# Patient Record
Sex: Female | Born: 1935 | Race: Black or African American | Hispanic: No | Marital: Single | Smoking: Former smoker
Health system: Southern US, Community
[De-identification: ages and names within clinical notes are randomized; demographics above are authoritative.]

## PROBLEM LIST (undated history)

## (undated) DIAGNOSIS — R51 Headache: Secondary | ICD-10-CM

## (undated) DIAGNOSIS — F419 Anxiety disorder, unspecified: Secondary | ICD-10-CM

## (undated) DIAGNOSIS — R519 Headache, unspecified: Secondary | ICD-10-CM

## (undated) DIAGNOSIS — I10 Essential (primary) hypertension: Secondary | ICD-10-CM

## (undated) DIAGNOSIS — M1990 Unspecified osteoarthritis, unspecified site: Secondary | ICD-10-CM

## (undated) DIAGNOSIS — E119 Type 2 diabetes mellitus without complications: Secondary | ICD-10-CM

## (undated) DIAGNOSIS — E7800 Pure hypercholesterolemia, unspecified: Secondary | ICD-10-CM

## (undated) HISTORY — PX: OTHER SURGICAL HISTORY: SHX169

## (undated) HISTORY — PX: CARDIAC CATHETERIZATION: SHX172

---

## 1998-02-11 ENCOUNTER — Emergency Department (HOSPITAL_COMMUNITY): Admission: EM | Admit: 1998-02-11 | Discharge: 1998-02-11 | Payer: Self-pay | Admitting: Emergency Medicine

## 1998-02-22 ENCOUNTER — Encounter: Admission: RE | Admit: 1998-02-22 | Discharge: 1998-05-23 | Payer: Self-pay | Admitting: Family Medicine

## 1998-03-20 ENCOUNTER — Emergency Department (HOSPITAL_COMMUNITY): Admission: EM | Admit: 1998-03-20 | Discharge: 1998-03-20 | Payer: Self-pay | Admitting: Emergency Medicine

## 2000-10-20 ENCOUNTER — Encounter: Payer: Self-pay | Admitting: Emergency Medicine

## 2000-10-20 ENCOUNTER — Emergency Department (HOSPITAL_COMMUNITY): Admission: EM | Admit: 2000-10-20 | Discharge: 2000-10-20 | Payer: Self-pay | Admitting: Emergency Medicine

## 2005-05-25 ENCOUNTER — Emergency Department (HOSPITAL_COMMUNITY): Admission: EM | Admit: 2005-05-25 | Discharge: 2005-05-25 | Payer: Self-pay | Admitting: Emergency Medicine

## 2005-06-26 ENCOUNTER — Emergency Department (HOSPITAL_COMMUNITY): Admission: EM | Admit: 2005-06-26 | Discharge: 2005-06-26 | Payer: Self-pay | Admitting: Family Medicine

## 2005-06-29 ENCOUNTER — Emergency Department (HOSPITAL_COMMUNITY): Admission: EM | Admit: 2005-06-29 | Discharge: 2005-06-29 | Payer: Self-pay | Admitting: Family Medicine

## 2005-09-07 ENCOUNTER — Emergency Department (HOSPITAL_COMMUNITY): Admission: EM | Admit: 2005-09-07 | Discharge: 2005-09-07 | Payer: Self-pay | Admitting: Family Medicine

## 2006-01-03 ENCOUNTER — Emergency Department (HOSPITAL_COMMUNITY): Admission: EM | Admit: 2006-01-03 | Discharge: 2006-01-03 | Payer: Self-pay | Admitting: Emergency Medicine

## 2006-06-19 ENCOUNTER — Emergency Department (HOSPITAL_COMMUNITY): Admission: EM | Admit: 2006-06-19 | Discharge: 2006-06-19 | Payer: Self-pay | Admitting: Emergency Medicine

## 2006-08-17 ENCOUNTER — Emergency Department (HOSPITAL_COMMUNITY): Admission: EM | Admit: 2006-08-17 | Discharge: 2006-08-17 | Payer: Self-pay | Admitting: Family Medicine

## 2006-08-18 ENCOUNTER — Emergency Department (HOSPITAL_COMMUNITY): Admission: EM | Admit: 2006-08-18 | Discharge: 2006-08-18 | Payer: Self-pay | Admitting: Emergency Medicine

## 2006-08-19 ENCOUNTER — Encounter (INDEPENDENT_AMBULATORY_CARE_PROVIDER_SITE_OTHER): Payer: Self-pay | Admitting: *Deleted

## 2006-08-19 ENCOUNTER — Ambulatory Visit (HOSPITAL_COMMUNITY): Admission: RE | Admit: 2006-08-19 | Discharge: 2006-08-19 | Payer: Self-pay | Admitting: Emergency Medicine

## 2006-08-19 ENCOUNTER — Ambulatory Visit: Payer: Self-pay | Admitting: *Deleted

## 2006-11-29 ENCOUNTER — Ambulatory Visit: Payer: Self-pay | Admitting: *Deleted

## 2006-11-29 ENCOUNTER — Emergency Department (HOSPITAL_COMMUNITY): Admission: EM | Admit: 2006-11-29 | Discharge: 2006-11-29 | Payer: Self-pay | Admitting: Emergency Medicine

## 2006-12-20 ENCOUNTER — Encounter: Admission: RE | Admit: 2006-12-20 | Discharge: 2006-12-20 | Payer: Self-pay | Admitting: Orthopedic Surgery

## 2007-01-07 ENCOUNTER — Encounter: Admission: RE | Admit: 2007-01-07 | Discharge: 2007-01-07 | Payer: Self-pay | Admitting: Orthopedic Surgery

## 2007-01-15 ENCOUNTER — Encounter: Admission: RE | Admit: 2007-01-15 | Discharge: 2007-01-15 | Payer: Self-pay | Admitting: Orthopedic Surgery

## 2007-03-11 ENCOUNTER — Emergency Department (HOSPITAL_COMMUNITY): Admission: EM | Admit: 2007-03-11 | Discharge: 2007-03-11 | Payer: Self-pay | Admitting: Emergency Medicine

## 2007-07-13 ENCOUNTER — Emergency Department (HOSPITAL_COMMUNITY): Admission: EM | Admit: 2007-07-13 | Discharge: 2007-07-13 | Payer: Self-pay | Admitting: Emergency Medicine

## 2007-08-25 ENCOUNTER — Emergency Department (HOSPITAL_COMMUNITY): Admission: EM | Admit: 2007-08-25 | Discharge: 2007-08-25 | Payer: Self-pay | Admitting: Emergency Medicine

## 2007-12-09 ENCOUNTER — Emergency Department (HOSPITAL_BASED_OUTPATIENT_CLINIC_OR_DEPARTMENT_OTHER): Admission: EM | Admit: 2007-12-09 | Discharge: 2007-12-09 | Payer: Self-pay | Admitting: Emergency Medicine

## 2007-12-16 ENCOUNTER — Emergency Department (HOSPITAL_BASED_OUTPATIENT_CLINIC_OR_DEPARTMENT_OTHER): Admission: EM | Admit: 2007-12-16 | Discharge: 2007-12-16 | Payer: Self-pay | Admitting: Emergency Medicine

## 2008-01-20 ENCOUNTER — Emergency Department (HOSPITAL_COMMUNITY): Admission: EM | Admit: 2008-01-20 | Discharge: 2008-01-20 | Payer: Self-pay | Admitting: Emergency Medicine

## 2008-01-22 ENCOUNTER — Encounter: Admission: RE | Admit: 2008-01-22 | Discharge: 2008-01-22 | Payer: Self-pay | Admitting: Orthopedic Surgery

## 2008-06-26 ENCOUNTER — Emergency Department (HOSPITAL_COMMUNITY): Admission: EM | Admit: 2008-06-26 | Discharge: 2008-06-26 | Payer: Self-pay | Admitting: Emergency Medicine

## 2008-07-23 ENCOUNTER — Encounter: Admission: RE | Admit: 2008-07-23 | Discharge: 2008-07-23 | Payer: Self-pay | Admitting: Orthopedic Surgery

## 2008-12-23 ENCOUNTER — Emergency Department (HOSPITAL_COMMUNITY): Admission: EM | Admit: 2008-12-23 | Discharge: 2008-12-24 | Payer: Self-pay | Admitting: Emergency Medicine

## 2009-01-29 ENCOUNTER — Emergency Department (HOSPITAL_COMMUNITY): Admission: EM | Admit: 2009-01-29 | Discharge: 2009-01-30 | Payer: Self-pay | Admitting: Emergency Medicine

## 2009-03-15 ENCOUNTER — Emergency Department (HOSPITAL_COMMUNITY): Admission: EM | Admit: 2009-03-15 | Discharge: 2009-03-15 | Payer: Self-pay | Admitting: Emergency Medicine

## 2009-03-29 ENCOUNTER — Emergency Department (HOSPITAL_COMMUNITY): Admission: EM | Admit: 2009-03-29 | Discharge: 2009-03-29 | Payer: Self-pay | Admitting: Emergency Medicine

## 2009-06-07 ENCOUNTER — Emergency Department (HOSPITAL_COMMUNITY): Admission: EM | Admit: 2009-06-07 | Discharge: 2009-06-07 | Payer: Self-pay | Admitting: Emergency Medicine

## 2009-08-23 ENCOUNTER — Emergency Department (HOSPITAL_COMMUNITY): Admission: EM | Admit: 2009-08-23 | Discharge: 2009-08-24 | Payer: Self-pay | Admitting: Emergency Medicine

## 2009-10-01 ENCOUNTER — Emergency Department (HOSPITAL_COMMUNITY): Admission: EM | Admit: 2009-10-01 | Discharge: 2009-10-01 | Payer: Self-pay | Admitting: Emergency Medicine

## 2009-10-31 ENCOUNTER — Inpatient Hospital Stay (HOSPITAL_COMMUNITY): Admission: EM | Admit: 2009-10-31 | Discharge: 2009-11-02 | Payer: Self-pay | Admitting: Emergency Medicine

## 2010-08-21 LAB — BLOOD GAS, VENOUS
Acid-Base Excess: 0.2 mmol/L (ref 0.0–2.0)
O2 Saturation: 78 %
TCO2: 22.3 mmol/L (ref 0–100)
pO2, Ven: 45.8 mmHg — ABNORMAL HIGH (ref 30.0–45.0)

## 2010-08-21 LAB — DIFFERENTIAL
Eosinophils Absolute: 0.2 10*3/uL (ref 0.0–0.7)
Monocytes Relative: 7 % (ref 3–12)
Neutrophils Relative %: 43 % (ref 43–77)

## 2010-08-21 LAB — COMPREHENSIVE METABOLIC PANEL
ALT: 13 U/L (ref 0–35)
ALT: 17 U/L (ref 0–35)
AST: 20 U/L (ref 0–37)
Albumin: 3.6 g/dL (ref 3.5–5.2)
Alkaline Phosphatase: 59 U/L (ref 39–117)
BUN: 26 mg/dL — ABNORMAL HIGH (ref 6–23)
CO2: 22 mEq/L (ref 19–32)
CO2: 24 mEq/L (ref 19–32)
Calcium: 8.9 mg/dL (ref 8.4–10.5)
GFR calc non Af Amer: 36 mL/min — ABNORMAL LOW (ref >60)
Glucose, Bld: 285 mg/dL — ABNORMAL HIGH (ref 70–99)
Potassium: 3.7 mEq/L (ref 3.5–5.1)
Sodium: 135 mEq/L (ref 135–145)
Total Bilirubin: 0.7 mg/dL (ref 0.3–1.2)
Total Bilirubin: 0.8 mg/dL (ref 0.3–1.2)
Total Protein: 6 g/dL (ref 6.0–8.3)

## 2010-08-21 LAB — PROTIME-INR: INR: 0.91 (ref 0.00–1.49)

## 2010-08-21 LAB — LIPID PANEL
Total CHOL/HDL Ratio: 6 RATIO
VLDL: 32 mg/dL (ref 0–40)

## 2010-08-21 LAB — URINALYSIS, ROUTINE W REFLEX MICROSCOPIC
Glucose, UA: >1000 mg/dL — AB
Hgb urine dipstick: NEGATIVE
Leukocytes, UA: NEGATIVE
Specific Gravity, Urine: 1.025 (ref 1.005–1.030)

## 2010-08-21 LAB — GLUCOSE, CAPILLARY
Glucose-Capillary: 257 mg/dL — ABNORMAL HIGH (ref 70–99)
Glucose-Capillary: 272 mg/dL — ABNORMAL HIGH (ref 70–99)
Glucose-Capillary: 278 mg/dL — ABNORMAL HIGH (ref 70–99)
Glucose-Capillary: 478 mg/dL — ABNORMAL HIGH (ref 70–99)
Glucose-Capillary: 82 mg/dL (ref 70–99)
Glucose-Capillary: 95 mg/dL (ref 70–99)

## 2010-08-21 LAB — POCT I-STAT, CHEM 8
Calcium, Ion: 1.01 mmol/L — ABNORMAL LOW (ref 1.12–1.32)
Creatinine, Ser: 1.9 mg/dL — ABNORMAL HIGH (ref 0.4–1.2)
Glucose, Bld: 432 mg/dL — ABNORMAL HIGH (ref 70–99)
HCT: 42 % (ref 36.0–46.0)
Hemoglobin: 14.3 g/dL (ref 12.0–15.0)
Potassium: 3.8 mEq/L (ref 3.5–5.1)
TCO2: 23 mmol/L (ref 0–100)

## 2010-08-21 LAB — CBC
HCT: 33.7 % — ABNORMAL LOW (ref 36.0–46.0)
HCT: 38.7 % (ref 36.0–46.0)
Hemoglobin: 11.1 g/dL — ABNORMAL LOW (ref 12.0–15.0)
Hemoglobin: 13 g/dL (ref 12.0–15.0)
MCHC: 33.6 g/dL (ref 30.0–36.0)
MCV: 88.6 fL (ref 78.0–100.0)
RBC: 3.74 MIL/uL — ABNORMAL LOW (ref 3.87–5.11)
RDW: 14.2 % (ref 11.5–15.5)
WBC: 11.4 10*3/uL — ABNORMAL HIGH (ref 4.0–10.5)

## 2010-08-21 LAB — BASIC METABOLIC PANEL
CO2: 24 mEq/L (ref 19–32)
Chloride: 107 mEq/L (ref 96–112)
Creatinine, Ser: 1.24 mg/dL — ABNORMAL HIGH (ref 0.4–1.2)
GFR calc Af Amer: 51 mL/min — ABNORMAL LOW (ref >60)
Sodium: 136 mEq/L (ref 135–145)

## 2010-08-21 LAB — URINE CULTURE: Colony Count: 9000

## 2010-08-21 LAB — URINE MICROSCOPIC-ADD ON: Urine-Other: NONE SEEN

## 2010-08-21 LAB — HEMOGLOBIN A1C
Hgb A1c MFr Bld: 11 % — ABNORMAL HIGH (ref <5.7)
Mean Plasma Glucose: 269 mg/dL — ABNORMAL HIGH (ref <117)

## 2010-08-21 LAB — APTT: aPTT: 35 seconds (ref 24–37)

## 2010-08-22 LAB — GLUCOSE, CAPILLARY

## 2010-08-22 LAB — URIC ACID: Uric Acid, Serum: 9.8 mg/dL — ABNORMAL HIGH (ref 2.4–7.0)

## 2010-08-27 LAB — GLUCOSE, CAPILLARY

## 2010-09-07 LAB — CBC
HCT: 35.7 % — ABNORMAL LOW (ref 36.0–46.0)
HCT: 33.7 % — ABNORMAL LOW (ref 36.0–46.0)
Hemoglobin: 11.9 g/dL — ABNORMAL LOW (ref 12.0–15.0)
Hemoglobin: 11.3 g/dL — ABNORMAL LOW (ref 12.0–15.0)
MCHC: 33.5 g/dL (ref 30.0–36.0)
MCV: 90.6 fL (ref 78.0–100.0)
MCV: 90.7 fL (ref 78.0–100.0)
Platelets: 194 10*3/uL (ref 150–400)
RBC: 3.72 MIL/uL — ABNORMAL LOW (ref 3.87–5.11)
RDW: 14.7 % (ref 11.5–15.5)

## 2010-09-07 LAB — URINALYSIS, ROUTINE W REFLEX MICROSCOPIC
Bilirubin Urine: NEGATIVE
Bilirubin Urine: NEGATIVE
Ketones, ur: NEGATIVE mg/dL
Ketones, ur: NEGATIVE mg/dL
Nitrite: NEGATIVE
Nitrite: NEGATIVE
Protein, ur: NEGATIVE mg/dL
Specific Gravity, Urine: 1.02 (ref 1.005–1.030)
Urobilinogen, UA: 0.2 mg/dL (ref 0.0–1.0)
Urobilinogen, UA: 1 mg/dL (ref 0.0–1.0)

## 2010-09-07 LAB — COMPREHENSIVE METABOLIC PANEL
ALT: 11 U/L (ref 0–35)
BUN: 21 mg/dL (ref 6–23)
CO2: 28 mEq/L (ref 19–32)
Calcium: 8.8 mg/dL (ref 8.4–10.5)
GFR calc non Af Amer: 46 mL/min — ABNORMAL LOW (ref >60)
Glucose, Bld: 113 mg/dL — ABNORMAL HIGH (ref 70–99)
Sodium: 139 mEq/L (ref 135–145)

## 2010-09-07 LAB — URINE MICROSCOPIC-ADD ON

## 2010-09-07 LAB — URINE CULTURE: Culture: NO GROWTH

## 2010-09-07 LAB — DIFFERENTIAL
Basophils Absolute: 0 10*3/uL (ref 0.0–0.1)
Basophils Relative: 0 % (ref 0–1)
Eosinophils Absolute: 0.3 10*3/uL (ref 0.0–0.7)
Eosinophils Absolute: 0.3 10*3/uL (ref 0.0–0.7)
Eosinophils Relative: 4 % (ref 0–5)
Lymphocytes Relative: 44 % (ref 12–46)
Lymphs Abs: 3 10*3/uL (ref 0.7–4.0)
Lymphs Abs: 4.2 10*3/uL — ABNORMAL HIGH (ref 0.7–4.0)
Monocytes Absolute: 0.4 10*3/uL (ref 0.1–1.0)
Neutro Abs: 4.4 10*3/uL (ref 1.7–7.7)
Neutrophils Relative %: 46 % (ref 43–77)

## 2010-09-07 LAB — POCT I-STAT, CHEM 8
BUN: 31 mg/dL — ABNORMAL HIGH (ref 6–23)
Calcium, Ion: 1.18 mmol/L (ref 1.12–1.32)
Creatinine, Ser: 1.3 mg/dL — ABNORMAL HIGH (ref 0.4–1.2)
Hemoglobin: 12.6 g/dL (ref 12.0–15.0)
Sodium: 139 mEq/L (ref 135–145)
TCO2: 27 mmol/L (ref 0–100)

## 2010-09-07 LAB — POCT CARDIAC MARKERS: Myoglobin, poc: 68.7 ng/mL (ref 12–200)

## 2010-09-09 LAB — DIFFERENTIAL
Eosinophils Relative: 3 % (ref 0–5)
Lymphocytes Relative: 49 % — ABNORMAL HIGH (ref 12–46)
Lymphs Abs: 3.9 10*3/uL (ref 0.7–4.0)
Monocytes Absolute: 0.5 10*3/uL (ref 0.1–1.0)
Monocytes Relative: 6 % (ref 3–12)

## 2010-09-09 LAB — URINALYSIS, ROUTINE W REFLEX MICROSCOPIC
Glucose, UA: NEGATIVE mg/dL
Hgb urine dipstick: NEGATIVE
Ketones, ur: NEGATIVE mg/dL
Protein, ur: NEGATIVE mg/dL

## 2010-09-09 LAB — CBC
HCT: 36.5 % (ref 36.0–46.0)
Hemoglobin: 12.2 g/dL (ref 12.0–15.0)
WBC: 8.1 10*3/uL (ref 4.0–10.5)

## 2010-09-09 LAB — POCT I-STAT, CHEM 8
BUN: 30 mg/dL — ABNORMAL HIGH (ref 6–23)
Calcium, Ion: 1.14 mmol/L (ref 1.12–1.32)
Creatinine, Ser: 1.6 mg/dL — ABNORMAL HIGH (ref 0.4–1.2)
TCO2: 27 mmol/L (ref 0–100)

## 2010-09-09 LAB — URINE CULTURE

## 2010-09-09 LAB — URINE MICROSCOPIC-ADD ON

## 2010-09-10 LAB — URINALYSIS, ROUTINE W REFLEX MICROSCOPIC
Hgb urine dipstick: NEGATIVE
Protein, ur: NEGATIVE mg/dL
Urobilinogen, UA: 0.2 mg/dL (ref 0.0–1.0)

## 2010-09-10 LAB — URINE MICROSCOPIC-ADD ON

## 2010-09-10 LAB — POCT I-STAT, CHEM 8
BUN: 30 mg/dL — ABNORMAL HIGH (ref 6–23)
Chloride: 107 mEq/L (ref 96–112)
Creatinine, Ser: 1.3 mg/dL — ABNORMAL HIGH (ref 0.4–1.2)
HCT: 38 % (ref 36.0–46.0)
Potassium: 3.5 mEq/L (ref 3.5–5.1)
Sodium: 141 mEq/L (ref 135–145)

## 2010-09-10 LAB — DIFFERENTIAL
Lymphocytes Relative: 52 % — ABNORMAL HIGH (ref 12–46)
Lymphs Abs: 4.3 10*3/uL — ABNORMAL HIGH (ref 0.7–4.0)
Neutro Abs: 3.1 10*3/uL (ref 1.7–7.7)
Neutrophils Relative %: 37 % — ABNORMAL LOW (ref 43–77)

## 2010-09-10 LAB — CBC
HCT: 36.1 % (ref 36.0–46.0)
Platelets: 206 10*3/uL (ref 150–400)
WBC: 8.4 10*3/uL (ref 4.0–10.5)

## 2010-09-10 LAB — URINE CULTURE

## 2010-09-10 LAB — GLUCOSE, CAPILLARY: Glucose-Capillary: 92 mg/dL (ref 70–99)

## 2010-09-18 LAB — URINALYSIS, ROUTINE W REFLEX MICROSCOPIC
Ketones, ur: NEGATIVE mg/dL
Nitrite: NEGATIVE
Protein, ur: NEGATIVE mg/dL

## 2010-09-18 LAB — BASIC METABOLIC PANEL
BUN: 26 mg/dL — ABNORMAL HIGH (ref 6–23)
Creatinine, Ser: 1.16 mg/dL (ref 0.4–1.2)
GFR calc non Af Amer: 46 mL/min — ABNORMAL LOW (ref >60)
Glucose, Bld: 105 mg/dL — ABNORMAL HIGH (ref 70–99)
Potassium: 3.8 mEq/L (ref 3.5–5.1)

## 2010-09-18 LAB — CBC
HCT: 37 % (ref 36.0–46.0)
MCV: 89.2 fL (ref 78.0–100.0)
Platelets: 234 10*3/uL (ref 150–400)
RDW: 15.4 % (ref 11.5–15.5)

## 2010-09-18 LAB — DIFFERENTIAL
Basophils Absolute: 0 10*3/uL (ref 0.0–0.1)
Eosinophils Absolute: 0.1 10*3/uL (ref 0.0–0.7)
Eosinophils Relative: 2 % (ref 0–5)
Lymphocytes Relative: 39 % (ref 12–46)

## 2011-03-01 LAB — CBC
Platelets: 231
RDW: 14.1

## 2011-03-01 LAB — DIFFERENTIAL
Basophils Absolute: 0
Lymphocytes Relative: 33
Neutro Abs: 5.1

## 2011-03-21 LAB — BASIC METABOLIC PANEL
Chloride: 104
Creatinine, Ser: 1.09
GFR calc Af Amer: 60 — ABNORMAL LOW
Potassium: 3.7

## 2011-03-21 LAB — CBC
HCT: 34.5 — ABNORMAL LOW
MCV: 86.2
RBC: 4
WBC: 9.6

## 2011-03-21 LAB — DIFFERENTIAL
Eosinophils Absolute: 0.2
Lymphocytes Relative: 33
Lymphs Abs: 3.1
Monocytes Relative: 10
Neutrophils Relative %: 56

## 2012-01-03 ENCOUNTER — Other Ambulatory Visit: Payer: Self-pay | Admitting: Family Medicine

## 2012-01-03 DIAGNOSIS — Z780 Asymptomatic menopausal state: Secondary | ICD-10-CM

## 2012-01-03 DIAGNOSIS — Z1231 Encounter for screening mammogram for malignant neoplasm of breast: Secondary | ICD-10-CM

## 2012-01-29 ENCOUNTER — Ambulatory Visit
Admission: RE | Admit: 2012-01-29 | Discharge: 2012-01-29 | Disposition: A | Discharge status: Home / Self Care | Payer: Medicare Other | Source: Ambulatory Visit | Attending: Family Medicine | Admitting: Family Medicine

## 2012-01-29 DIAGNOSIS — Z1231 Encounter for screening mammogram for malignant neoplasm of breast: Secondary | ICD-10-CM

## 2012-01-29 DIAGNOSIS — Z780 Asymptomatic menopausal state: Secondary | ICD-10-CM

## 2013-02-01 ENCOUNTER — Emergency Department (HOSPITAL_COMMUNITY)
Admission: EM | Admit: 2013-02-01 | Discharge: 2013-02-01 | Disposition: A | Discharge status: Home / Self Care | Payer: PRIVATE HEALTH INSURANCE | Attending: Emergency Medicine | Admitting: Emergency Medicine

## 2013-02-01 ENCOUNTER — Emergency Department (HOSPITAL_COMMUNITY): Payer: PRIVATE HEALTH INSURANCE

## 2013-02-01 ENCOUNTER — Encounter (HOSPITAL_COMMUNITY): Payer: Self-pay | Admitting: Emergency Medicine

## 2013-02-01 DIAGNOSIS — G8929 Other chronic pain: Secondary | ICD-10-CM | POA: Insufficient documentation

## 2013-02-01 DIAGNOSIS — R51 Headache: Secondary | ICD-10-CM | POA: Insufficient documentation

## 2013-02-01 DIAGNOSIS — M545 Low back pain, unspecified: Secondary | ICD-10-CM | POA: Insufficient documentation

## 2013-02-01 DIAGNOSIS — IMO0002 Reserved for concepts with insufficient information to code with codable children: Secondary | ICD-10-CM | POA: Insufficient documentation

## 2013-02-01 DIAGNOSIS — Z79899 Other long term (current) drug therapy: Secondary | ICD-10-CM | POA: Insufficient documentation

## 2013-02-01 DIAGNOSIS — Z8679 Personal history of other diseases of the circulatory system: Secondary | ICD-10-CM | POA: Insufficient documentation

## 2013-02-01 DIAGNOSIS — Z880 Allergy status to penicillin: Secondary | ICD-10-CM | POA: Insufficient documentation

## 2013-02-01 DIAGNOSIS — Z87891 Personal history of nicotine dependence: Secondary | ICD-10-CM | POA: Insufficient documentation

## 2013-02-01 DIAGNOSIS — Z794 Long term (current) use of insulin: Secondary | ICD-10-CM | POA: Insufficient documentation

## 2013-02-01 LAB — SEDIMENTATION RATE: Sed Rate: 36 mm/hr — ABNORMAL HIGH (ref 0–22)

## 2013-02-01 LAB — GLUCOSE, CAPILLARY: Glucose-Capillary: 106 mg/dL — ABNORMAL HIGH (ref 70–99)

## 2013-02-01 MED ORDER — METHYLPREDNISOLONE SODIUM SUCC 125 MG IJ SOLR
125.0000 mg | Freq: Once | INTRAMUSCULAR | Status: AC
  Administered 2013-02-01: 125 mg via INTRAMUSCULAR
  Filled 2013-02-01: qty 2

## 2013-02-01 MED ORDER — PREDNISONE 20 MG PO TABS: 40.0000 mg | ORAL_TABLET | Freq: Every day | ORAL | Status: DC

## 2013-02-01 MED ORDER — OXYCODONE-ACETAMINOPHEN 5-325 MG PO TABS
1.0000 | ORAL_TABLET | Freq: Once | ORAL | Status: AC
  Administered 2013-02-01: 1 via ORAL
  Filled 2013-02-01: qty 1

## 2013-02-01 NOTE — ED Notes (Signed)
Bed: ZO10 Expected date:  Expected time:  Means of arrival:  Comments: EMS 78yo F, gen weakness

## 2013-02-01 NOTE — ED Provider Notes (Signed)
CSN: [id number]     Arrival date & time 02/01/13  0534 History   First MD Initiated Contact with Patient 02/01/13 0601     Chief Complaint  Patient presents with  . Headache  . Back Pain   (Consider location/radiation/quality/duration/timing/severity/associated sxs/prior Treatment) HPI Comments: Patient presents with complaint of 2 days of left sided headache, worse this morning. Patient has a history of migraines and generalized headaches however this morning she developed a new type of headache in the left temporal area that radiates to the left cheek. Pain is sharp. It is not associated with phonophobia, photophobia, jaw claudication. She has not had fever or neck pain. She is not having difficulty moving her neck. Onset of headache was gradual. She has no vision change or stroke-like symptoms. No toothache or sinus problems. She typically takes Percocet for chronic back pain and headaches. She has needed to come to the emergency department in the past for headaches when she was younger. She also complains of worsening of her lower chronic back pain. She denies red flag signs and symptoms of lower back pain. Pain does not radiate into her legs. She is not having difficulty walking.   The history is provided by the patient.    History reviewed. No pertinent past medical history. History reviewed. No pertinent past surgical history. History reviewed. No pertinent family history. History  Substance Use Topics  . Smoking status: Former Games developer  . Smokeless tobacco: Not on file  . Alcohol Use: No   OB History   Grav Para Term Preterm Abortions TAB SAB Ect Mult Living                 Review of Systems  Constitutional: Negative for fever and unexpected weight change.  HENT: Negative for congestion, rhinorrhea, neck pain, neck stiffness, dental problem and sinus pressure.   Eyes: Negative for photophobia, discharge, redness and visual disturbance.  Respiratory: Negative for shortness of  breath.   Cardiovascular: Negative for chest pain.  Gastrointestinal: Negative for nausea, vomiting and constipation.       Negative for fecal incontinence.   Genitourinary: Negative for dysuria, hematuria, flank pain, vaginal bleeding, vaginal discharge and pelvic pain.       Negative for urinary incontinence or retention.  Musculoskeletal: Positive for back pain. Negative for gait problem.  Skin: Negative for rash.  Neurological: Positive for headaches. Negative for syncope, speech difficulty, weakness, light-headedness and numbness.       Denies saddle paresthesias.  Psychiatric/Behavioral: Negative for confusion.    Allergies  Penicillins  Home Medications   Current Outpatient Rx  Name  Route  Sig  Dispense  Refill  . ALPRAZolam (XANAX) 0.5 MG tablet   Oral   Take 0.5 mg by mouth every 8 (eight) hours as needed for anxiety.         Marland Kitchen amLODipine-benazepril (LOTREL) 10-40 MG per capsule   Oral   Take 1 capsule by mouth every morning.         . colchicine 0.6 MG tablet   Oral   Take 0.6 mg by mouth 2 (two) times daily.         Marland Kitchen glipiZIDE (GLUCOTROL) 10 MG tablet   Oral   Take 10 mg by mouth every morning.         . insulin glargine (LANTUS) 100 UNIT/ML injection   Subcutaneous   Inject 15-25 Units into the skin 2 (two) times daily. Inject 25 units in the morning and 15 units in  the evening         . metoprolol (LOPRESSOR) 50 MG tablet   Oral   Take 50 mg by mouth 2 (two) times daily.         . naproxen sodium (ANAPROX) 220 MG tablet   Oral   Take 220 mg by mouth 2 (two) times daily as needed (pain).         Marland Kitchen oxyCODONE-acetaminophen (PERCOCET/ROXICET) 5-325 MG per tablet   Oral   Take 1 tablet by mouth every 4 (four) hours as needed for pain.         . pantoprazole (PROTONIX) 40 MG tablet   Oral   Take 40 mg by mouth every morning.         . simvastatin (ZOCOR) 20 MG tablet   Oral   Take 20 mg by mouth every evening.         .  triamterene-hydrochlorothiazide (MAXZIDE-25) 37.5-25 MG per tablet   Oral   Take 1 tablet by mouth every morning.         . predniSONE (DELTASONE) 20 MG tablet   Oral   Take 2 tablets (40 mg total) by mouth daily. Starting 02/02/2013   30 tablet   0    BP 145/64  Pulse 62  Temp(Src) 98.3 F (36.8 C) (Oral)  Resp 20  SpO2 100% Physical Exam  Nursing note and vitals reviewed. Constitutional: She is oriented to person, place, and time. She appears well-developed and well-nourished.  HENT:  Head: Normocephalic and atraumatic.  Right Ear: Tympanic membrane, external ear and ear canal normal.  Left Ear: Tympanic membrane, external ear and ear canal normal.  Nose: Nose normal.  Mouth/Throat: Uvula is midline, oropharynx is clear and moist and mucous membranes are normal.  Mild tenderness over L temporal area, no pulsations or bruits.   Eyes: Conjunctivae, EOM and lids are normal. Pupils are equal, round, and reactive to light. Right eye exhibits no nystagmus. Left eye exhibits no nystagmus.  Neck: Normal range of motion. Neck supple.  Cardiovascular: Normal rate and regular rhythm.   Pulmonary/Chest: Effort normal and breath sounds normal.  Abdominal: Soft. There is no tenderness. There is no CVA tenderness.  Musculoskeletal: Normal range of motion.       Cervical back: She exhibits normal range of motion, no tenderness and no bony tenderness.  No step-off noted with palpation of spine.   Neurological: She is alert and oriented to person, place, and time. She has normal strength and normal reflexes. No cranial nerve deficit or sensory deficit. She displays a negative Romberg sign. Coordination and gait normal. GCS eye subscore is 4. GCS verbal subscore is 5. GCS motor subscore is 6.  5/5 strength in entire lower extremities bilaterally. No sensation deficit.   Skin: Skin is warm and dry. No rash noted.  Psychiatric: She has a normal mood and affect.    ED Course  Procedures  (including critical care time) Labs Review Labs Reviewed  SEDIMENTATION RATE - Abnormal; Notable for the following:    Sed Rate 36 (*)    All other components within normal limits   Imaging Review Dg Lumbar Spine Complete  02/01/2013   *RADIOLOGY REPORT*  Clinical Data: Low back pain  LUMBAR SPINE - COMPLETE 4+ VIEW  Comparison: None.  Findings: Five lumbar type vertebral bodies are well visualized. Vertebral body height is well-maintained.  No spondylolysis or spondylolisthesis is seen.  Mild osteophytic changes are seen.  IMPRESSION: Mild degenerative change without acute  abnormality.   Original Report Authenticated By: Alcide Clever, M.D.   Ct Head Wo Contrast  02/01/2013   *RADIOLOGY REPORT*  Clinical Data: Headaches  CT HEAD WITHOUT CONTRAST  Technique:  Contiguous axial images were obtained from the base of the skull through the vertex without contrast.  Comparison: 03/29/2009  Findings: The bony calvarium is intact.  No acute hemorrhage, acute infarction or space-occupying mass lesion is identified.  IMPRESSION: No acute abnormality noted.   Original Report Authenticated By: Alcide Clever, M.D.    6:32 AM Patient seen and examined. Work-up initiated. Medications ordered.   Vital signs reviewed and are as follows: Filed Vitals:   02/01/13 0544  BP: 145/64  Pulse: 62  Temp: 98.3 F (36.8 C)  Resp: 20   Pt d/w and seen by Dr. Jeraldine Loots. Tx for temporal arteritis initiated as ESR elevated, although mild.   Pt informed of all results. Pt states she is seeing a new PCP on 09/04. I have urged her to f/u with them. Also, neuro referral given.   9:23 AM Patient was counseled on symptoms that should indicate their return to the ED.  These include severe worsening headache, vision changes, confusion, loss of consciousness, trouble walking, nausea & vomiting, or weakness/tingling in extremities.    Counseled on need to check blood sugars frequently while using prednisone and d/c if >300.     MDM   1. Headache    HA: CT neg, symptoms concerning for temporal arteritis, however no concerning vision sx or neuro symptoms. Pt has PCP f/u.   Back pain: chronic, stable, continue home meds. X-ray with no acute findings.      Renne Crigler, PA-C 02/01/13 [phone]

## 2013-02-01 NOTE — ED Notes (Signed)
Pt arrived to the Ed via EMS with a complaint of a headache that has lasted two days with out relief of medication.  Pt states that it hurts on the forehead and radiates down her left face to her cheek bone.  Pt states that the headache has been intermittent.  Pt also complains of back pain that has "been going on for years," but has just recently gotten to a point that it has increased in pain enough to cause concern.

## 2013-02-03 NOTE — ED Provider Notes (Signed)
Medical screening examination/treatment/procedure(s) were performed by non-physician practitioner and as supervising physician I was immediately available for consultation/collaboration.  Olivia Mackie, MD 02/03/13 2031

## 2013-05-04 ENCOUNTER — Emergency Department (HOSPITAL_COMMUNITY)
Admission: EM | Admit: 2013-05-04 | Discharge: 2013-05-04 | Disposition: A | Discharge status: Home / Self Care | Payer: PRIVATE HEALTH INSURANCE | Attending: Emergency Medicine | Admitting: Emergency Medicine

## 2013-05-04 ENCOUNTER — Emergency Department (HOSPITAL_COMMUNITY): Payer: PRIVATE HEALTH INSURANCE

## 2013-05-04 ENCOUNTER — Encounter (HOSPITAL_COMMUNITY): Payer: Self-pay | Admitting: Emergency Medicine

## 2013-05-04 DIAGNOSIS — Z87891 Personal history of nicotine dependence: Secondary | ICD-10-CM | POA: Insufficient documentation

## 2013-05-04 DIAGNOSIS — R05 Cough: Secondary | ICD-10-CM | POA: Insufficient documentation

## 2013-05-04 DIAGNOSIS — F411 Generalized anxiety disorder: Secondary | ICD-10-CM | POA: Insufficient documentation

## 2013-05-04 DIAGNOSIS — R059 Cough, unspecified: Secondary | ICD-10-CM | POA: Insufficient documentation

## 2013-05-04 DIAGNOSIS — I10 Essential (primary) hypertension: Secondary | ICD-10-CM | POA: Insufficient documentation

## 2013-05-04 DIAGNOSIS — E7800 Pure hypercholesterolemia, unspecified: Secondary | ICD-10-CM | POA: Insufficient documentation

## 2013-05-04 DIAGNOSIS — E119 Type 2 diabetes mellitus without complications: Secondary | ICD-10-CM | POA: Insufficient documentation

## 2013-05-04 DIAGNOSIS — R739 Hyperglycemia, unspecified: Secondary | ICD-10-CM

## 2013-05-04 DIAGNOSIS — Z794 Long term (current) use of insulin: Secondary | ICD-10-CM | POA: Insufficient documentation

## 2013-05-04 DIAGNOSIS — Z880 Allergy status to penicillin: Secondary | ICD-10-CM | POA: Insufficient documentation

## 2013-05-04 DIAGNOSIS — R197 Diarrhea, unspecified: Secondary | ICD-10-CM | POA: Insufficient documentation

## 2013-05-04 DIAGNOSIS — Z79899 Other long term (current) drug therapy: Secondary | ICD-10-CM | POA: Insufficient documentation

## 2013-05-04 HISTORY — DX: Type 2 diabetes mellitus without complications: E11.9

## 2013-05-04 HISTORY — DX: Pure hypercholesterolemia, unspecified: E78.00

## 2013-05-04 HISTORY — DX: Essential (primary) hypertension: I10

## 2013-05-04 HISTORY — DX: Anxiety disorder, unspecified: F41.9

## 2013-05-04 LAB — POTASSIUM: Potassium: 4.1 mEq/L (ref 3.5–5.1)

## 2013-05-04 LAB — BASIC METABOLIC PANEL
Calcium: 8.9 mg/dL (ref 8.4–10.5)
Chloride: 99 mEq/L (ref 96–112)
GFR calc Af Amer: 47 mL/min — ABNORMAL LOW (ref >90)
GFR calc non Af Amer: 41 mL/min — ABNORMAL LOW (ref >90)
Glucose, Bld: 390 mg/dL — ABNORMAL HIGH (ref 70–99)
Potassium: 5.5 mEq/L — ABNORMAL HIGH (ref 3.5–5.1)
Sodium: 130 mEq/L — ABNORMAL LOW (ref 135–145)

## 2013-05-04 LAB — URINE MICROSCOPIC-ADD ON

## 2013-05-04 LAB — URINALYSIS, ROUTINE W REFLEX MICROSCOPIC
Bilirubin Urine: NEGATIVE
Glucose, UA: 250 mg/dL — AB
Hgb urine dipstick: NEGATIVE
Ketones, ur: NEGATIVE mg/dL
Nitrite: NEGATIVE
Protein, ur: NEGATIVE mg/dL
Specific Gravity, Urine: 1.012 (ref 1.005–1.030)
Urobilinogen, UA: 0.2 mg/dL (ref 0.0–1.0)
pH: 5.5 (ref 5.0–8.0)

## 2013-05-04 LAB — CBC
HCT: 43.1 % (ref 36.0–46.0)
Hemoglobin: 14.4 g/dL (ref 12.0–15.0)
MCH: 28.4 pg (ref 26.0–34.0)
MCHC: 33.4 g/dL (ref 30.0–36.0)
Platelets: 207 10*3/uL (ref 150–400)
RDW: 14.2 % (ref 11.5–15.5)
WBC: 12.2 10*3/uL — ABNORMAL HIGH (ref 4.0–10.5)

## 2013-05-04 LAB — GLUCOSE, CAPILLARY: Glucose-Capillary: 364 mg/dL — ABNORMAL HIGH (ref 70–99)

## 2013-05-04 MED ORDER — SODIUM CHLORIDE 0.9 % IV BOLUS (SEPSIS)
500.0000 mL | INTRAVENOUS | Status: AC
  Administered 2013-05-04: 500 mL via INTRAVENOUS

## 2013-05-04 MED ORDER — INSULIN ASPART 100 UNIT/ML ~~LOC~~ SOLN
4.0000 [IU] | Freq: Once | SUBCUTANEOUS | Status: AC
  Administered 2013-05-04: 4 [IU] via INTRAVENOUS
  Filled 2013-05-04: qty 1

## 2013-05-04 NOTE — Progress Notes (Signed)
   CARE MANAGEMENT ED NOTE 05/04/2013  Patient:  Rhonda, Conrad   Account Number:  [id number]  Date Initiated:  05/04/2013  Documentation initiated by:  Edd Arbour  Subjective/Objective Assessment:   79 yr old female [facility] without pcp listed in epic Pt confirms she is seen at [facility] by [place] [facility]rape cbg 364, 368 NA 130 BUN 26 creat 1.23 wbc 12.2 given ns bolus x 1     Subjective/Objective Assessment Detail:   high blood sugar at home. Patient's blood sugar-437. Patient took 25 units of Lantus insulin 3 hours ago. Patient c/o abdominal pain, but states she has had a virus. Patient also c/o nausea and diarrhea.     Action/Plan:   EPIC updated   Action/Plan Detail:   Anticipated DC Date:       Status Recommendation to Physician:   Result of Recommendation:    Other ED Services  Consult Working Plan    DC Planning Services  Other  PCP issues    Choice offered to / List presented to:            Status of service:  Completed, signed off  ED Comments:   ED Comments Detail:

## 2013-05-04 NOTE — ED Notes (Signed)
Patient has been having high blood sugar at home. Patient's blood sugar-437. Patient took 25 units of Lantus insulin 3 hours ago. Patient c/o abdominal pain, but states she has had a virus. Patient also c/o nausea and diarrhea. Patient denies vomiting or blurred vision

## 2013-05-04 NOTE — ED Provider Notes (Signed)
Medical screening examination/treatment/procedure(s) were conducted as a shared visit with non-physician practitioner(s) and myself.  I personally evaluated the patient during the encounter.  EKG Interpretation    Date/Time:  Monday May 04 2013 18:06:15 EST Ventricular Rate:  68 PR Interval:  176 QRS Duration: 89 QT Interval:  392 QTC Calculation: 417 R Axis:   5 Text Interpretation:  Age not entered, assumed to be  79 years old for purpose of ECG interpretation Sinus rhythm No significant change was found Confirmed by [facility]  MD, TREY ([id number]) on 05/04/2013 6:43:58 PM            79 yo female presenting with hyperglycemia.  She reports well controlled blood sugar until today.  She is getting over a cold, but denies other acute symptoms.  She does report missing a dose of lantus last night.  On exam, well appearing, no distress, normal respirations, lungs CTAB, heart sounds normal with RRR, Abd soft and nontender. Given fluids and insulin.  Suspect that hyperglycemia secondary to missed dose of lantus.  However, lab workup pending to rule out other causes or acute complications.    Clinical Impression: 1. Hyperglycemia without ketosis       Candyce Churn, MD 05/05/13 [phone]

## 2013-05-04 NOTE — ED Notes (Signed)
Bed: WA09 Expected date:  Expected time:  Means of arrival:  Comments: [place]

## 2013-05-04 NOTE — ED Provider Notes (Signed)
CSN: [id number]     Arrival date & time 05/04/13  1420 History   First MD Initiated Contact with Patient 05/04/13 1656     Chief Complaint  Patient presents with  . Hyperglycemia   (Consider location/radiation/quality/duration/timing/severity/associated sxs/prior Treatment) HPI Patient presents to the Emergency Department with a chief complaint of hyperglycemia. Patient has DM II and is currently on 25 units of Lantus in the morning, 10 units of Lantus in the evening, and Glipizide prior to meals. Patient reports that she checked her blood sugar early this afternoon, and it was around 450. She reports that she took her Lantus around noon and her blood sugar usually runs around 150. She reports that after checking her sugar, she drank a bottle of water and came to the ED. She states that she had a few episodes of non-bloody diarrhea this morning. She also reports that she has had a cough for the past few days. She denies fever, chills, nausea, vomiting, chest pain, abdominal pain, and urinary symptoms. She states that she is compliant with her medications, but usually misses about 1 dose of her Lantus per week.   Past Medical History  Diagnosis Date  . Diabetes mellitus without complication   . Hypertension   . High cholesterol   . Anxiety    Past Surgical History  Procedure Laterality Date  . Vaginal polyp removal     History reviewed. No pertinent family history. History  Substance Use Topics  . Smoking status: Former Games developer  . Smokeless tobacco: Not on file  . Alcohol Use: No   OB History   Grav Para Term Preterm Abortions TAB SAB Ect Mult Living                 Review of Systems All other systems negative except as documented in the HPI. All pertinent positives and negatives as reviewed in the HPI. Allergies  Penicillins  Home Medications   Current Outpatient Rx  Name  Route  Sig  Dispense  Refill  . ALPRAZolam (XANAX) 0.5 MG tablet   Oral   Take 0.5 mg by mouth every  8 (eight) hours as needed for anxiety.         Marland Kitchen amLODipine-benazepril (LOTREL) 10-40 MG per capsule   Oral   Take 1 capsule by mouth every morning.         . colchicine 0.6 MG tablet   Oral   Take 0.6 mg by mouth 2 (two) times daily.         Marland Kitchen glipiZIDE (GLUCOTROL) 10 MG tablet   Oral   Take 10 mg by mouth every morning.         . insulin glargine (LANTUS) 100 UNIT/ML injection   Subcutaneous   Inject 15-25 Units into the skin 2 (two) times daily. Inject 25 units in the morning and 15 units in the evening         . metoprolol (LOPRESSOR) 50 MG tablet   Oral   Take 50 mg by mouth 2 (two) times daily.         Marland Kitchen oxyCODONE-acetaminophen (PERCOCET/ROXICET) 5-325 MG per tablet   Oral   Take 1 tablet by mouth every 4 (four) hours as needed for pain.         . pantoprazole (PROTONIX) 40 MG tablet   Oral   Take 40 mg by mouth every morning.         . predniSONE (DELTASONE) 20 MG tablet   Oral   Take  2 tablets (40 mg total) by mouth daily. Starting 02/02/2013   30 tablet   0   . simvastatin (ZOCOR) 20 MG tablet   Oral   Take 20 mg by mouth every evening.         . triamterene-hydrochlorothiazide (MAXZIDE-25) 37.5-25 MG per tablet   Oral   Take 1 tablet by mouth every morning.          BP 146/69  Pulse 68  Temp(Src) 98.5 F (36.9 C) (Oral)  Resp 20  SpO2 98% Physical Exam  Nursing note and vitals reviewed. Constitutional: She is oriented to person, place, and time. She appears well-developed and well-nourished. No distress.  HENT:  Head: Normocephalic and atraumatic.  Cardiovascular: Normal rate, regular rhythm and normal heart sounds.  Exam reveals no gallop and no friction rub.   No murmur heard. Pulmonary/Chest: Effort normal and breath sounds normal. No respiratory distress. She has no wheezes. She has no rales.  Abdominal: Soft. Bowel sounds are normal. She exhibits no distension. There is no tenderness. There is no rebound and no guarding.   Neurological: She is alert and oriented to person, place, and time.  Skin: Skin is warm and dry.    ED Course  Procedures (including critical care time) Labs Review Labs Reviewed  GLUCOSE, CAPILLARY - Abnormal; Notable for the following:    Glucose-Capillary 364 (*)    All other components within normal limits  GLUCOSE, CAPILLARY - Abnormal; Notable for the following:    Glucose-Capillary 368 (*)    All other components within normal limits  CBC - Abnormal; Notable for the following:    WBC 12.2 (*)    All other components within normal limits  BASIC METABOLIC PANEL - Abnormal; Notable for the following:    Sodium 130 (*)    Potassium 5.5 (*)    Glucose, Bld 390 (*)    BUN 26 (*)    Creatinine, Ser 1.23 (*)    GFR calc non Af Amer 41 (*)    GFR calc Af Amer 47 (*)    All other components within normal limits  URINALYSIS, ROUTINE W REFLEX MICROSCOPIC - Abnormal; Notable for the following:    Glucose, UA 250 (*)    Leukocytes, UA SMALL (*)    All other components within normal limits  GLUCOSE, CAPILLARY - Abnormal; Notable for the following:    Glucose-Capillary 169 (*)    All other components within normal limits  POTASSIUM  URINE MICROSCOPIC-ADD ON   Imaging Review Dg Chest 2 View  05/04/2013   CLINICAL DATA:  Shortness of breath, diabetic  EXAM: CHEST  2 VIEW  COMPARISON:  March 15, 2009  FINDINGS: The heart size and vascular pattern are normal. Lungs are clear. There are no pleural effusions.  IMPRESSION: No active cardiopulmonary disease.   Electronically Signed   By: Esperanza Heir M.D.   On: 05/04/2013 19:59    EKG Interpretation    Date/Time:  Monday May 04 2013 18:06:15 EST Ventricular Rate:  68 PR Interval:  176 QRS Duration: 89 QT Interval:  392 QTC Calculation: 417 R Axis:   5 Text Interpretation:  Age not entered, assumed to be  79 years old for purpose of ECG interpretation Sinus rhythm No significant change was found Confirmed by [facility]  MD,  TREY ([id number]) on 05/04/2013 6:43:58 PM            Patient has no signs of infection on her testing.  The patient did not take  her insulin last night.  The patient's blood sugars come down well, and the patient is feeling better at this time.  Her vital signs are stable   Carlyle Dolly, PA-C 05/05/13 0143

## 2013-05-06 NOTE — ED Provider Notes (Signed)
Medical screening examination/treatment/procedure(s) were conducted as a shared visit with non-physician practitioner(s) and myself.  I personally evaluated the patient during the encounter.   Please see my separate note.     Candyce Churn, MD 05/06/13 1254

## 2013-06-02 ENCOUNTER — Emergency Department (HOSPITAL_COMMUNITY)
Admission: EM | Admit: 2013-06-02 | Discharge: 2013-06-02 | Disposition: A | Discharge status: Home / Self Care | Payer: PRIVATE HEALTH INSURANCE | Attending: Emergency Medicine | Admitting: Emergency Medicine

## 2013-06-02 ENCOUNTER — Emergency Department (HOSPITAL_COMMUNITY): Payer: PRIVATE HEALTH INSURANCE

## 2013-06-02 ENCOUNTER — Encounter (HOSPITAL_COMMUNITY): Payer: Self-pay | Admitting: Emergency Medicine

## 2013-06-02 DIAGNOSIS — Z87891 Personal history of nicotine dependence: Secondary | ICD-10-CM | POA: Insufficient documentation

## 2013-06-02 DIAGNOSIS — E7800 Pure hypercholesterolemia, unspecified: Secondary | ICD-10-CM | POA: Insufficient documentation

## 2013-06-02 DIAGNOSIS — R1033 Periumbilical pain: Secondary | ICD-10-CM | POA: Diagnosis present

## 2013-06-02 DIAGNOSIS — E119 Type 2 diabetes mellitus without complications: Secondary | ICD-10-CM | POA: Diagnosis not present

## 2013-06-02 DIAGNOSIS — Z880 Allergy status to penicillin: Secondary | ICD-10-CM | POA: Insufficient documentation

## 2013-06-02 DIAGNOSIS — F411 Generalized anxiety disorder: Secondary | ICD-10-CM | POA: Insufficient documentation

## 2013-06-02 DIAGNOSIS — Z794 Long term (current) use of insulin: Secondary | ICD-10-CM | POA: Insufficient documentation

## 2013-06-02 DIAGNOSIS — R197 Diarrhea, unspecified: Secondary | ICD-10-CM | POA: Diagnosis not present

## 2013-06-02 DIAGNOSIS — N390 Urinary tract infection, site not specified: Secondary | ICD-10-CM | POA: Diagnosis not present

## 2013-06-02 DIAGNOSIS — Z79899 Other long term (current) drug therapy: Secondary | ICD-10-CM | POA: Insufficient documentation

## 2013-06-02 DIAGNOSIS — R110 Nausea: Secondary | ICD-10-CM

## 2013-06-02 DIAGNOSIS — IMO0002 Reserved for concepts with insufficient information to code with codable children: Secondary | ICD-10-CM | POA: Insufficient documentation

## 2013-06-02 DIAGNOSIS — I10 Essential (primary) hypertension: Secondary | ICD-10-CM | POA: Insufficient documentation

## 2013-06-02 LAB — COMPREHENSIVE METABOLIC PANEL
Alkaline Phosphatase: 115 U/L (ref 39–117)
BUN: 16 mg/dL (ref 6–23)
CO2: 22 mEq/L (ref 19–32)
Chloride: 105 mEq/L (ref 96–112)
GFR calc Af Amer: 64 mL/min — ABNORMAL LOW (ref >90)
GFR calc non Af Amer: 55 mL/min — ABNORMAL LOW (ref >90)
Glucose, Bld: 147 mg/dL — ABNORMAL HIGH (ref 70–99)
Potassium: 3.8 mEq/L (ref 3.7–5.3)
Total Bilirubin: 0.3 mg/dL (ref 0.3–1.2)

## 2013-06-02 LAB — URINALYSIS, ROUTINE W REFLEX MICROSCOPIC
Bilirubin Urine: NEGATIVE
Glucose, UA: NEGATIVE mg/dL
Hgb urine dipstick: NEGATIVE
Ketones, ur: NEGATIVE mg/dL
Nitrite: NEGATIVE
Urobilinogen, UA: 0.2 mg/dL (ref 0.0–1.0)
pH: 5.5 (ref 5.0–8.0)

## 2013-06-02 LAB — CBC WITH DIFFERENTIAL/PLATELET
Hemoglobin: 12.4 g/dL (ref 12.0–15.0)
Lymphocytes Relative: 29 % (ref 12–46)
Lymphs Abs: 3 10*3/uL (ref 0.7–4.0)
Monocytes Relative: 5 % (ref 3–12)
Neutro Abs: 6.8 10*3/uL (ref 1.7–7.7)
Neutrophils Relative %: 66 % (ref 43–77)
RBC: 4.35 MIL/uL (ref 3.87–5.11)
WBC: 10.4 10*3/uL (ref 4.0–10.5)

## 2013-06-02 LAB — CG4 I-STAT (LACTIC ACID): Lactic Acid, Venous: 1.31 mmol/L (ref 0.5–2.2)

## 2013-06-02 LAB — URINE MICROSCOPIC-ADD ON

## 2013-06-02 LAB — LIPASE, BLOOD: Lipase: 23 U/L (ref 11–59)

## 2013-06-02 MED ORDER — ONDANSETRON HCL 4 MG PO TABS: 4.0000 mg | ORAL_TABLET | Freq: Four times a day (QID) | ORAL | Status: DC

## 2013-06-02 MED ORDER — IOHEXOL 300 MG/ML  SOLN
50.0000 mL | Freq: Once | INTRAMUSCULAR | Status: AC | PRN
  Administered 2013-06-02: 50 mL via ORAL

## 2013-06-02 MED ORDER — SULFAMETHOXAZOLE-TRIMETHOPRIM 800-160 MG PO TABS: 1.0000 | ORAL_TABLET | Freq: Two times a day (BID) | ORAL | Status: AC

## 2013-06-02 MED ORDER — IOHEXOL 300 MG/ML  SOLN
100.0000 mL | Freq: Once | INTRAMUSCULAR | Status: AC | PRN
  Administered 2013-06-02: 100 mL via INTRAVENOUS

## 2013-06-02 MED ORDER — SODIUM CHLORIDE 0.9 % IV BOLUS (SEPSIS)
1000.0000 mL | Freq: Once | INTRAVENOUS | Status: AC
  Administered 2013-06-02: 1000 mL via INTRAVENOUS

## 2013-06-02 MED ORDER — ONDANSETRON HCL 4 MG/2ML IJ SOLN
4.0000 mg | Freq: Once | INTRAMUSCULAR | Status: AC
  Administered 2013-06-02: 4 mg via INTRAVENOUS
  Filled 2013-06-02: qty 2

## 2013-06-02 NOTE — ED Notes (Signed)
Per EMS: Pt c/o epigastric pain, nausea & diarrhea x 2 days.  No vomiting.  Was around sick grandchildren.  Pt orthostatic (systolic 166 to 150 standing)

## 2013-06-02 NOTE — ED Provider Notes (Signed)
TIME SEEN: 9:32 AM  CHIEF COMPLAINT: Abdominal pain, nausea, diarrhea  HPI: Patient is a 79 year old female with a history of hypertension, hyperlipidemia, diabetes who presents emergency department with 2 days of nausea, multiple episodes of nonbloody diarrhea and periumbilical abdominal pain. She denies any vomiting. She states that her grandchildren have had cold-like symptoms but no one in the house has had GI symptoms. She denies any recent travel, hospitalization or antibiotic use. No fever or chills. No prior abdominal surgeries. No dysuria or hematuria, vaginal discharge or bleeding.  ROS: See HPI Constitutional: no fever  Eyes: no drainage  ENT: no runny nose   Cardiovascular:  no chest pain  Resp: no SOB  GI: no vomiting GU: no dysuria Integumentary: no rash  Allergy: no hives  Musculoskeletal: no leg swelling  Neurological: no slurred speech ROS otherwise negative  PAST MEDICAL HISTORY/PAST SURGICAL HISTORY:  Past Medical History  Diagnosis Date  . Diabetes mellitus without complication   . Hypertension   . High cholesterol   . Anxiety     MEDICATIONS:  Prior to Admission medications   Medication Sig Start Date End Date Taking? Authorizing Provider  ALPRAZolam Prudy Feeler) 0.5 MG tablet Take 0.5 mg by mouth every 8 (eight) hours as needed for anxiety.    Historical Provider, MD  amLODipine-benazepril (LOTREL) 10-40 MG per capsule Take 1 capsule by mouth every morning.    Historical Provider, MD  colchicine 0.6 MG tablet Take 0.6 mg by mouth 2 (two) times daily.    Historical Provider, MD  glipiZIDE (GLUCOTROL) 10 MG tablet Take 10 mg by mouth every morning.    Historical Provider, MD  insulin glargine (LANTUS) 100 UNIT/ML injection Inject 15-25 Units into the skin 2 (two) times daily. Inject 25 units in the morning and 15 units in the evening    Historical Provider, MD  metoprolol (LOPRESSOR) 50 MG tablet Take 50 mg by mouth 2 (two) times daily.    Historical Provider, MD   oxyCODONE-acetaminophen (PERCOCET/ROXICET) 5-325 MG per tablet Take 1 tablet by mouth every 4 (four) hours as needed for pain.    Historical Provider, MD  pantoprazole (PROTONIX) 40 MG tablet Take 40 mg by mouth every morning.    Historical Provider, MD  predniSONE (DELTASONE) 20 MG tablet Take 2 tablets (40 mg total) by mouth daily. Starting 02/02/2013 02/01/13   Renne Crigler, PA-C  simvastatin (ZOCOR) 20 MG tablet Take 20 mg by mouth every evening.    Historical Provider, MD  triamterene-hydrochlorothiazide (MAXZIDE-25) 37.5-25 MG per tablet Take 1 tablet by mouth every morning.    Historical Provider, MD    ALLERGIES:  Allergies  Allergen Reactions  . Penicillins Anaphylaxis, Hives and Rash    SOCIAL HISTORY:  History  Substance Use Topics  . Smoking status: Former Games developer  . Smokeless tobacco: Not on file  . Alcohol Use: No    FAMILY HISTORY: No family history on file.  EXAM: BP 163/60  Pulse 62  Temp(Src) 98.6 F (37 C) (Oral)  Resp 18  SpO2 100% CONSTITUTIONAL: Alert and oriented and responds appropriately to questions. Well-appearing; well-nourished, no apparent distress HEAD: Normocephalic EYES: Conjunctivae clear, PERRL ENT: normal nose; no rhinorrhea; moist mucous membranes; pharynx without lesions noted NECK: Supple, no meningismus, no LAD  CARD: RRR; S1 and S2 appreciated; no murmurs, no clicks, no rubs, no gallops RESP: Normal chest excursion without splinting or tachypnea; breath sounds clear and equal bilaterally; no wheezes, no rhonchi, no rales,  ABD/GI: Normal bowel sounds; non-distended;  soft, tender to palpation diffusely around the umbilicus with voluntary guarding, no rebound BACK:  The back appears normal and is non-tender to palpation, there is no CVA tenderness EXT: Normal ROM in all joints; non-tender to palpation; no edema; normal capillary refill; no cyanosis    SKIN: Normal color for age and race; warm NEURO: Moves all extremities  equally PSYCH: The patient's mood and manner are appropriate. Grooming and personal hygiene are appropriate.  MEDICAL DECISION MAKING: Patient here with nausea, diarrhea and abdominal pain. She is tender to palpation periumbilically. Will obtain labs, urine, CT scan. We'll give IV fluids and antiemetics.  ED PROGRESS: Patient's labs are reassuring. She does have large leukocytes in her urine but no other sign of infection. CT scan pending.   Patient has urinary tract infection. CT scan is unremarkable. Patient has been able to tolerate by mouth. No further diarrhea in the emergency department. She feels much better and is ready for discharge home. Given return precautions, PCP followup and antibiotics for her UTI.  Layla Maw Dashanti Burr, DO 06/02/13 [phone]

## 2013-06-10 ENCOUNTER — Emergency Department (HOSPITAL_COMMUNITY): Payer: PRIVATE HEALTH INSURANCE

## 2013-06-10 ENCOUNTER — Encounter (HOSPITAL_COMMUNITY): Payer: Self-pay | Admitting: Emergency Medicine

## 2013-06-10 ENCOUNTER — Emergency Department (HOSPITAL_COMMUNITY)
Admission: EM | Admit: 2013-06-10 | Discharge: 2013-06-10 | Disposition: A | Discharge status: Home / Self Care | Payer: PRIVATE HEALTH INSURANCE | Attending: Emergency Medicine | Admitting: Emergency Medicine

## 2013-06-10 DIAGNOSIS — E119 Type 2 diabetes mellitus without complications: Secondary | ICD-10-CM | POA: Insufficient documentation

## 2013-06-10 DIAGNOSIS — R197 Diarrhea, unspecified: Secondary | ICD-10-CM | POA: Insufficient documentation

## 2013-06-10 DIAGNOSIS — Z794 Long term (current) use of insulin: Secondary | ICD-10-CM | POA: Insufficient documentation

## 2013-06-10 DIAGNOSIS — R300 Dysuria: Secondary | ICD-10-CM | POA: Insufficient documentation

## 2013-06-10 DIAGNOSIS — I10 Essential (primary) hypertension: Secondary | ICD-10-CM | POA: Insufficient documentation

## 2013-06-10 DIAGNOSIS — R110 Nausea: Secondary | ICD-10-CM | POA: Insufficient documentation

## 2013-06-10 DIAGNOSIS — R350 Frequency of micturition: Secondary | ICD-10-CM | POA: Insufficient documentation

## 2013-06-10 DIAGNOSIS — Z880 Allergy status to penicillin: Secondary | ICD-10-CM | POA: Insufficient documentation

## 2013-06-10 DIAGNOSIS — E7800 Pure hypercholesterolemia, unspecified: Secondary | ICD-10-CM | POA: Insufficient documentation

## 2013-06-10 DIAGNOSIS — D649 Anemia, unspecified: Secondary | ICD-10-CM | POA: Insufficient documentation

## 2013-06-10 DIAGNOSIS — R739 Hyperglycemia, unspecified: Secondary | ICD-10-CM

## 2013-06-10 DIAGNOSIS — Z87440 Personal history of urinary (tract) infections: Secondary | ICD-10-CM | POA: Insufficient documentation

## 2013-06-10 DIAGNOSIS — R109 Unspecified abdominal pain: Secondary | ICD-10-CM

## 2013-06-10 DIAGNOSIS — Z87891 Personal history of nicotine dependence: Secondary | ICD-10-CM | POA: Insufficient documentation

## 2013-06-10 DIAGNOSIS — R1011 Right upper quadrant pain: Secondary | ICD-10-CM | POA: Insufficient documentation

## 2013-06-10 DIAGNOSIS — F411 Generalized anxiety disorder: Secondary | ICD-10-CM | POA: Insufficient documentation

## 2013-06-10 DIAGNOSIS — Z79899 Other long term (current) drug therapy: Secondary | ICD-10-CM | POA: Insufficient documentation

## 2013-06-10 DIAGNOSIS — R7989 Other specified abnormal findings of blood chemistry: Secondary | ICD-10-CM | POA: Insufficient documentation

## 2013-06-10 DIAGNOSIS — R195 Other fecal abnormalities: Secondary | ICD-10-CM

## 2013-06-10 LAB — CBC WITH DIFFERENTIAL/PLATELET
BASOS ABS: 0 10*3/uL (ref 0.0–0.1)
BASOS PCT: 0 % (ref 0–1)
Eosinophils Absolute: 0.2 10*3/uL (ref 0.0–0.7)
Eosinophils Relative: 3 % (ref 0–5)
HCT: 35.1 % — ABNORMAL LOW (ref 36.0–46.0)
HEMOGLOBIN: 11.4 g/dL — AB (ref 12.0–15.0)
Lymphocytes Relative: 37 % (ref 12–46)
Lymphs Abs: 2.7 10*3/uL (ref 0.7–4.0)
MCH: 28.9 pg (ref 26.0–34.0)
MCHC: 32.5 g/dL (ref 30.0–36.0)
MCV: 89.1 fL (ref 78.0–100.0)
MONOS PCT: 8 % (ref 3–12)
Monocytes Absolute: 0.6 10*3/uL (ref 0.1–1.0)
NEUTROS ABS: 3.7 10*3/uL (ref 1.7–7.7)
NEUTROS PCT: 51 % (ref 43–77)
Platelets: 185 10*3/uL (ref 150–400)
RBC: 3.94 MIL/uL (ref 3.87–5.11)
RDW: 15.3 % (ref 11.5–15.5)
WBC: 7.2 10*3/uL (ref 4.0–10.5)

## 2013-06-10 LAB — URINALYSIS, ROUTINE W REFLEX MICROSCOPIC
Bilirubin Urine: NEGATIVE
Glucose, UA: 250 mg/dL — AB
Ketones, ur: NEGATIVE mg/dL
Leukocytes, UA: NEGATIVE
Nitrite: NEGATIVE
PROTEIN: NEGATIVE mg/dL
Specific Gravity, Urine: 1.013 (ref 1.005–1.030)
UROBILINOGEN UA: 0.2 mg/dL (ref 0.0–1.0)
pH: 6 (ref 5.0–8.0)

## 2013-06-10 LAB — COMPREHENSIVE METABOLIC PANEL
ALBUMIN: 3 g/dL — AB (ref 3.5–5.2)
ALT: 25 U/L (ref 0–35)
AST: 30 U/L (ref 0–37)
Alkaline Phosphatase: 97 U/L (ref 39–117)
BUN: 21 mg/dL (ref 6–23)
CALCIUM: 9 mg/dL (ref 8.4–10.5)
CO2: 25 mEq/L (ref 19–32)
Chloride: 99 mEq/L (ref 96–112)
Creatinine, Ser: 1.68 mg/dL — ABNORMAL HIGH (ref 0.50–1.10)
GFR calc Af Amer: 32 mL/min — ABNORMAL LOW (ref >90)
GFR calc non Af Amer: 28 mL/min — ABNORMAL LOW (ref >90)
Glucose, Bld: 282 mg/dL — ABNORMAL HIGH (ref 70–99)
Potassium: 4.6 mEq/L (ref 3.7–5.3)
Sodium: 133 mEq/L — ABNORMAL LOW (ref 137–147)
TOTAL PROTEIN: 6.2 g/dL (ref 6.0–8.3)
Total Bilirubin: 0.2 mg/dL — ABNORMAL LOW (ref 0.3–1.2)

## 2013-06-10 LAB — GLUCOSE, CAPILLARY: Glucose-Capillary: 256 mg/dL — ABNORMAL HIGH (ref 70–99)

## 2013-06-10 LAB — LIPASE, BLOOD: Lipase: 19 U/L (ref 11–59)

## 2013-06-10 LAB — TROPONIN I: Troponin I: <0.3 ng/mL (ref <0.30)

## 2013-06-10 LAB — URINE MICROSCOPIC-ADD ON

## 2013-06-10 MED ORDER — LACTINEX PO CHEW: 1.0000 | CHEWABLE_TABLET | Freq: Three times a day (TID) | ORAL | Status: DC

## 2013-06-10 MED ORDER — MORPHINE SULFATE 4 MG/ML IJ SOLN
4.0000 mg | Freq: Once | INTRAMUSCULAR | Status: AC
  Administered 2013-06-10: 4 mg via INTRAVENOUS
  Filled 2013-06-10: qty 1

## 2013-06-10 MED ORDER — ONDANSETRON HCL 4 MG PO TABS: 4.0000 mg | ORAL_TABLET | Freq: Three times a day (TID) | ORAL | Status: DC | PRN

## 2013-06-10 MED ORDER — ONDANSETRON HCL 4 MG/2ML IJ SOLN
4.0000 mg | Freq: Once | INTRAMUSCULAR | Status: AC
  Administered 2013-06-10: 4 mg via INTRAVENOUS
  Filled 2013-06-10: qty 2

## 2013-06-10 NOTE — ED Notes (Signed)
To ED from home via [facility] for abd pain X2w, no vomiting, treated for UTI with no improvement, reports urinary frequency but denies dysuria, denies CP/SOB or other complaints, ambulatory and in NAD, CBG 268 per EMS, pt took 25 units Lantus pta,

## 2013-06-10 NOTE — ED Provider Notes (Signed)
CSN: [id number]     Arrival date & time 06/10/13  1502 History   First MD Initiated Contact with Patient 06/10/13 1510     Chief Complaint  Patient presents with  . Abdominal Pain   (Consider location/radiation/quality/duration/timing/severity/associated sxs/prior Treatment) The history is provided by the patient.   This is a 80 year old female with past medical history of diabetes, hypertension, high cholesterol, anxiety presents the emergency department chief complaint of abdominal discomfort.  The patient and her caregivers give the history.   Patient states that she was seen here on 06/02/2013 and treated for urinary tract infection.  Patient was started on an antibiotic.  She states that she has had abdominal discomfort since beginning the antibiotic.  Patient complains of nausea, epigastric abdominal pain, and loose stools.  Patient states that nothing makes the symptoms worse or better.  She denies any alcohol abuse, NSAID use.  No previous history of abdominal surgeries.  Patient also complains of continued urinary symptoms including dysuria, frequency.  She denies any flank pain, hematuria, suprapubic pain. Patient states that she has been out of her pain medication and her anxiety medication for the past month.  She recently had her pain medications filled by her primary care physician on 06/08/2013.  Patient thinks that some of her symptoms are due to the fact that she was out of her pain medication and her anxiety medication. Review of patient's chart shows no urine culture performed.  CT scan on 06/02/2013 showed no acute abnormality. Denies fevers, chills, myalgias, arthralgias. Denies DOE, SOB, chest tightness or pressure, radiation to left arm, jaw or back, or diaphoresis. Denies dysuria, flank pain, suprapubic pain, frequency, urgency, or hematuria. Denies headaches, light headedness, weakness, visual disturbances. Denies vomiting, melena, hematochezia, constipation.    Past Medical  History  Diagnosis Date  . Diabetes mellitus without complication   . Hypertension   . High cholesterol   . Anxiety    Past Surgical History  Procedure Laterality Date  . Vaginal polyp removal     No family history on file. History  Substance Use Topics  . Smoking status: Former Research scientist (life sciences)  . Smokeless tobacco: Not on file  . Alcohol Use: No   OB History   Grav Para Term Preterm Abortions TAB SAB Ect Mult Living                 Review of Systems Ten systems reviewed and are negative for acute change, except as noted in the HPI.   Allergies  Penicillins  Home Medications   Current Outpatient Rx  Name  Route  Sig  Dispense  Refill  . insulin glargine (LANTUS) 100 UNIT/ML injection   Subcutaneous   Inject 15-25 Units into the skin 2 (two) times daily. Inject 25 units in the morning and 15 units in the evening         . ALPRAZolam (XANAX) 0.5 MG tablet   Oral   Take 0.5 mg by mouth every 8 (eight) hours as needed for anxiety.         Marland Kitchen amLODipine-benazepril (LOTREL) 10-40 MG per capsule   Oral   Take 1 capsule by mouth every morning.         . busPIRone (BUSPAR) 7.5 MG tablet   Oral   Take 7.5 mg by mouth 2 (two) times daily.         . colchicine 0.6 MG tablet   Oral   Take 0.6 mg by mouth 2 (two) times daily.         Marland Kitchen  glipiZIDE (GLUCOTROL) 10 MG tablet   Oral   Take 10 mg by mouth every morning.         . metoprolol (LOPRESSOR) 50 MG tablet   Oral   Take 50 mg by mouth 2 (two) times daily.         . ondansetron (ZOFRAN) 4 MG tablet   Oral   Take 1 tablet (4 mg total) by mouth every 6 (six) hours.   12 tablet   0   . oxyCODONE-acetaminophen (PERCOCET/ROXICET) 5-325 MG per tablet   Oral   Take 1 tablet by mouth every 4 (four) hours as needed for pain.         . pantoprazole (PROTONIX) 40 MG tablet   Oral   Take 40 mg by mouth every morning.         . simvastatin (ZOCOR) 20 MG tablet   Oral   Take 20 mg by mouth every evening.          . sitaGLIPtin-metformin (JANUMET) 50-500 MG per tablet   Oral   Take 1 tablet by mouth daily.         Marland Kitchen triamterene-hydrochlorothiazide (MAXZIDE-25) 37.5-25 MG per tablet   Oral   Take 1 tablet by mouth every morning.          BP 191/68  Pulse 66  Temp(Src) 98.5 F (36.9 C) (Oral)  Resp 16  Ht 5\' 2"  (1.575 m)  Wt 180 lb (81.647 kg)  BMI 32.91 kg/m2  SpO2 97% Physical Exam Physical Exam  Nursing note and vitals reviewed. Constitutional: She is oriented to person, place, and time. She appears well-developed and well-nourished. No distress.  HENT:  Head: Normocephalic and atraumatic.  Eyes: Conjunctivae normal and EOM are normal. Pupils are equal, round, and reactive to light. No scleral icterus.  Neck: Normal range of motion.  Cardiovascular: Normal rate, regular rhythm and normal heart sounds.  Exam reveals no gallop and no friction rub.   No murmur heard. Pulmonary/Chest: Effort normal and breath sounds normal. No respiratory distress.  Abdominal: Soft. Bowel sounds are normal. She exhibits no distension and no mass. There is no tenderness. There is no guarding.  Neurological: She is alert and oriented to person, place, and time.  Skin: Skin is warm and dry. She is not diaphoretic.    ED Course  Procedures (including critical care time) Labs Review Labs Reviewed  CBC WITH DIFFERENTIAL  LIPASE, BLOOD  URINALYSIS, ROUTINE W REFLEX MICROSCOPIC  COMPREHENSIVE METABOLIC PANEL  TROPONIN I  URINALYSIS, ROUTINE W REFLEX MICROSCOPIC   Imaging Review No results found.  EKG Interpretation   None       Date: 06/10/2013  Rate: 63  Rhythm: normal sinus rhythm  QRS Axis: normal  Intervals: normal  ST/T Wave abnormalities: normal  Conduction Disutrbances:none  Narrative Interpretation:   Old EKG Reviewed: unchanged    MDM   1. Abdominal pain   2. Hyperglycemia   3. Nausea   4. Loose stools   5. Elevated serum creatinine   6. Hypertension    3:39  PM BP 191/68  Pulse 66  Temp(Src) 98.5 F (36.9 C) (Oral)  Resp 16  Ht 5\' 2"  (1.575 m)  Wt 180 lb (81.647 kg)  BMI 32.91 kg/m2  SpO2 97% Patient appears well. Patient has benign abdominal exam, no tenderness. Patient seen in shared visit with Dr. Jeraldine Loots.   3:51 PM Patient complained to Nurse Myrtha Mantis of pain. Repeat exam shows RUQ abdominal pain that is  non radiating.  Patient states "I didn't tell you it hurt because I thought you needed to press on my belly." Patient will be given Morphine/ Zofran. I have ordered RUQ [place],.  CBG 256, normal EKG, unchanged form previous.  I personally reviewed and interpreted EKGs.    7:28 PM Patient labs show mild drop in hgb, mild anemia. Hyperglycemia, fatty liver, patient's creatinine elevated,  Which it has been preciously. I believe second to volume contraction as her sugars are high. I will have her drink fluids at home and follow up with PCP.  Patient is also hypertensive. No headache,visual disturbance, ul weakness, difficulty with speech or swallowing   Margarita Mail, PA-C 06/10/13 2236

## 2013-06-10 NOTE — Discharge Instructions (Signed)
Abdominal (belly) pain can be caused by many things. Your caregiver performed an examination and possibly ordered blood/urine tests and imaging (CT scan, x-rays, ultrasound). Many cases can be observed and treated at home after initial evaluation in the emergency department. Even though you are being discharged home, abdominal pain can be unpredictable. Therefore, you need a repeated exam if your pain does not resolve, returns, or worsens. Most patients with abdominal pain don't have to be admitted to the hospital or have surgery, but serious problems like appendicitis and gallbladder attacks can start out as nonspecific pain. Many abdominal conditions cannot be diagnosed in one visit, so follow-up evaluations are very important. SEEK IMMEDIATE MEDICAL ATTENTION IF: The pain does not go away or becomes severe.  A temperature above 101 develops.  Repeated vomiting occurs (multiple episodes).  The pain becomes localized to portions of the abdomen. The right side could possibly be appendicitis. In an adult, the left lower portion of the abdomen could be colitis or diverticulitis.  Blood is being passed in stools or vomit (bright red or black tarry stools).  Return also if you develop chest pain, difficulty breathing, dizziness or fainting, or become confused, poorly responsive, or inconsolable (young children).    Abdominal Pain Abdominal pain can be caused by many things. Your caregiver decides the seriousness of your pain by an examination and possibly blood tests and X-rays. Many cases can be observed and treated at home. Most abdominal pain is not caused by a disease and will probably improve without treatment. However, in many cases, more time must pass before a clear cause of the pain can be found. Before that point, it may not be known if you need more testing, or if hospitalization or surgery is needed. HOME CARE INSTRUCTIONS   Do not take laxatives unless directed by your caregiver.  Take pain  medicine only as directed by your caregiver.  Only take over-the-counter or prescription medicines for pain, discomfort, or fever as directed by your caregiver.  Try a clear liquid diet (broth, tea, or water) for as long as directed by your caregiver. Slowly move to a bland diet as tolerated. SEEK IMMEDIATE MEDICAL CARE IF:   The pain does not go away.  You have a fever.  You keep throwing up (vomiting).  The pain is felt only in portions of the abdomen. Pain in the right side could possibly be appendicitis. In an adult, pain in the left lower portion of the abdomen could be colitis or diverticulitis.  You pass bloody or black tarry stools. MAKE SURE YOU:   Understand these instructions.  Will watch your condition.  Will get help right away if you are not doing well or get worse. Document Released: 02/28/2005 Document Revised: 08/13/2011 Document Reviewed: 01/07/2008 [facility] Patient Information 2014 [place].

## 2013-06-10 NOTE — ED Notes (Signed)
Pt and family state she normally runs a high blood pressure-states she has not taken her night time blood pressure meds/Primary RN, Suezanne Jacquet, made aware

## 2013-06-10 NOTE — Progress Notes (Signed)
   CARE MANAGEMENT ED NOTE 06/10/2013  Patient:  Rhonda Conrad, Rhonda Conrad   Account Number:  [id number]  Date Initiated:  06/10/2013  Documentation initiated by:  Livia Snellen  Subjective/Objective Assessment:   Patient presents to ED with abdominla pain for two weeks, no vomiting.  Treated for UTI without improvement.     Subjective/Objective Assessment Detail:   Patient awake, alert, afebrile.  PMHX of DM, HTN, high cholesterol.     Action/Plan:   Patient given IV morphine and zofran in ED.   Action/Plan Detail:   Anticipated DC Date:       Status Recommendation to Physician:   Result of Recommendation:    Other ED Services  Consult Working [place]  Other    Choice offered to / List presented to:            Status of service:  Completed, signed off  ED Comments:   ED Comments Detail:  EDCM spoke to patient  and her grand daughter Rhonda Conrad at bedside.  Patient lives at home with her grand daughter. Patient has a cane at home.  Patient reports she is able to perform her ADL's without difficulty.  Patient has hired a woman to come and clean the house for her.  Patient does not have home health services currently.  Patient confirmed NP Rhonda Conrad is her pcp.  EDCM provided patient with a list of home health agencies in [place].  Explained with home health she may recieve a visiting RN, PT, OT, aide and social worker if needed.  List given to grand daughter. Patient and family thankful for resources.

## 2013-06-10 NOTE — ED Provider Notes (Signed)
  This was a shared visit with a mid-level provided (NP or PA).  Throughout the patient's course I was available for consultation/collaboration.  I saw the ECG (if appropriate), relevant labs and studies - I agree with the interpretation.  On my exam the patient was in no distress.  She appears calm.  Her evaluation here was largely reassuring.      Carmin Muskrat, MD 06/10/13 2320

## 2013-06-13 ENCOUNTER — Emergency Department (HOSPITAL_COMMUNITY): Payer: PRIVATE HEALTH INSURANCE

## 2013-06-13 ENCOUNTER — Observation Stay (HOSPITAL_COMMUNITY)
Admission: EM | Admit: 2013-06-13 | Discharge: 2013-06-15 | Disposition: A | Discharge status: Home / Self Care | Payer: PRIVATE HEALTH INSURANCE | Attending: Internal Medicine | Admitting: Internal Medicine

## 2013-06-13 ENCOUNTER — Encounter (HOSPITAL_COMMUNITY): Payer: Self-pay | Admitting: Emergency Medicine

## 2013-06-13 DIAGNOSIS — Z794 Long term (current) use of insulin: Secondary | ICD-10-CM | POA: Insufficient documentation

## 2013-06-13 DIAGNOSIS — E7800 Pure hypercholesterolemia, unspecified: Secondary | ICD-10-CM | POA: Insufficient documentation

## 2013-06-13 DIAGNOSIS — R110 Nausea: Secondary | ICD-10-CM | POA: Insufficient documentation

## 2013-06-13 DIAGNOSIS — R079 Chest pain, unspecified: Secondary | ICD-10-CM

## 2013-06-13 DIAGNOSIS — E119 Type 2 diabetes mellitus without complications: Secondary | ICD-10-CM | POA: Insufficient documentation

## 2013-06-13 DIAGNOSIS — Z79899 Other long term (current) drug therapy: Secondary | ICD-10-CM | POA: Insufficient documentation

## 2013-06-13 DIAGNOSIS — R0789 Other chest pain: Principal | ICD-10-CM | POA: Insufficient documentation

## 2013-06-13 DIAGNOSIS — I10 Essential (primary) hypertension: Secondary | ICD-10-CM | POA: Insufficient documentation

## 2013-06-13 DIAGNOSIS — Z87891 Personal history of nicotine dependence: Secondary | ICD-10-CM | POA: Insufficient documentation

## 2013-06-13 DIAGNOSIS — E785 Hyperlipidemia, unspecified: Secondary | ICD-10-CM | POA: Diagnosis present

## 2013-06-13 DIAGNOSIS — F411 Generalized anxiety disorder: Secondary | ICD-10-CM | POA: Insufficient documentation

## 2013-06-13 DIAGNOSIS — Z880 Allergy status to penicillin: Secondary | ICD-10-CM | POA: Insufficient documentation

## 2013-06-13 LAB — COMPREHENSIVE METABOLIC PANEL
ALBUMIN: 3.1 g/dL — AB (ref 3.5–5.2)
ALT: 35 U/L (ref 0–35)
AST: 38 U/L — AB (ref 0–37)
Alkaline Phosphatase: 80 U/L (ref 39–117)
BUN: 15 mg/dL (ref 6–23)
CHLORIDE: 101 meq/L (ref 96–112)
CO2: 24 mEq/L (ref 19–32)
Calcium: 8.8 mg/dL (ref 8.4–10.5)
Creatinine, Ser: 0.98 mg/dL (ref 0.50–1.10)
GFR calc Af Amer: 62 mL/min — ABNORMAL LOW (ref >90)
GFR calc non Af Amer: 53 mL/min — ABNORMAL LOW (ref >90)
Glucose, Bld: 94 mg/dL (ref 70–99)
Potassium: 4.3 mEq/L (ref 3.7–5.3)
Sodium: 138 mEq/L (ref 137–147)
Total Bilirubin: 0.3 mg/dL (ref 0.3–1.2)
Total Protein: 6.6 g/dL (ref 6.0–8.3)

## 2013-06-13 LAB — CBC
HCT: 37 % (ref 36.0–46.0)
HCT: 36.3 % (ref 36.0–46.0)
Hemoglobin: 12.1 g/dL (ref 12.0–15.0)
Hemoglobin: 11.8 g/dL — ABNORMAL LOW (ref 12.0–15.0)
MCH: 28.8 pg (ref 26.0–34.0)
MCH: 28.8 pg (ref 26.0–34.0)
MCHC: 32.7 g/dL (ref 30.0–36.0)
MCHC: 32.5 g/dL (ref 30.0–36.0)
MCV: 88.1 fL (ref 78.0–100.0)
MCV: 88.5 fL (ref 78.0–100.0)
PLATELETS: 215 10*3/uL (ref 150–400)
PLATELETS: 212 10*3/uL (ref 150–400)
RBC: 4.2 MIL/uL (ref 3.87–5.11)
RBC: 4.1 MIL/uL (ref 3.87–5.11)
RDW: 14.9 % (ref 11.5–15.5)
RDW: 14.9 % (ref 11.5–15.5)
WBC: 9.1 10*3/uL (ref 4.0–10.5)
WBC: 8.7 10*3/uL (ref 4.0–10.5)

## 2013-06-13 LAB — CREATININE, SERUM
CREATININE: 0.98 mg/dL (ref 0.50–1.10)
GFR calc Af Amer: 62 mL/min — ABNORMAL LOW (ref >90)
GFR calc non Af Amer: 53 mL/min — ABNORMAL LOW (ref >90)

## 2013-06-13 LAB — TROPONIN I

## 2013-06-13 LAB — GLUCOSE, CAPILLARY: Glucose-Capillary: 91 mg/dL (ref 70–99)

## 2013-06-13 LAB — POCT I-STAT TROPONIN I: TROPONIN I, POC: 0 ng/mL (ref 0.00–0.08)

## 2013-06-13 LAB — LIPASE, BLOOD: Lipase: 15 U/L (ref 11–59)

## 2013-06-13 LAB — CK TOTAL AND CKMB (NOT AT ARMC)
CK, MB: 1.1 ng/mL (ref 0.3–4.0)
Relative Index: INVALID (ref 0.0–2.5)
Total CK: 34 U/L (ref 7–177)

## 2013-06-13 MED ORDER — SODIUM CHLORIDE 0.9 % IV SOLN: INTRAVENOUS | Status: DC

## 2013-06-13 MED ORDER — ASPIRIN EC 81 MG PO TBEC
81.0000 mg | DELAYED_RELEASE_TABLET | Freq: Every day | ORAL | Status: DC
  Administered 2013-06-14: 81 mg via ORAL
  Filled 2013-06-13 (×3): qty 1

## 2013-06-13 MED ORDER — INSULIN ASPART 100 UNIT/ML ~~LOC~~ SOLN: 0.0000 [IU] | SUBCUTANEOUS | Status: DC

## 2013-06-13 MED ORDER — OXYCODONE-ACETAMINOPHEN 5-325 MG PO TABS
1.0000 | ORAL_TABLET | ORAL | Status: DC | PRN
  Administered 2013-06-13 – 2013-06-14 (×2): 1 via ORAL
  Filled 2013-06-13 (×2): qty 1

## 2013-06-13 MED ORDER — INSULIN GLARGINE 100 UNIT/ML ~~LOC~~ SOLN
15.0000 [IU] | Freq: Every day | SUBCUTANEOUS | Status: DC
  Filled 2013-06-13 (×2): qty 0.15

## 2013-06-13 MED ORDER — TRIAMTERENE-HCTZ 37.5-25 MG PO TABS
1.0000 | ORAL_TABLET | Freq: Every morning | ORAL | Status: DC
  Administered 2013-06-14: 1 via ORAL
  Filled 2013-06-13: qty 1

## 2013-06-13 MED ORDER — HYDRALAZINE HCL 20 MG/ML IJ SOLN: 10.0000 mg | Freq: Four times a day (QID) | INTRAMUSCULAR | Status: DC | PRN

## 2013-06-13 MED ORDER — NITROGLYCERIN 0.4 MG SL SUBL
0.4000 mg | SUBLINGUAL_TABLET | SUBLINGUAL | Status: DC | PRN
  Administered 2013-06-14 (×3): 0.4 mg via SUBLINGUAL

## 2013-06-13 MED ORDER — METOPROLOL TARTRATE 50 MG PO TABS
50.0000 mg | ORAL_TABLET | Freq: Two times a day (BID) | ORAL | Status: DC
  Administered 2013-06-14 – 2013-06-15 (×3): 50 mg via ORAL
  Filled 2013-06-13 (×5): qty 1

## 2013-06-13 MED ORDER — ACETAMINOPHEN 325 MG PO TABS
650.0000 mg | ORAL_TABLET | Freq: Once | ORAL | Status: AC
  Administered 2013-06-13: 650 mg via ORAL
  Filled 2013-06-13: qty 2

## 2013-06-13 MED ORDER — SIMVASTATIN 20 MG PO TABS
20.0000 mg | ORAL_TABLET | Freq: Every evening | ORAL | Status: DC
  Administered 2013-06-13 – 2013-06-14 (×2): 20 mg via ORAL
  Filled 2013-06-13 (×3): qty 1

## 2013-06-13 MED ORDER — BUSPIRONE HCL 15 MG PO TABS
7.5000 mg | ORAL_TABLET | Freq: Two times a day (BID) | ORAL | Status: DC
  Administered 2013-06-14 – 2013-06-15 (×3): 7.5 mg via ORAL
  Filled 2013-06-13 (×6): qty 1

## 2013-06-13 MED ORDER — COLCHICINE 0.6 MG PO TABS
0.6000 mg | ORAL_TABLET | Freq: Two times a day (BID) | ORAL | Status: DC
  Administered 2013-06-14 – 2013-06-15 (×3): 0.6 mg via ORAL
  Filled 2013-06-13 (×5): qty 1

## 2013-06-13 MED ORDER — LACTINEX PO CHEW
1.0000 | CHEWABLE_TABLET | Freq: Three times a day (TID) | ORAL | Status: DC
Start: 2013-06-14 — End: 2013-06-15
  Administered 2013-06-14 (×3): 1 via ORAL
  Filled 2013-06-13 (×8): qty 1

## 2013-06-13 MED ORDER — MORPHINE SULFATE 2 MG/ML IJ SOLN
1.0000 mg | INTRAMUSCULAR | Status: DC | PRN
  Filled 2013-06-13: qty 1

## 2013-06-13 MED ORDER — ONDANSETRON HCL 4 MG PO TABS: 4.0000 mg | ORAL_TABLET | Freq: Three times a day (TID) | ORAL | Status: DC | PRN

## 2013-06-13 MED ORDER — INSULIN ASPART 100 UNIT/ML ~~LOC~~ SOLN: 0.0000 [IU] | Freq: Three times a day (TID) | SUBCUTANEOUS | Status: DC

## 2013-06-13 MED ORDER — HEPARIN SODIUM (PORCINE) 5000 UNIT/ML IJ SOLN
5000.0000 [IU] | Freq: Three times a day (TID) | INTRAMUSCULAR | Status: DC
  Administered 2013-06-13 – 2013-06-15 (×5): 5000 [IU] via SUBCUTANEOUS
  Filled 2013-06-13 (×8): qty 1

## 2013-06-13 NOTE — ED Notes (Signed)
Pt currently denying chest pain after receiving 1 nitro with EMS.

## 2013-06-13 NOTE — H&P (Addendum)
[facility]                                                                                    Patient Demographics  Rhonda Conrad, is a 80 y.o. female  MRN: [id number]   DOB - 1931-08-01  Admit Date - 06/13/2013  Outpatient Primary MD for the patient is [name], FNP   With History of -  Past Medical History  Diagnosis Date  . Diabetes mellitus without complication   . Hypertension   . High cholesterol   . Anxiety       Past Surgical History  Procedure Laterality Date  . Vaginal polyp removal      in for   Chief Complaint  Patient presents with  . Chest Pain     HPI  Rhonda Conrad  is a 80 y.o. female, with no known history of coronary artery disease, presents with complaints of chest pain, She describes it as midsternal sharp quality radiating to the upper shoulder, it started at rest, no previous history of chest pain, resolved when she received sublingual nitroglycerin by EMS, as well he received a 324 mg of aspirin in the ambulance, currently denies any chest pain, patient EKG did not show any acute changes, troponin is negative, she is known to have history of diabetes mellitus, hyperlipidemia, and hypertension, given that hospitalist requested to admit the patient for further evaluation of her chest pain.    Review of Systems    In addition to the HPI above,  No Fever-chills, No Headache, No changes with Vision or hearing, No problems swallowing food or Liquids, complaining Chest pain, but no Cough or Shortness of Breath, No Abdominal pain, No Nausea or Vommitting, Bowel movements are regular, No Blood in stool or Urine, No dysuria, No new skin rashes or bruises, No new joints pains-aches,  No new weakness, tingling, numbness in any extremity, No recent weight gain or loss, No polyuria, polydypsia or polyphagia, No significant Mental Stressors.  A full 10 point Review of Systems was done, except as stated above, all other Review of  Systems were negative.   Social History History  Substance Use Topics  . Smoking status: Former Research scientist (life sciences)  . Smokeless tobacco: Not on file  . Alcohol Use: No     Family History History reviewed. No pertinent family history. No family Hx of CAD at young age.  Prior to Admission medications   Medication Sig Start Date End Date Taking? Authorizing Provider  busPIRone (BUSPAR) 7.5 MG tablet Take 7.5 mg by mouth 2 (two) times daily.   Yes Historical Provider, MD  colchicine 0.6 MG tablet Take 0.6 mg by mouth 2 (two) times daily.   Yes Historical Provider, MD  glipiZIDE (GLUCOTROL) 10 MG tablet Take 10 mg by mouth every morning.   Yes Historical Provider, MD  insulin glargine (LANTUS) 100 UNIT/ML injection Inject 15-25 Units into the skin 2 (two) times daily. Inject 25 units in the morning and 15 units in the evening   Yes Historical Provider, MD  lactobacillus acidophilus & bulgar (LACTINEX) chewable tablet Chew 1 tablet by mouth 3 (three) times daily with meals. 06/10/13  Yes Margarita Mail,  PA-C  metoprolol (LOPRESSOR) 50 MG tablet Take 50 mg by mouth 2 (two) times daily.   Yes Historical Provider, MD  ondansetron (ZOFRAN) 4 MG tablet Take 1 tablet (4 mg total) by mouth every 8 (eight) hours as needed for nausea or vomiting. 06/10/13  Yes Margarita Mail, PA-C  oxyCODONE-acetaminophen (PERCOCET/ROXICET) 5-325 MG per tablet Take 1 tablet by mouth every 4 (four) hours as needed for pain.   Yes Historical Provider, MD  simvastatin (ZOCOR) 20 MG tablet Take 20 mg by mouth every evening.   Yes Historical Provider, MD  sitaGLIPtin-metformin (JANUMET) 50-500 MG per tablet Take 1 tablet by mouth daily.   Yes Historical Provider, MD  triamterene-hydrochlorothiazide (MAXZIDE-25) 37.5-25 MG per tablet Take 1 tablet by mouth every morning.   Yes Historical Provider, MD    Allergies  Allergen Reactions  . Penicillins Anaphylaxis, Hives and Rash    Physical Exam  Vitals  Blood pressure 186/77, pulse  62, temperature 98.5 F (36.9 C), temperature source Oral, resp. rate 16, SpO2 99.00%.   1. General well-nourished female lying in bed in NAD,    2. Normal affect and insight, Not Suicidal or Homicidal, Awake Alert, Oriented X 3.  3. No F.N deficits, ALL C.Nerves Intact, Strength 5/5 all 4 extremities, Sensation intact all 4 extremities, Plantars down going.  4. Ears and Eyes appear Normal, Conjunctivae clear, PERRLA. Moist Oral Mucosa.  5. Supple Neck, No JVD, No cervical lymphadenopathy appriciated, No Carotid Bruits.  6. Symmetrical Chest wall movement, Good air movement bilaterally, CTAB.  7. RRR, No Gallops, Rubs or Murmurs, No Parasternal Heave. Has reproducible chest pain on palpation, reports its different quality than her initial chest pain  8. Positive Bowel Sounds, Abdomen Soft, Non tender, No organomegaly appriciated,No rebound -guarding or rigidity.  9.  No Cyanosis, Normal Skin Turgor, No Skin Rash or Bruise.  10. Good muscle tone,  joints appear normal , no effusions, Normal ROM.  11. No Palpable Lymph Nodes in Neck or Axillae   Data Review  CBC  Recent Labs Lab 06/10/13 1545 06/13/13 1745  WBC 7.2 9.1  HGB 11.4* 12.1  HCT 35.1* 37.0  PLT 185 215  MCV 89.1 88.1  MCH 28.9 28.8  MCHC 32.5 32.7  RDW 15.3 14.9  LYMPHSABS 2.7  --   MONOABS 0.6  --   EOSABS 0.2  --   BASOSABS 0.0  --    ------------------------------------------------------------------------------------------------------------------  Chemistries   Recent Labs Lab 06/10/13 1545 06/13/13 1745  NA 133* 138  K 4.6 4.3  CL 99 101  CO2 25 24  GLUCOSE 282* 94  BUN 21 15  CREATININE 1.68* 0.98  CALCIUM 9.0 8.8  AST 30 38*  ALT 25 35  ALKPHOS 97 80  BILITOT 0.2* 0.3   ------------------------------------------------------------------------------------------------------------------ CrCl is unknown because both a height and weight (above a minimum accepted value) are required for  this calculation. ------------------------------------------------------------------------------------------------------------------ No results found for this basename: TSH, T4TOTAL, FREET3, T3FREE, THYROIDAB,  in the last 72 hours   Coagulation profile No results found for this basename: INR, PROTIME,  in the last 168 hours ------------------------------------------------------------------------------------------------------------------- No results found for this basename: DDIMER,  in the last 72 hours -------------------------------------------------------------------------------------------------------------------  Cardiac Enzymes  Recent Labs Lab 06/10/13 1545 06/13/13 1745  TROPONINI <0.30 <0.30   ------------------------------------------------------------------------------------------------------------------ No components found with this basename: POCBNP,    ---------------------------------------------------------------------------------------------------------------  Urinalysis    Component Value Date/Time   COLORURINE YELLOW 06/10/2013 1519   APPEARANCEUR CLOUDY* 06/10/2013 1519   LABSPEC  1.013 06/10/2013 1519   PHURINE 6.0 06/10/2013 1519   GLUCOSEU 250* 06/10/2013 1519   HGBUR SMALL* 06/10/2013 1519   BILIRUBINUR NEGATIVE 06/10/2013 1519   KETONESUR NEGATIVE 06/10/2013 1519   PROTEINUR NEGATIVE 06/10/2013 1519   UROBILINOGEN 0.2 06/10/2013 1519   NITRITE NEGATIVE 06/10/2013 1519   LEUKOCYTESUR NEGATIVE 06/10/2013 1519    ----------------------------------------------------------------------------------------------------------------  Imaging results:   Dg Chest 2 View  06/13/2013   CLINICAL DATA:  Chest pain.  EXAM: CHEST  2 VIEW  COMPARISON:  June 10, 2013.  FINDINGS: Stable cardiomediastinal silhouette. Both lungs are clear. No pleural effusion or pneumothorax is noted. The visualized skeletal structures are unremarkable.  IMPRESSION: No active cardiopulmonary disease.    Electronically Signed   By: Sabino Dick M.D.   On: 06/13/2013 17:33   Dg Chest 2 View  06/10/2013   CLINICAL DATA:  Right lower chest pain extending to shoulder, shortness of breath for 1 day, history hypertension, diabetes  EXAM: CHEST  2 VIEW  COMPARISON:  05/04/2013  FINDINGS: Minimal enlargement of cardiac silhouette.  Tortuous aorta.  Pulmonary vascularity normal.  Lungs clear.  No pleural effusion or pneumothorax.  Tiny chronic nodular opacity at the right lung base appears unchanged since 03/15/2009.  Bones demineralized.  IMPRESSION: Minimal enlargement of cardiac silhouette.  No acute abnormalities.   Electronically Signed   By: Lavonia Dana M.D.   On: 06/10/2013 16:24   US Abdomen Complete  06/10/2013   CLINICAL DATA:  Right upper quadrant pain, UTI, history diabetes, hypertension  EXAM: ULTRASOUND ABDOMEN COMPLETE  COMPARISON:  CT abdomen and pelvis 06/02/2013  FINDINGS: Gallbladder:  Normally distended without stones or wall thickening.  No pericholecystic fluid or sonographic Murphy sign.  Common bile duct:  Diameter: Normal caliber 4 mm diameter  Liver:  Inhomogeneous mildly increased echogenicity, question fatty infiltration of this can be seen with cirrhosis uncertain infiltrative disorders. No focal mass lesion.  IVC:  Normal appearance  Pancreas:  Inadequately visualized due to bowel gas  Spleen:  Normal appearance, 2.7 cm length  Right Kidney:  Length: 9.0 cm. Suboptimally visualized due to body habitus. Grossly normal cortical thickness and echogenicity. Small cyst medially 18 x 18 x 15 mm, seen on prior CT. No additional mass or hydronephrosis.  Left Kidney:  Length: 8.4 cm. Appear small and atrophic with cortical thinning. No gross mass or hydronephrosis.  Abdominal aorta:  Normal caliber  Other findings:  No free-fluid  IMPRESSION: Probable fatty infiltration of liver.  Inadequate pancreatic visualization.  Small right renal cyst with small kidneys noted bilaterally.  No acute  abnormalities.   Electronically Signed   By: Lavonia Dana M.D.   On: 06/10/2013 19:22   Ct Abdomen Pelvis W Contrast  06/02/2013   CLINICAL DATA:  Nausea, periumbilical pain, diarrhea  EXAM: CT ABDOMEN AND PELVIS WITH CONTRAST  TECHNIQUE: Multidetector CT imaging of the abdomen and pelvis was performed using the standard protocol following bolus administration of intravenous contrast.  CONTRAST:  11mL OMNIPAQUE IOHEXOL 300 MG/ML SOLN, 168mL OMNIPAQUE IOHEXOL 300 MG/ML SOLN  COMPARISON:  None.  FINDINGS: Sagittal images of the spine shows osteopenia and mild degenerative changes. Lung bases are unremarkable. Enhanced liver, pancreas, spleen and adrenals are unremarkable.  Atherosclerotic calcifications of abdominal aorta and iliac arteries. No aortic aneurysm. Enhanced kidneys are symmetrical in size. There is a lobulated renal contour. A cyst in lower pole of the right kidney measures 1.3 cm. Cyst in lower pole of the left kidney measures 1 cm.  Delayed renal images shows bilateral renal symmetrical excretion. Bilateral visualized proximal ureter is unremarkable.  There is no small bowel obstruction. No calcified gallstones are noted within gallbladder. No ascites or free air. No adenopathy. No pericecal inflammation. The terminal ileum is unremarkable. Normal appendix.  Limited assessment of the colon which is non opacified with contrast empty collapsed. The uterus and adnexa are unremarkable. Vascular calcifications of adnexal vessels. Urinary bladder is unremarkable. Bilateral distal ureter is unremarkable. No inguinal adenopathy. No destructive bony lesions are noted within pelvis.  Nonspecific partially visualized symmetrical mild thickening of vulvar wall. Clinical correlation is necessary.  IMPRESSION: 1. No hydronephrosis or hydroureter.  Bilateral renal cysts. 2. Normal appendix.  No pericecal inflammation. 3. Limited assessment of the colon non opacified with contrast. 4. No small bowel obstruction. 5.  Nonspecific symmetrical thickening of vulvar wall. Clinical correlation is necessary.   Electronically Signed   By: Lahoma Crocker M.D.   On: 06/02/2013 10:49    My personal review of EKG: Rhythm NSR, , no Acute ST changes    Assessment & Plan  Principal Problem:   Chest pain Active Problems:   DM (diabetes mellitus)   Hypertension   Hyperlipidemia    1. chest pain: Currently resolved, no EKG changes, negative troponins, but it due to the fact it did resolve with sublingual nitroglycerin, and her history of diabetes, hypertension, hyperlipidemia, patient will be admitted for further workup, will continue to cycle her cardiac enzymes, we will start her on aspirin, she is already on statin and BB,, will consult cardiology in am. 2. Diabetes mellitus: We'll hold oral hypoglycemic agent,we'll keep her at a low dose Lantus 15 units BID. And start her on insulin sliding scale, and check hemoglobin A1c. 3-hypertension: uncontrolled, will resume her back on her home medication and will add prn hydralazine. 4: Hyperlipidemia: Continue with statin  DVT Prophylaxis Heparin -   AM Labs Ordered, also please review Full Orders  Family Communication: Admission, patients condition and plan of care including tests being ordered have been discussed with the patient and family at [place] indicate understanding and agree with the plan and Code Status.  Code Status full code  Likely DC to  home  Condition GUARDED  Time spent in minutes : 39min    Kimberle Stanfill M.D on 06/13/2013 at 7:37 PM  Between 7am to 7pm - Pager - [phone]  After 7pm go to [email or web address] - password [email or web address]  And look for the night coverage person covering me after hours  [facility] Office  [phone]

## 2013-06-13 NOTE — ED Notes (Signed)
Pt to ED via EMS with c/o center chest pain that radiates to left arm with nausea, onset today, rates pain at the time at 5/10-relieved by nitro. Pt took ASA 325mg  and EMS given nitro x1. Per EMS, EKG unremarkable, BP-158/78, HR-86-sinus, RR-20, CBG-157. No signs of distress noted at arrival, denies pain now.

## 2013-06-13 NOTE — ED Provider Notes (Signed)
CSN: [id number]     Arrival date & time 06/13/13  1545 History   First MD Initiated Contact with Patient 06/13/13 1607     Chief Complaint  Patient presents with  . Chest Pain   (Consider location/radiation/quality/duration/timing/severity/associated sxs/prior Treatment) HPI Comments: 80 yo AA female with cc of CP which started at 1330 while laying in bed.    Pt has IDDM, HTN, hypercholesterolemia, AA, age 89, former smoker.  Pt denies h/o catheterization, GXT or cardiology evaluation.    Pt denies past ER eval for CP.  No h/o travel, blood clots, estrogen use.  She has been seen in ER 06/02/13 for abd pain and 06/10/13 for abd pain.  ER workups negative to include CT and ABD [place].  She was d/c with symptomatic treatment.      Patient is a 80 y.o. female presenting with chest pain. The history is provided by the patient and a relative.  Chest Pain Pain location:  Substernal area Pain quality: aching   Pain radiates to:  L arm Pain radiates to the back: no   Pain severity:  Moderate Onset quality:  Gradual Duration:  3 hours Timing:  Constant (1330 while in bed) Progression:  Resolved Chronicity:  New Context: at rest   Context: not breathing, no drug use, not eating, no intercourse, not lifting, no movement, not raising an arm, no stress and no trauma   Context comment:  Pt was laying in bed and developed L sided CP with radiation to L arm Relieved by:  Aspirin and nitroglycerin (EMS gave NTG and ASA, pt is currently pain free) Worsened by:  Nothing tried Associated symptoms: nausea   Associated symptoms: no abdominal pain, no AICD problem, no altered mental status, no anorexia, no anxiety, no back pain, no claudication, no cough, no diaphoresis, no dizziness, no dysphagia, no fatigue, no fever, no headache, no heartburn, no numbness, no orthopnea, no palpitations, no PND, no shortness of breath, no syncope and not vomiting     Past Medical History  Diagnosis Date  . Diabetes  mellitus without complication   . Hypertension   . High cholesterol   . Anxiety    Past Surgical History  Procedure Laterality Date  . Vaginal polyp removal     History reviewed. No pertinent family history. History  Substance Use Topics  . Smoking status: Former Research scientist (life sciences)  . Smokeless tobacco: Not on file  . Alcohol Use: No   OB History   Grav Para Term Preterm Abortions TAB SAB Ect Mult Living                 Review of Systems  Constitutional: Negative for fever, chills, diaphoresis, activity change, appetite change and fatigue.  HENT: Negative for trouble swallowing.   Respiratory: Negative for cough and shortness of breath.   Cardiovascular: Positive for chest pain. Negative for palpitations, orthopnea, claudication, syncope and PND.  Gastrointestinal: Positive for nausea. Negative for heartburn, vomiting, abdominal pain and anorexia.  Musculoskeletal: Negative for back pain.  Neurological: Negative for dizziness, numbness and headaches.    Allergies  Penicillins  Home Medications   Current Outpatient Rx  Name  Route  Sig  Dispense  Refill  . busPIRone (BUSPAR) 7.5 MG tablet   Oral   Take 7.5 mg by mouth 2 (two) times daily.         . colchicine 0.6 MG tablet   Oral   Take 0.6 mg by mouth 2 (two) times daily.         Marland Kitchen  glipiZIDE (GLUCOTROL) 10 MG tablet   Oral   Take 10 mg by mouth every morning.         . insulin glargine (LANTUS) 100 UNIT/ML injection   Subcutaneous   Inject 15-25 Units into the skin 2 (two) times daily. Inject 25 units in the morning and 15 units in the evening         . lactobacillus acidophilus & bulgar (LACTINEX) chewable tablet   Oral   Chew 1 tablet by mouth 3 (three) times daily with meals.   90 tablet   0   . metoprolol (LOPRESSOR) 50 MG tablet   Oral   Take 50 mg by mouth 2 (two) times daily.         . ondansetron (ZOFRAN) 4 MG tablet   Oral   Take 1 tablet (4 mg total) by mouth every 8 (eight) hours as needed  for nausea or vomiting.   10 tablet   0   . oxyCODONE-acetaminophen (PERCOCET/ROXICET) 5-325 MG per tablet   Oral   Take 1 tablet by mouth every 4 (four) hours as needed for pain.         . simvastatin (ZOCOR) 20 MG tablet   Oral   Take 20 mg by mouth every evening.         . sitaGLIPtin-metformin (JANUMET) 50-500 MG per tablet   Oral   Take 1 tablet by mouth daily.         Marland Kitchen triamterene-hydrochlorothiazide (MAXZIDE-25) 37.5-25 MG per tablet   Oral   Take 1 tablet by mouth every morning.          BP 186/77  Pulse 62  Temp(Src) 98.5 F (36.9 C) (Oral)  Resp 16  SpO2 99% Physical Exam  Nursing note and vitals reviewed. Constitutional: She is oriented to person, place, and time. She appears well-developed and well-nourished.  HENT:  Head: Normocephalic and atraumatic.  Eyes: Conjunctivae are normal. Pupils are equal, round, and reactive to light. Right eye exhibits no discharge. Left eye exhibits no discharge.  Neck: Normal range of motion. Neck supple. No JVD present.  Cardiovascular: Normal rate and regular rhythm.   Pulmonary/Chest: Effort normal and breath sounds normal. No stridor. No respiratory distress. She has no wheezes. She has no rales. Chest wall is not dull to percussion. She exhibits no mass, no tenderness, no bony tenderness, no laceration, no crepitus, no edema and no retraction.    Abdominal: Soft. She exhibits no distension and no mass. There is no tenderness. There is no rebound and no guarding.  Musculoskeletal: Normal range of motion. She exhibits no edema and no tenderness.  Neurological: She is alert and oriented to person, place, and time. She has normal reflexes.  Skin: Skin is warm and dry.    ED Course  Procedures (including critical care time) Labs Review Labs Reviewed  COMPREHENSIVE METABOLIC PANEL - Abnormal; Notable for the following:    Albumin 3.1 (*)    AST 38 (*)    GFR calc non Af Amer 53 (*)    GFR calc Af Amer 62 (*)     All other components within normal limits  CBC  LIPASE, BLOOD  TROPONIN I  POCT I-STAT TROPONIN I   Imaging Review Dg Chest 2 View  06/13/2013   CLINICAL DATA:  Chest pain.  EXAM: CHEST  2 VIEW  COMPARISON:  June 10, 2013.  FINDINGS: Stable cardiomediastinal silhouette. Both lungs are clear. No pleural effusion or pneumothorax is noted. The visualized  skeletal structures are unremarkable.  IMPRESSION: No active cardiopulmonary disease.   Electronically Signed   By: Sabino Dick M.D.   On: 06/13/2013 17:33     Date: 06/13/2013 @ 1554  Rate: 57  Rhythm: normal sinus rhythm  QRS Axis: normal  Intervals: normal  ST/T Wave abnormalities: normal  Conduction Disutrbances:none  Narrative Interpretation:   Old EKG Reviewed: unchanged   Date: 06/13/2013  Rate:56  Rhythm: normal sinus rhythm  QRS Axis: normal  Intervals: normal  ST/T Wave abnormalities: normal  Conduction Disutrbances:none  Narrative Interpretation:   Old EKG Reviewed: unchanged  Results for orders placed during the hospital encounter of 06/13/13  CBC      Result Value Range   WBC 9.1  4.0 - 10.5 K/uL   RBC 4.20  3.87 - 5.11 MIL/uL   Hemoglobin 12.1  12.0 - 15.0 g/dL   HCT 37.0  36.0 - 46.0 %   MCV 88.1  78.0 - 100.0 fL   MCH 28.8  26.0 - 34.0 pg   MCHC 32.7  30.0 - 36.0 g/dL   RDW 14.9  11.5 - 15.5 %   Platelets 215  150 - 400 K/uL  COMPREHENSIVE METABOLIC PANEL      Result Value Range   Sodium 138  137 - 147 mEq/L   Potassium 4.3  3.7 - 5.3 mEq/L   Chloride 101  96 - 112 mEq/L   CO2 24  19 - 32 mEq/L   Glucose, Bld 94  70 - 99 mg/dL   BUN 15  6 - 23 mg/dL   Creatinine, Ser 0.98  0.50 - 1.10 mg/dL   Calcium 8.8  8.4 - 10.5 mg/dL   Total Protein 6.6  6.0 - 8.3 g/dL   Albumin 3.1 (*) 3.5 - 5.2 g/dL   AST 38 (*) 0 - 37 U/L   ALT 35  0 - 35 U/L   Alkaline Phosphatase 80  39 - 117 U/L   Total Bilirubin 0.3  0.3 - 1.2 mg/dL   GFR calc non Af Amer 53 (*) >90 mL/min   GFR calc Af Amer 62 (*) >90 mL/min    LIPASE, BLOOD      Result Value Range   Lipase 15  11 - 59 U/L  TROPONIN I      Result Value Range   Troponin I <0.30  <0.30 ng/mL  POCT I-STAT TROPONIN I      Result Value Range   Troponin i, poc 0.00  0.00 - 0.08 ng/mL   Comment 3                MDM   1. Chest pain at rest   2.  HTN 3.  IDDM 4.  High Cholesterol   80 yo AA female presents with cc of CP.  Pt has multiple cardiac risk factors.  ER workup negative. Plan to consult hospitalist for inpatient r/o.  Pt pain free in ER.    6:58 PM Discussed case with hospitalist. Plan to admit patient to the hospitalist team for further evaluation and ACS rule out.  VSS.  Pt remained pain free in ER.  Pt and family updated on plan.    Elmer Sow, MD 06/13/13 [phone]

## 2013-06-14 DIAGNOSIS — F411 Generalized anxiety disorder: Secondary | ICD-10-CM

## 2013-06-14 LAB — BASIC METABOLIC PANEL
BUN: 14 mg/dL (ref 6–23)
CO2: 27 mEq/L (ref 19–32)
CREATININE: 0.92 mg/dL (ref 0.50–1.10)
Calcium: 8.7 mg/dL (ref 8.4–10.5)
Chloride: 100 mEq/L (ref 96–112)
GFR calc non Af Amer: 58 mL/min — ABNORMAL LOW (ref >90)
GFR, EST AFRICAN AMERICAN: 67 mL/min — AB (ref >90)
Glucose, Bld: 115 mg/dL — ABNORMAL HIGH (ref 70–99)
Potassium: 3.7 mEq/L (ref 3.7–5.3)
Sodium: 137 mEq/L (ref 137–147)

## 2013-06-14 LAB — CK TOTAL AND CKMB (NOT AT ARMC)
CK TOTAL: 31 U/L (ref 7–177)
CK, MB: 1 ng/mL (ref 0.3–4.0)
CK, MB: 0.9 ng/mL (ref 0.3–4.0)
Relative Index: INVALID (ref 0.0–2.5)
Relative Index: INVALID (ref 0.0–2.5)
Total CK: 29 U/L (ref 7–177)

## 2013-06-14 LAB — GLUCOSE, CAPILLARY
Glucose-Capillary: 114 mg/dL — ABNORMAL HIGH (ref 70–99)
Glucose-Capillary: 119 mg/dL — ABNORMAL HIGH (ref 70–99)
Glucose-Capillary: 107 mg/dL — ABNORMAL HIGH (ref 70–99)
Glucose-Capillary: 163 mg/dL — ABNORMAL HIGH (ref 70–99)

## 2013-06-14 LAB — TROPONIN I: Troponin I: <0.3 ng/mL (ref <0.30)

## 2013-06-14 LAB — CBC
HCT: 35.5 % — ABNORMAL LOW (ref 36.0–46.0)
Hemoglobin: 11.8 g/dL — ABNORMAL LOW (ref 12.0–15.0)
MCH: 29.7 pg (ref 26.0–34.0)
MCHC: 33.2 g/dL (ref 30.0–36.0)
MCV: 89.4 fL (ref 78.0–100.0)
PLATELETS: 205 10*3/uL (ref 150–400)
RBC: 3.97 MIL/uL (ref 3.87–5.11)
RDW: 15 % (ref 11.5–15.5)
WBC: 8.3 10*3/uL (ref 4.0–10.5)

## 2013-06-14 LAB — HEMOGLOBIN A1C
HEMOGLOBIN A1C: 8.3 % — AB (ref <5.7)
Mean Plasma Glucose: 192 mg/dL — ABNORMAL HIGH (ref <117)

## 2013-06-14 MED ORDER — ASPIRIN 81 MG PO CHEW
81.0000 mg | CHEWABLE_TABLET | ORAL | Status: AC
Start: 2013-06-15 — End: 2013-06-15
  Administered 2013-06-15: 81 mg via ORAL
  Filled 2013-06-14: qty 1

## 2013-06-14 MED ORDER — SODIUM CHLORIDE 0.9 % IJ SOLN: 3.0000 mL | INTRAMUSCULAR | Status: DC | PRN

## 2013-06-14 MED ORDER — RAMIPRIL 5 MG PO CAPS
5.0000 mg | ORAL_CAPSULE | Freq: Every day | ORAL | Status: DC
  Administered 2013-06-14 – 2013-06-15 (×2): 5 mg via ORAL
  Filled 2013-06-14 (×2): qty 1

## 2013-06-14 MED ORDER — NITROGLYCERIN 2 % TD OINT
0.5000 [in_us] | TOPICAL_OINTMENT | Freq: Four times a day (QID) | TRANSDERMAL | Status: DC
  Administered 2013-06-14 (×2): 0.5 [in_us] via TOPICAL
  Filled 2013-06-14: qty 30

## 2013-06-14 MED ORDER — GLIPIZIDE 10 MG PO TABS
10.0000 mg | ORAL_TABLET | Freq: Every day | ORAL | Status: DC
  Administered 2013-06-14: 10 mg via ORAL
  Filled 2013-06-14 (×3): qty 1

## 2013-06-14 MED ORDER — INSULIN GLARGINE 100 UNIT/ML ~~LOC~~ SOLN
15.0000 [IU] | Freq: Two times a day (BID) | SUBCUTANEOUS | Status: DC
  Administered 2013-06-14 (×2): 15 [IU] via SUBCUTANEOUS
  Filled 2013-06-14 (×5): qty 0.15

## 2013-06-14 MED ORDER — SODIUM CHLORIDE 0.9 % IJ SOLN
3.0000 mL | Freq: Two times a day (BID) | INTRAMUSCULAR | Status: DC
  Administered 2013-06-14 (×2): 3 mL via INTRAVENOUS

## 2013-06-14 MED ORDER — SODIUM CHLORIDE 0.9 % IV SOLN: 250.0000 mL | INTRAVENOUS | Status: DC | PRN

## 2013-06-14 MED ORDER — MORPHINE SULFATE 2 MG/ML IJ SOLN
1.0000 mg | Freq: Once | INTRAMUSCULAR | Status: AC
  Administered 2013-06-14: 1 mg via INTRAVENOUS
  Filled 2013-06-14: qty 1

## 2013-06-14 MED ORDER — SODIUM CHLORIDE 0.9 % IV SOLN
1.0000 mL/kg/h | INTRAVENOUS | Status: DC
  Administered 2013-06-15: 1 mL/kg/h via INTRAVENOUS

## 2013-06-14 MED ORDER — LORAZEPAM 0.5 MG PO TABS
0.5000 mg | ORAL_TABLET | Freq: Once | ORAL | Status: AC
  Administered 2013-06-14: 0.5 mg via ORAL
  Filled 2013-06-14: qty 1

## 2013-06-14 MED ORDER — DIAZEPAM 5 MG PO TABS
5.0000 mg | ORAL_TABLET | ORAL | Status: AC
Start: 2013-06-15 — End: 2013-06-15
  Administered 2013-06-15: 5 mg via ORAL
  Filled 2013-06-14: qty 1

## 2013-06-14 MED ORDER — ALUM & MAG HYDROXIDE-SIMETH 200-200-20 MG/5ML PO SUSP
30.0000 mL | Freq: Once | ORAL | Status: AC
  Administered 2013-06-14: 30 mL via ORAL
  Filled 2013-06-14: qty 30

## 2013-06-14 NOTE — Progress Notes (Signed)
Pt was resting in bed an complained of CP. EKG and V/S obtained. 3 sl nitro given with out relief. NP on call made aware. Will cont to monitor pt.

## 2013-06-14 NOTE — Progress Notes (Signed)
[facility] PROGRESS NOTE  Rhonda Conrad BPZ:[id number] DOB: January 03, 1932 DOA: 06/13/2013 PCP: [name], FNP  Assessment/Plan:  Principal Problem:   Chest pain: MI ruled out. Initially resolved with sublingual nitroglycerin. Had another episode overnight that was not relieved with nitroglycerin. Multiple cardiac risk factors. Has no history of previous stress test. Have consulted cardiology for recommendations. Will start diet. Continue aspirin Active Problems:   DM (diabetes mellitus): Resume glipizide, insulin. We'll January 2.   Hypertension: Continue metoprolol.   Hyperlipidemia: Continue Zocor.   Anxiety state, unspecified: In addition to BuSpar, reportedly takes "nerve pill" at home. Will order Ativan as needed. Continue BuSpar.   Code Status:  full Family Communication:   Disposition Plan:  home  HPI/Subjective: No chest pain this morning. Had another episode of chest pain on the unit that was not relieved by nitroglycerin. Feels anxious.  Objective: Filed Vitals:   06/14/13 0600  BP: 146/60  Pulse: 58  Temp: 98 F (36.7 C)  Resp: 20   No intake or output data in the 24 hours ending 06/14/13 0943 Filed Weights   06/13/13 2041  Weight: 82.01 kg (180 lb 12.8 oz)    Exam:   General:  Alert oriented and appropriate. Comfortable.  Cardiovascular: Regular rate rhythm without murmurs gallops rubs, no chest wall tenderness.  Respiratory: Clear to auscultation bilaterally without wheeze rhonchi or rales  Abdomen: Soft nontender nondistended.  Ext: No clubbing cyanosis or edema. No calf tenderness.  Basic Metabolic Panel:  Recent Labs Lab 06/10/13 1545 06/13/13 1745 06/13/13 2202 06/14/13 0300  NA 133* 138  --  137  K 4.6 4.3  --  3.7  CL 99 101  --  100  CO2 25 24  --  27  GLUCOSE 282* 94  --  115*  BUN 21 15  --  14  CREATININE 1.68* 0.98 0.98 0.92  CALCIUM 9.0 8.8  --  8.7   Liver Function Tests:  Recent Labs Lab 06/10/13 1545  06/13/13 1745  AST 30 38*  ALT 25 35  ALKPHOS 97 80  BILITOT 0.2* 0.3  PROT 6.2 6.6  ALBUMIN 3.0* 3.1*    Recent Labs Lab 06/10/13 1545 06/13/13 1745  LIPASE 19 15   No results found for this basename: AMMONIA,  in the last 168 hours CBC:  Recent Labs Lab 06/10/13 1545 06/13/13 1745 06/13/13 2202 06/14/13 0300  WBC 7.2 9.1 8.7 8.3  NEUTROABS 3.7  --   --   --   HGB 11.4* 12.1 11.8* 11.8*  HCT 35.1* 37.0 36.3 35.5*  MCV 89.1 88.1 88.5 89.4  PLT 185 215 212 205   Cardiac Enzymes:  Recent Labs Lab 06/10/13 1545 06/13/13 1745 06/13/13 2202 06/14/13 0300 06/14/13 0520  CKTOTAL  --   --  34 29  --   CKMB  --   --  1.1 1.0  --   TROPONINI <0.30 <0.30  --   --  <0.30   BNP (last 3 results) No results found for this basename: PROBNP,  in the last 8760 hours CBG:  Recent Labs Lab 06/10/13 1541 06/13/13 2041 06/14/13 0837  GLUCAP 256* 91 114*    No results found for this or any previous visit (from the past 240 hour(s)).   Studies: Dg Chest 2 View  06/13/2013   CLINICAL DATA:  Chest pain.  EXAM: CHEST  2 VIEW  COMPARISON:  June 10, 2013.  FINDINGS: Stable cardiomediastinal silhouette. Both lungs are clear. No pleural effusion or pneumothorax  is noted. The visualized skeletal structures are unremarkable.  IMPRESSION: No active cardiopulmonary disease.   Electronically Signed   By: Sabino Dick M.D.   On: 06/13/2013 17:33    Scheduled Meds: . aspirin EC  81 mg Oral Daily  . busPIRone  7.5 mg Oral BID  . colchicine  0.6 mg Oral BID  . heparin  5,000 Units Subcutaneous Q8H  . insulin aspart  0-15 Units Subcutaneous TID WC  . insulin glargine  15 Units Subcutaneous QHS  . lactobacillus acidophilus & bulgar  1 tablet Oral TID WC  . LORazepam  0.5 mg Oral Once  . metoprolol  50 mg Oral BID  . simvastatin  20 mg Oral QPM  . triamterene-hydrochlorothiazide  1 tablet Oral q morning - 10a   Continuous Infusions:   Time spent: 35 minutes  [facility] Pager [phone]. If 7PM-7AM, please contact night-coverage at [email or web address], password [facility] 06/14/2013, 9:43 AM  LOS: 1 day

## 2013-06-14 NOTE — Consult Note (Signed)
Reason for Consult: Chest pain Referring Physician: [facility]  Rhonda Conrad is an 80 y.o. female.  HPI: Patient is 80 year old female with past medical history significant for insulin requiring diabetes mellitus, hypertension, hypercholesteremia, anxiety disorder, was admitted yesterday because of retrosternal chest pain described as heaviness radiating over to radiating to left shoulder associated with nausea took one aspirin and call EMS and received one sublingual nitroglycerin with relief of chest pain. Patient denies any shortness of breath palpitation lightheadedness or syncope. Denies such episodes of chest pain in the past EKG done in the ER showed no acute ischemic changes and 3 sets of cardiac enzymes are negative . Denies any cardiac workup in the past. Patient denies any relation of chest pain to food breathing or movement. Denies any cough fever or chills.  Past Medical History  Diagnosis Date  . Diabetes mellitus without complication   . Hypertension   . High cholesterol   . Anxiety     Past Surgical History  Procedure Laterality Date  . Vaginal polyp removal      History reviewed. No pertinent family history.  Social History:  reports that she has quit smoking. She does not have any smokeless tobacco history on file. She reports that she does not drink alcohol or use illicit drugs.  Allergies:  Allergies  Allergen Reactions  . Penicillins Anaphylaxis, Hives and Rash    Medications: I have reviewed the patient's current medications.  Results for orders placed during the hospital encounter of 06/13/13 (from the past 48 hour(s))  CBC     Status: None   Collection Time    06/13/13  5:45 PM      Result Value Range   WBC 9.1  4.0 - 10.5 K/uL   RBC 4.20  3.87 - 5.11 MIL/uL   Hemoglobin 12.1  12.0 - 15.0 g/dL   HCT 37.0  36.0 - 46.0 %   MCV 88.1  78.0 - 100.0 fL   MCH 28.8  26.0 - 34.0 pg   MCHC 32.7  30.0 - 36.0 g/dL   RDW 14.9  11.5 - 15.5 %   Platelets 215  150 - 400 K/uL  COMPREHENSIVE METABOLIC PANEL     Status: Abnormal   Collection Time    06/13/13  5:45 PM      Result Value Range   Sodium 138  137 - 147 mEq/L   Potassium 4.3  3.7 - 5.3 mEq/L   Chloride 101  96 - 112 mEq/L   CO2 24  19 - 32 mEq/L   Glucose, Bld 94  70 - 99 mg/dL   BUN 15  6 - 23 mg/dL   Creatinine, Ser 0.98  0.50 - 1.10 mg/dL   Calcium 8.8  8.4 - 10.5 mg/dL   Total Protein 6.6  6.0 - 8.3 g/dL   Albumin 3.1 (*) 3.5 - 5.2 g/dL   AST 38 (*) 0 - 37 U/L   ALT 35  0 - 35 U/L   Alkaline Phosphatase 80  39 - 117 U/L   Total Bilirubin 0.3  0.3 - 1.2 mg/dL   GFR calc non Af Amer 53 (*) >90 mL/min   GFR calc Af Amer 62 (*) >90 mL/min   Comment: (NOTE)     The eGFR has been calculated using the CKD EPI equation.     This calculation has not been validated in all clinical situations.     eGFR's persistently <90 mL/min signify possible Chronic Kidney  Disease.  LIPASE, BLOOD     Status: None   Collection Time    06/13/13  5:45 PM      Result Value Range   Lipase 15  11 - 59 U/L  TROPONIN I     Status: None   Collection Time    06/13/13  5:45 PM      Result Value Range   Troponin I <0.30  <0.30 ng/mL   Comment:            Due to the release kinetics of cTnI,     a negative result within the first hours     of the onset of symptoms does not rule out     myocardial infarction with certainty.     If myocardial infarction is still suspected,     repeat the test at appropriate intervals.  POCT I-STAT TROPONIN I     Status: None   Collection Time    06/13/13  5:51 PM      Result Value Range   Troponin i, poc 0.00  0.00 - 0.08 ng/mL   Comment 3            Comment: Due to the release kinetics of cTnI,     a negative result within the first hours     of the onset of symptoms does not rule out     myocardial infarction with certainty.     If myocardial infarction is still suspected,     repeat the test at appropriate intervals.  GLUCOSE, CAPILLARY      Status: None   Collection Time    06/13/13  8:41 PM      Result Value Range   Glucose-Capillary 91  70 - 99 mg/dL  CBC     Status: Abnormal   Collection Time    06/13/13 10:02 PM      Result Value Range   WBC 8.7  4.0 - 10.5 K/uL   RBC 4.10  3.87 - 5.11 MIL/uL   Hemoglobin 11.8 (*) 12.0 - 15.0 g/dL   HCT 36.3  36.0 - 46.0 %   MCV 88.5  78.0 - 100.0 fL   MCH 28.8  26.0 - 34.0 pg   MCHC 32.5  30.0 - 36.0 g/dL   RDW 14.9  11.5 - 15.5 %   Platelets 212  150 - 400 K/uL  CREATININE, SERUM     Status: Abnormal   Collection Time    06/13/13 10:02 PM      Result Value Range   Creatinine, Ser 0.98  0.50 - 1.10 mg/dL   GFR calc non Af Amer 53 (*) >90 mL/min   GFR calc Af Amer 62 (*) >90 mL/min   Comment: (NOTE)     The eGFR has been calculated using the CKD EPI equation.     This calculation has not been validated in all clinical situations.     eGFR's persistently <90 mL/min signify possible Chronic Kidney     Disease.  CK TOTAL AND CKMB     Status: None   Collection Time    06/13/13 10:02 PM      Result Value Range   Total CK 34  7 - 177 U/L   CK, MB 1.1  0.3 - 4.0 ng/mL   Relative Index RELATIVE INDEX IS INVALID  0.0 - 2.5   Comment: WHEN CK < 100 U/L             CBC  Status: Abnormal   Collection Time    06/14/13  3:00 AM      Result Value Range   WBC 8.3  4.0 - 10.5 K/uL   RBC 3.97  3.87 - 5.11 MIL/uL   Hemoglobin 11.8 (*) 12.0 - 15.0 g/dL   HCT 35.5 (*) 36.0 - 46.0 %   MCV 89.4  78.0 - 100.0 fL   MCH 29.7  26.0 - 34.0 pg   MCHC 33.2  30.0 - 36.0 g/dL   RDW 15.0  11.5 - 15.5 %   Platelets 205  150 - 400 K/uL  BASIC METABOLIC PANEL     Status: Abnormal   Collection Time    06/14/13  3:00 AM      Result Value Range   Sodium 137  137 - 147 mEq/L   Potassium 3.7  3.7 - 5.3 mEq/L   Chloride 100  96 - 112 mEq/L   CO2 27  19 - 32 mEq/L   Glucose, Bld 115 (*) 70 - 99 mg/dL   BUN 14  6 - 23 mg/dL   Creatinine, Ser 0.92  0.50 - 1.10 mg/dL   Calcium 8.7  8.4 - 10.5  mg/dL   GFR calc non Af Amer 58 (*) >90 mL/min   GFR calc Af Amer 67 (*) >90 mL/min   Comment: (NOTE)     The eGFR has been calculated using the CKD EPI equation.     This calculation has not been validated in all clinical situations.     eGFR's persistently <90 mL/min signify possible Chronic Kidney     Disease.  CK TOTAL AND CKMB     Status: None   Collection Time    06/14/13  3:00 AM      Result Value Range   Total CK 29  7 - 177 U/L   CK, MB 1.0  0.3 - 4.0 ng/mL   Relative Index RELATIVE INDEX IS INVALID  0.0 - 2.5   Comment: WHEN CK < 100 U/L             TROPONIN I     Status: None   Collection Time    06/14/13  5:20 AM      Result Value Range   Troponin I <0.30  <0.30 ng/mL   Comment:            Due to the release kinetics of cTnI,     a negative result within the first hours     of the onset of symptoms does not rule out     myocardial infarction with certainty.     If myocardial infarction is still suspected,     repeat the test at appropriate intervals.  GLUCOSE, CAPILLARY     Status: Abnormal   Collection Time    06/14/13  8:37 AM      Result Value Range   Glucose-Capillary 114 (*) 70 - 99 mg/dL  CK TOTAL AND CKMB     Status: None   Collection Time    06/14/13  9:16 AM      Result Value Range   Total CK 31  7 - 177 U/L   CK, MB 0.9  0.3 - 4.0 ng/mL   Relative Index RELATIVE INDEX IS INVALID  0.0 - 2.5   Comment: WHEN CK < 100 U/L             GLUCOSE, CAPILLARY     Status: Abnormal   Collection Time  06/14/13 11:51 AM      Result Value Range   Glucose-Capillary 119 (*) 70 - 99 mg/dL    Dg Chest 2 View  06/13/2013   CLINICAL DATA:  Chest pain.  EXAM: CHEST  2 VIEW  COMPARISON:  June 10, 2013.  FINDINGS: Stable cardiomediastinal silhouette. Both lungs are clear. No pleural effusion or pneumothorax is noted. The visualized skeletal structures are unremarkable.  IMPRESSION: No active cardiopulmonary disease.   Electronically Signed   By: Sabino Dick M.D.    On: 06/13/2013 17:33    Review of Systems  Constitutional: Negative for fever and chills.  Eyes: Negative for double vision and photophobia.  Respiratory: Negative for cough and hemoptysis.   Cardiovascular: Positive for chest pain. Negative for palpitations, orthopnea and claudication.  Gastrointestinal: Positive for nausea. Negative for vomiting, abdominal pain and diarrhea.  Genitourinary: Negative for dysuria.  Neurological: Negative for dizziness and headaches.   Blood pressure 170/63, pulse 71, temperature 98 F (36.7 C), temperature source Oral, resp. rate 20, height 5' 2" (1.575 m), weight 82.01 kg (180 lb 12.8 oz), SpO2 100.00%. Physical Exam  Constitutional: She is oriented to person, place, and time.  HENT:  Head: Normocephalic and atraumatic.  Eyes: Conjunctivae are normal. Left eye exhibits no discharge. No scleral icterus.  Neck: Normal range of motion. Neck supple. No JVD present. No tracheal deviation present. No thyromegaly present.  Cardiovascular: Normal rate and regular rhythm.   Murmur (Soft systolic murmur and S4 gallop noted) heard. Respiratory: Effort normal and breath sounds normal. No respiratory distress. She has no wheezes. She has no rales.  GI: Soft. Bowel sounds are normal. She exhibits no distension.  Musculoskeletal: She exhibits no edema and no tenderness.  Neurological: She is alert and oriented to person, place, and time.    Assessment/Plan: Status post chest pain worrisome for angina MI ruled out Hypertension Insulin requiring diabetes mellitus Hypercholesteremia Remote tobacco abuse History of gouty arthritis Anxiety disorder Plan Discussed with patient at length regarding various options of treatment i.e. noninvasive stress testing versus left cath possible PTCA stenting its risk and benefits i.e. death MI stroke need for emergency CABG local last complications etc. and consented for PCI. Will add low-dose nitrates and hold off  diuretics  for now  [facility] 06/14/2013, 12:07 PM

## 2013-06-14 NOTE — Progress Notes (Signed)
[facility] progress note. Chief complaint. Chest pain. History of present illness. This 80 year old female was admitted overnight with complaints of chest pain. She described midsternal pain radiating to the left shoulder. Pain resolved after receiving sublingual nitroglycerin by EMS. Patient was admitted to rule out cardiac ischemia. A troponin done in the emergency room was negative. Patient again complained of chest pain on the floor and was given 3 sublingual nitroglycerin without relief. An EKG  was obtained and this indicates normal sinus rhythm without ischemia. I came to see the patient at bedside. She describes a burning pain from the epigastric/low sternal area radiating to the left shoulder. I was able to reproduce the pain in part with gentle pressure. Vital signs. Temperature 98.1, pulse 69, respiration 20, blood pressure 164/88. O2 sats 99%. General appearance. Well-developed elderly female who is alert, cooperative, and in no distress. Cardiac. Rate and rhythm regular. Lungs. Reduced but clear. Abdomen. Soft with positive bowel sounds. No pain with palpation. Musculoskeletal. There was some pain with gentle touch pressure over the left shoulder area. Patient states this reproduced in part be cardiac pain that she was experiencing. Impression/plan. Problem #1. Chest pain. Patient's bedside EKG shows normal sinus rhythm without suggestion of ischemia. I will add troponin now and then every 6 hours to the current CK-MB orders. I will give the patient 1 mg of morphine for pain and 30 cc of Maalox for possible reflux.

## 2013-06-14 NOTE — Progress Notes (Signed)
Utilization review completed.  

## 2013-06-15 ENCOUNTER — Encounter (HOSPITAL_COMMUNITY)
Admission: EM | Disposition: A | Discharge status: Home / Self Care | Payer: Self-pay | Source: Home / Self Care | Attending: Internal Medicine

## 2013-06-15 HISTORY — PX: LEFT HEART CATHETERIZATION WITH CORONARY ANGIOGRAM: SHX5451

## 2013-06-15 LAB — GLUCOSE, CAPILLARY
GLUCOSE-CAPILLARY: 96 mg/dL (ref 70–99)
GLUCOSE-CAPILLARY: 116 mg/dL — AB (ref 70–99)

## 2013-06-15 LAB — PROTIME-INR
INR: 1.04 (ref 0.00–1.49)
Prothrombin Time: 13.4 seconds (ref 11.6–15.2)

## 2013-06-15 SURGERY — LEFT HEART CATHETERIZATION WITH CORONARY ANGIOGRAM
Anesthesia: LOCAL

## 2013-06-15 MED ORDER — ONDANSETRON HCL 4 MG/2ML IJ SOLN: 4.0000 mg | Freq: Four times a day (QID) | INTRAMUSCULAR | Status: DC | PRN

## 2013-06-15 MED ORDER — NITROGLYCERIN 0.2 MG/ML ON CALL CATH LAB
INTRAVENOUS | Status: AC
  Filled 2013-06-15: qty 1

## 2013-06-15 MED ORDER — ACETAMINOPHEN 325 MG PO TABS: 650.0000 mg | ORAL_TABLET | ORAL | Status: DC | PRN

## 2013-06-15 MED ORDER — ASPIRIN 81 MG PO TBEC: 81.0000 mg | DELAYED_RELEASE_TABLET | Freq: Every day | ORAL | Status: DC

## 2013-06-15 MED ORDER — MIDAZOLAM HCL 2 MG/2ML IJ SOLN
INTRAMUSCULAR | Status: AC
  Filled 2013-06-15: qty 2

## 2013-06-15 MED ORDER — SODIUM CHLORIDE 0.9 % IV SOLN: INTRAVENOUS | Status: DC

## 2013-06-15 MED ORDER — FENTANYL CITRATE 0.05 MG/ML IJ SOLN
INTRAMUSCULAR | Status: AC
  Filled 2013-06-15: qty 2

## 2013-06-15 MED ORDER — HEPARIN (PORCINE) IN NACL 2-0.9 UNIT/ML-% IJ SOLN
INTRAMUSCULAR | Status: AC
  Filled 2013-06-15: qty 1000

## 2013-06-15 MED ORDER — LIDOCAINE HCL (PF) 1 % IJ SOLN
INTRAMUSCULAR | Status: AC
  Filled 2013-06-15: qty 30

## 2013-06-15 NOTE — Discharge Summary (Signed)
Physician Discharge Summary  Rhonda Conrad DGL:[id number] DOB: January 11, 1932 DOA: 06/13/2013  PCP: Rhonda Conrad., FNP  Admit date: 06/13/2013 Discharge date: 06/15/2013  Time greater than 30 min  Discharge Diagnoses:  Principal Problem:   Chest pain Active Problems:   DM (diabetes mellitus)   Hypertension   Hyperlipidemia   Anxiety state, unspecified   Discharge Condition: stable  Filed Weights   06/13/13 2041  Weight: 82.01 kg (180 lb 12.8 oz)    History of present illness:  80 y.o. female, with no known history of coronary artery disease, presents with complaints of chest pain, She describes it as midsternal sharp quality radiating to the upper shoulder, it started at rest, no previous history of chest pain, resolved when she received sublingual nitroglycerin by EMS, as well he received a 324 mg of aspirin in the ambulance, currently denies any chest pain, patient EKG did not show any acute changes, troponin is negative, she is known to have history of diabetes mellitus, hyperlipidemia, and hypertension, given that hospitalist requested to admit the patient for further evaluation of her chest pain.   Hospital Course:  Admitted to telemetry. MI ruled out. Cardiology consulted and recommended cardiac catheterization. Cath report pending, but per Dr. Terrence Dupont, no significant CAD and cleared for discharge. Pt will f/u with Dr. Terrence Dupont in 2 weekns  Procedures:  Cardiac catheterization  Consultations:  harwani  Discharge Exam: Filed Vitals:   06/15/13 1315  BP: 161/79  Pulse: 68  Temp:   Resp:    Cardiovascular: Respiratory: CTA Groin without bleeding or hematoma  Discharge Instructions  Discharge Orders   Future Orders Complete By Expires   Activity as tolerated - No restrictions  As directed    Diet - low sodium heart healthy  As directed    Diet Carb Modified  As directed        Medication List         aspirin 81 MG EC tablet  Take 1 tablet (81 mg  total) by mouth daily.     busPIRone 7.5 MG tablet  Commonly known as:  BUSPAR  Take 7.5 mg by mouth 2 (two) times daily.     colchicine 0.6 MG tablet  Take 0.6 mg by mouth 2 (two) times daily.     glipiZIDE 10 MG tablet  Commonly known as:  GLUCOTROL  Take 10 mg by mouth every morning.     insulin glargine 100 UNIT/ML injection  Commonly known as:  LANTUS  Inject 15-25 Units into the skin 2 (two) times daily. Inject 25 units in the morning and 15 units in the evening     lactobacillus acidophilus & bulgar chewable tablet  Chew 1 tablet by mouth 3 (three) times daily with meals.     metoprolol 50 MG tablet  Commonly known as:  LOPRESSOR  Take 50 mg by mouth 2 (two) times daily.     ondansetron 4 MG tablet  Commonly known as:  ZOFRAN  Take 1 tablet (4 mg total) by mouth every 8 (eight) hours as needed for nausea or vomiting.     oxyCODONE-acetaminophen 5-325 MG per tablet  Commonly known as:  PERCOCET/ROXICET  Take 1 tablet by mouth every 4 (four) hours as needed for pain.     simvastatin 20 MG tablet  Commonly known as:  ZOCOR  Take 20 mg by mouth every evening.     sitaGLIPtin-metformin 50-500 MG per tablet  Commonly known as:  JANUMET  Take 1 tablet by mouth daily.  triamterene-hydrochlorothiazide 37.5-25 MG per tablet  Commonly known as:  MAXZIDE-25  Take 1 tablet by mouth every morning.       Allergies  Allergen Reactions  . Penicillins Anaphylaxis, Hives and Rash       Follow-up Information   Follow up with Rhonda Conrad., FNP In 2 weeks.   Specialty:  Nurse Practitioner   Contact information:   [address] [place] [address] [phone]        The results of significant diagnostics from this hospitalization (including imaging, microbiology, ancillary and laboratory) are listed below for reference.    Significant Diagnostic Studies: Dg Chest 2 View  06/13/2013   CLINICAL DATA:  Chest pain.  EXAM: CHEST  2 VIEW  COMPARISON:   June 10, 2013.  FINDINGS: Stable cardiomediastinal silhouette. Both lungs are clear. No pleural effusion or pneumothorax is noted. The visualized skeletal structures are unremarkable.  IMPRESSION: No active cardiopulmonary disease.   Electronically Signed   By: Sabino Dick M.D.   On: 06/13/2013 17:33   Dg Chest 2 View  06/10/2013   CLINICAL DATA:  Right lower chest pain extending to shoulder, shortness of breath for 1 day, history hypertension, diabetes  EXAM: CHEST  2 VIEW  COMPARISON:  05/04/2013  FINDINGS: Minimal enlargement of cardiac silhouette.  Tortuous aorta.  Pulmonary vascularity normal.  Lungs clear.  No pleural effusion or pneumothorax.  Tiny chronic nodular opacity at the right lung base appears unchanged since 03/15/2009.  Bones demineralized.  IMPRESSION: Minimal enlargement of cardiac silhouette.  No acute abnormalities.   Electronically Signed   By: Lavonia Dana M.D.   On: 06/10/2013 16:24   US Abdomen Complete  06/10/2013   CLINICAL DATA:  Right upper quadrant pain, UTI, history diabetes, hypertension  EXAM: ULTRASOUND ABDOMEN COMPLETE  COMPARISON:  CT abdomen and pelvis 06/02/2013  FINDINGS: Gallbladder:  Normally distended without stones or wall thickening.  No pericholecystic fluid or sonographic Murphy sign.  Common bile duct:  Diameter: Normal caliber 4 mm diameter  Liver:  Inhomogeneous mildly increased echogenicity, question fatty infiltration of this can be seen with cirrhosis uncertain infiltrative disorders. No focal mass lesion.  IVC:  Normal appearance  Pancreas:  Inadequately visualized due to bowel gas  Spleen:  Normal appearance, 2.7 cm length  Right Kidney:  Length: 9.0 cm. Suboptimally visualized due to body habitus. Grossly normal cortical thickness and echogenicity. Small cyst medially 18 x 18 x 15 mm, seen on prior CT. No additional mass or hydronephrosis.  Left Kidney:  Length: 8.4 cm. Appear small and atrophic with cortical thinning. No gross mass or hydronephrosis.   Abdominal aorta:  Normal caliber  Other findings:  No free-fluid  IMPRESSION: Probable fatty infiltration of liver.  Inadequate pancreatic visualization.  Small right renal cyst with small kidneys noted bilaterally.  No acute abnormalities.   Electronically Signed   By: Lavonia Dana M.D.   On: 06/10/2013 19:22   Ct Abdomen Pelvis W Contrast  06/02/2013   CLINICAL DATA:  Nausea, periumbilical pain, diarrhea  EXAM: CT ABDOMEN AND PELVIS WITH CONTRAST  TECHNIQUE: Multidetector CT imaging of the abdomen and pelvis was performed using the standard protocol following bolus administration of intravenous contrast.  CONTRAST:  51mL OMNIPAQUE IOHEXOL 300 MG/ML SOLN, 147mL OMNIPAQUE IOHEXOL 300 MG/ML SOLN  COMPARISON:  None.  FINDINGS: Sagittal images of the spine shows osteopenia and mild degenerative changes. Lung bases are unremarkable. Enhanced liver, pancreas, spleen and adrenals are unremarkable.  Atherosclerotic calcifications of abdominal aorta  and iliac arteries. No aortic aneurysm. Enhanced kidneys are symmetrical in size. There is a lobulated renal contour. A cyst in lower pole of the right kidney measures 1.3 cm. Cyst in lower pole of the left kidney measures 1 cm.  Delayed renal images shows bilateral renal symmetrical excretion. Bilateral visualized proximal ureter is unremarkable.  There is no small bowel obstruction. No calcified gallstones are noted within gallbladder. No ascites or free air. No adenopathy. No pericecal inflammation. The terminal ileum is unremarkable. Normal appendix.  Limited assessment of the colon which is non opacified with contrast empty collapsed. The uterus and adnexa are unremarkable. Vascular calcifications of adnexal vessels. Urinary bladder is unremarkable. Bilateral distal ureter is unremarkable. No inguinal adenopathy. No destructive bony lesions are noted within pelvis.  Nonspecific partially visualized symmetrical mild thickening of vulvar wall. Clinical correlation is  necessary.  IMPRESSION: 1. No hydronephrosis or hydroureter.  Bilateral renal cysts. 2. Normal appendix.  No pericecal inflammation. 3. Limited assessment of the colon non opacified with contrast. 4. No small bowel obstruction. 5. Nonspecific symmetrical thickening of vulvar wall. Clinical correlation is necessary.   Electronically Signed   By: Lahoma Crocker M.D.   On: 06/02/2013 10:49   EKG NSR  Microbiology: No results found for this or any previous visit (from the past 240 hour(s)).   Labs: Basic Metabolic Panel:  Recent Labs Lab 06/10/13 1545 06/13/13 1745 06/13/13 2202 06/14/13 0300  NA 133* 138  --  137  K 4.6 4.3  --  3.7  CL 99 101  --  100  CO2 25 24  --  27  GLUCOSE 282* 94  --  115*  BUN 21 15  --  14  CREATININE 1.68* 0.98 0.98 0.92  CALCIUM 9.0 8.8  --  8.7   Liver Function Tests:  Recent Labs Lab 06/10/13 1545 06/13/13 1745  AST 30 38*  ALT 25 35  ALKPHOS 97 80  BILITOT 0.2* 0.3  PROT 6.2 6.6  ALBUMIN 3.0* 3.1*    Recent Labs Lab 06/10/13 1545 06/13/13 1745  LIPASE 19 15   No results found for this basename: AMMONIA,  in the last 168 hours CBC:  Recent Labs Lab 06/10/13 1545 06/13/13 1745 06/13/13 2202 06/14/13 0300  WBC 7.2 9.1 8.7 8.3  NEUTROABS 3.7  --   --   --   HGB 11.4* 12.1 11.8* 11.8*  HCT 35.1* 37.0 36.3 35.5*  MCV 89.1 88.1 88.5 89.4  PLT 185 215 212 205   Cardiac Enzymes:  Recent Labs Lab 06/10/13 1545 06/13/13 1745 06/13/13 2202 06/14/13 0300 06/14/13 0520 06/14/13 0916  CKTOTAL  --   --  34 29  --  31  CKMB  --   --  1.1 1.0  --  0.9  TROPONINI <0.30 <0.30  --   --  <0.30  --    BNP: BNP (last 3 results) No results found for this basename: PROBNP,  in the last 8760 hours CBG:  Recent Labs Lab 06/14/13 0837 06/14/13 1151 06/14/13 1611 06/14/13 2024 06/15/13 0728  GLUCAP 114* 119* 107* 163* 96       Signed:  Jaysha Lasure L  [facility] 06/15/2013, 2:03 PM

## 2013-06-15 NOTE — Progress Notes (Signed)
Subjective:   Patient denies any chest pain or shortness of breath. Underwent left cardiac cath cardiac today tolerated procedure well noted to have mild coronary artery disease. Denies any right groin pain or swelling.  Objective:  Vital Signs in the last 24 hours: Temp:  [98.2 F (36.8 C)-98.6 F (37 C)] 98.2 F (36.8 C) (01/12 1230) Pulse Rate:  [68-76] 74 (01/12 1500) Resp:  [16-18] 18 (01/12 1230) BP: (116-172)/(64-107) 135/107 mmHg (01/12 1500) SpO2:  [98 %-100 %] 99 % (01/12 1230)  Intake/Output from previous day: 01/11 0701 - 01/12 0700 In: 726 [P.O.:720; I.V.:6] Out: -  Intake/Output from this shift: Total I/O In: 360 [P.O.:360] Out: 2 [Urine:2]  Physical Exam: Neck: no adenopathy, no carotid bruit, no JVD and supple, symmetrical, trachea midline Lungs: clear to auscultation bilaterally Heart: regular rate and rhythm, S1, S2 normal and Soft systolic murmur noted Abdomen: soft, non-tender; bowel sounds normal; no masses,  no organomegaly Extremities: extremities normal, atraumatic, no cyanosis or edema and Right groin stable no evidence of hematoma or bruit  Lab Results:  Recent Labs  06/13/13 2202 06/14/13 0300  WBC 8.7 8.3  HGB 11.8* 11.8*  PLT 212 205    Recent Labs  06/13/13 1745 06/13/13 2202 06/14/13 0300  NA 138  --  137  K 4.3  --  3.7  CL 101  --  100  CO2 24  --  27  GLUCOSE 94  --  115*  BUN 15  --  14  CREATININE 0.98 0.98 0.92    Recent Labs  06/13/13 1745 06/14/13 0520  TROPONINI <0.30 <0.30   Hepatic Function Panel  Recent Labs  06/13/13 1745  PROT 6.6  ALBUMIN 3.1*  AST 38*  ALT 35  ALKPHOS 80  BILITOT 0.3   No results found for this basename: CHOL,  in the last 72 hours No results found for this basename: PROTIME,  in the last 72 hours  Imaging: Imaging results have been reviewed and Dg Chest 2 View  06/13/2013   CLINICAL DATA:  Chest pain.  EXAM: CHEST  2 VIEW  COMPARISON:  June 10, 2013.  FINDINGS: Stable  cardiomediastinal silhouette. Both lungs are clear. No pleural effusion or pneumothorax is noted. The visualized skeletal structures are unremarkable.  IMPRESSION: No active cardiopulmonary disease.   Electronically Signed   By: Sabino Dick M.D.   On: 06/13/2013 17:33    Cardiac Studies:  Assessment/Plan:  Status post chest pain MI ruled out status post left cardiac cath Mild coronary artery disease Hypertension  Insulin requiring diabetes mellitus  Hypercholesteremia  Remote tobacco abuse  History of gouty arthritis  Anxiety disorder Plan Okay to discharge from cardiac point of view Followup with me in one week Post cardiac cath instructions have been given.  LOS: 2 days    Vipul Cafarelli N 06/15/2013, 4:47 PM

## 2013-06-15 NOTE — CV Procedure (Signed)
Left cardiac cath report dictated on 06/15/2013 dictation number is [phone]

## 2013-06-15 NOTE — Interval H&P Note (Signed)
**Note Rhonda-Identified via Obfuscation** Cath Lab Visit (complete for each Cath Lab visit)  Clinical Evaluation Leading to the Procedure:   ACS: yes  Non-ACS:    Anginal Classification: CCS IV  Anti-ischemic medical therapy: Minimal Therapy (1 class of medications)  Non-Invasive Test Results: No non-invasive testing performed  Prior CABG: No previous CABG      History and Physical Interval Note:  06/15/2013 11:01 AM  Rhonda Conrad  has presented today for surgery, with the diagnosis of cp  The various methods of treatment have been discussed with the patient and family. After consideration of risks, benefits and other options for treatment, the patient has consented to  Procedure(s): LEFT HEART CATHETERIZATION WITH CORONARY ANGIOGRAM (N/A) as a surgical intervention .  The patient's history has been reviewed, patient examined, no change in status, stable for surgery.  I have reviewed the patient's chart and labs.  Questions were answered to the patient's satisfaction.     Clent Demark

## 2013-06-15 NOTE — H&P (View-Only) (Signed)
Reason for Consult: Chest pain Referring Physician: [facility]  Rhonda Conrad is an 80 y.o. female.  HPI: Patient is 80 year old female with past medical history significant for insulin requiring diabetes mellitus, hypertension, hypercholesteremia, anxiety disorder, was admitted yesterday because of retrosternal chest pain described as heaviness radiating over to radiating to left shoulder associated with nausea took one aspirin and call EMS and received one sublingual nitroglycerin with relief of chest pain. Patient denies any shortness of breath palpitation lightheadedness or syncope. Denies such episodes of chest pain in the past EKG done in the ER showed no acute ischemic changes and 3 sets of cardiac enzymes are negative . Denies any cardiac workup in the past. Patient denies any relation of chest pain to food breathing or movement. Denies any cough fever or chills.  Past Medical History  Diagnosis Date  . Diabetes mellitus without complication   . Hypertension   . High cholesterol   . Anxiety     Past Surgical History  Procedure Laterality Date  . Vaginal polyp removal      History reviewed. No pertinent family history.  Social History:  reports that she has quit smoking. She does not have any smokeless tobacco history on file. She reports that she does not drink alcohol or use illicit drugs.  Allergies:  Allergies  Allergen Reactions  . Penicillins Anaphylaxis, Hives and Rash    Medications: I have reviewed the patient's current medications.  Results for orders placed during the hospital encounter of 06/13/13 (from the past 48 hour(s))  CBC     Status: None   Collection Time    06/13/13  5:45 PM      Result Value Range   WBC 9.1  4.0 - 10.5 K/uL   RBC 4.20  3.87 - 5.11 MIL/uL   Hemoglobin 12.1  12.0 - 15.0 g/dL   HCT 37.0  36.0 - 46.0 %   MCV 88.1  78.0 - 100.0 fL   MCH 28.8  26.0 - 34.0 pg   MCHC 32.7  30.0 - 36.0 g/dL   RDW 14.9  11.5 - 15.5 %   Platelets 215  150 - 400 K/uL  COMPREHENSIVE METABOLIC PANEL     Status: Abnormal   Collection Time    06/13/13  5:45 PM      Result Value Range   Sodium 138  137 - 147 mEq/L   Potassium 4.3  3.7 - 5.3 mEq/L   Chloride 101  96 - 112 mEq/L   CO2 24  19 - 32 mEq/L   Glucose, Bld 94  70 - 99 mg/dL   BUN 15  6 - 23 mg/dL   Creatinine, Ser 0.98  0.50 - 1.10 mg/dL   Calcium 8.8  8.4 - 10.5 mg/dL   Total Protein 6.6  6.0 - 8.3 g/dL   Albumin 3.1 (*) 3.5 - 5.2 g/dL   AST 38 (*) 0 - 37 U/L   ALT 35  0 - 35 U/L   Alkaline Phosphatase 80  39 - 117 U/L   Total Bilirubin 0.3  0.3 - 1.2 mg/dL   GFR calc non Af Amer 53 (*) >90 mL/min   GFR calc Af Amer 62 (*) >90 mL/min   Comment: (NOTE)     The eGFR has been calculated using the CKD EPI equation.     This calculation has not been validated in all clinical situations.     eGFR's persistently <90 mL/min signify possible Chronic Kidney  Disease.  LIPASE, BLOOD     Status: None   Collection Time    06/13/13  5:45 PM      Result Value Range   Lipase 15  11 - 59 U/L  TROPONIN I     Status: None   Collection Time    06/13/13  5:45 PM      Result Value Range   Troponin I <0.30  <0.30 ng/mL   Comment:            Due to the release kinetics of cTnI,     a negative result within the first hours     of the onset of symptoms does not rule out     myocardial infarction with certainty.     If myocardial infarction is still suspected,     repeat the test at appropriate intervals.  POCT I-STAT TROPONIN I     Status: None   Collection Time    06/13/13  5:51 PM      Result Value Range   Troponin i, poc 0.00  0.00 - 0.08 ng/mL   Comment 3            Comment: Due to the release kinetics of cTnI,     a negative result within the first hours     of the onset of symptoms does not rule out     myocardial infarction with certainty.     If myocardial infarction is still suspected,     repeat the test at appropriate intervals.  GLUCOSE, CAPILLARY      Status: None   Collection Time    06/13/13  8:41 PM      Result Value Range   Glucose-Capillary 91  70 - 99 mg/dL  CBC     Status: Abnormal   Collection Time    06/13/13 10:02 PM      Result Value Range   WBC 8.7  4.0 - 10.5 K/uL   RBC 4.10  3.87 - 5.11 MIL/uL   Hemoglobin 11.8 (*) 12.0 - 15.0 g/dL   HCT 36.3  36.0 - 46.0 %   MCV 88.5  78.0 - 100.0 fL   MCH 28.8  26.0 - 34.0 pg   MCHC 32.5  30.0 - 36.0 g/dL   RDW 14.9  11.5 - 15.5 %   Platelets 212  150 - 400 K/uL  CREATININE, SERUM     Status: Abnormal   Collection Time    06/13/13 10:02 PM      Result Value Range   Creatinine, Ser 0.98  0.50 - 1.10 mg/dL   GFR calc non Af Amer 53 (*) >90 mL/min   GFR calc Af Amer 62 (*) >90 mL/min   Comment: (NOTE)     The eGFR has been calculated using the CKD EPI equation.     This calculation has not been validated in all clinical situations.     eGFR's persistently <90 mL/min signify possible Chronic Kidney     Disease.  CK TOTAL AND CKMB     Status: None   Collection Time    06/13/13 10:02 PM      Result Value Range   Total CK 34  7 - 177 U/L   CK, MB 1.1  0.3 - 4.0 ng/mL   Relative Index RELATIVE INDEX IS INVALID  0.0 - 2.5   Comment: WHEN CK < 100 U/L             CBC       Status: Abnormal   Collection Time    06/14/13  3:00 AM      Result Value Range   WBC 8.3  4.0 - 10.5 K/uL   RBC 3.97  3.87 - 5.11 MIL/uL   Hemoglobin 11.8 (*) 12.0 - 15.0 g/dL   HCT 35.5 (*) 36.0 - 46.0 %   MCV 89.4  78.0 - 100.0 fL   MCH 29.7  26.0 - 34.0 pg   MCHC 33.2  30.0 - 36.0 g/dL   RDW 15.0  11.5 - 15.5 %   Platelets 205  150 - 400 K/uL  BASIC METABOLIC PANEL     Status: Abnormal   Collection Time    06/14/13  3:00 AM      Result Value Range   Sodium 137  137 - 147 mEq/L   Potassium 3.7  3.7 - 5.3 mEq/L   Chloride 100  96 - 112 mEq/L   CO2 27  19 - 32 mEq/L   Glucose, Bld 115 (*) 70 - 99 mg/dL   BUN 14  6 - 23 mg/dL   Creatinine, Ser 0.92  0.50 - 1.10 mg/dL   Calcium 8.7  8.4 - 10.5  mg/dL   GFR calc non Af Amer 58 (*) >90 mL/min   GFR calc Af Amer 67 (*) >90 mL/min   Comment: (NOTE)     The eGFR has been calculated using the CKD EPI equation.     This calculation has not been validated in all clinical situations.     eGFR's persistently <90 mL/min signify possible Chronic Kidney     Disease.  CK TOTAL AND CKMB     Status: None   Collection Time    06/14/13  3:00 AM      Result Value Range   Total CK 29  7 - 177 U/L   CK, MB 1.0  0.3 - 4.0 ng/mL   Relative Index RELATIVE INDEX IS INVALID  0.0 - 2.5   Comment: WHEN CK < 100 U/L             TROPONIN I     Status: None   Collection Time    06/14/13  5:20 AM      Result Value Range   Troponin I <0.30  <0.30 ng/mL   Comment:            Due to the release kinetics of cTnI,     a negative result within the first hours     of the onset of symptoms does not rule out     myocardial infarction with certainty.     If myocardial infarction is still suspected,     repeat the test at appropriate intervals.  GLUCOSE, CAPILLARY     Status: Abnormal   Collection Time    06/14/13  8:37 AM      Result Value Range   Glucose-Capillary 114 (*) 70 - 99 mg/dL  CK TOTAL AND CKMB     Status: None   Collection Time    06/14/13  9:16 AM      Result Value Range   Total CK 31  7 - 177 U/L   CK, MB 0.9  0.3 - 4.0 ng/mL   Relative Index RELATIVE INDEX IS INVALID  0.0 - 2.5   Comment: WHEN CK < 100 U/L             GLUCOSE, CAPILLARY     Status: Abnormal   Collection Time      06/14/13 11:51 AM      Result Value Range   Glucose-Capillary 119 (*) 70 - 99 mg/dL    Dg Chest 2 View  06/13/2013   CLINICAL DATA:  Chest pain.  EXAM: CHEST  2 VIEW  COMPARISON:  June 10, 2013.  FINDINGS: Stable cardiomediastinal silhouette. Both lungs are clear. No pleural effusion or pneumothorax is noted. The visualized skeletal structures are unremarkable.  IMPRESSION: No active cardiopulmonary disease.   Electronically Signed   By: Sabino Dick M.D.    On: 06/13/2013 17:33    Review of Systems  Constitutional: Negative for fever and chills.  Eyes: Negative for double vision and photophobia.  Respiratory: Negative for cough and hemoptysis.   Cardiovascular: Positive for chest pain. Negative for palpitations, orthopnea and claudication.  Gastrointestinal: Positive for nausea. Negative for vomiting, abdominal pain and diarrhea.  Genitourinary: Negative for dysuria.  Neurological: Negative for dizziness and headaches.   Blood pressure 170/63, pulse 71, temperature 98 F (36.7 C), temperature source Oral, resp. rate 20, height 5' 2" (1.575 m), weight 82.01 kg (180 lb 12.8 oz), SpO2 100.00%. Physical Exam  Constitutional: She is oriented to person, place, and time.  HENT:  Head: Normocephalic and atraumatic.  Eyes: Conjunctivae are normal. Left eye exhibits no discharge. No scleral icterus.  Neck: Normal range of motion. Neck supple. No JVD present. No tracheal deviation present. No thyromegaly present.  Cardiovascular: Normal rate and regular rhythm.   Murmur (Soft systolic murmur and S4 gallop noted) heard. Respiratory: Effort normal and breath sounds normal. No respiratory distress. She has no wheezes. She has no rales.  GI: Soft. Bowel sounds are normal. She exhibits no distension.  Musculoskeletal: She exhibits no edema and no tenderness.  Neurological: She is alert and oriented to person, place, and time.    Assessment/Plan: Status post chest pain worrisome for angina MI ruled out Hypertension Insulin requiring diabetes mellitus Hypercholesteremia Remote tobacco abuse History of gouty arthritis Anxiety disorder Plan Discussed with patient at length regarding various options of treatment i.e. noninvasive stress testing versus left cath possible PTCA stenting its risk and benefits i.e. death MI stroke need for emergency CABG local last complications etc. and consented for PCI. Will add low-dose nitrates and hold off  diuretics  for now  [facility] N 06/14/2013, 12:07 PM

## 2013-06-16 LAB — GLUCOSE, CAPILLARY: Glucose-Capillary: 85 mg/dL (ref 70–99)

## 2013-06-16 NOTE — Cardiovascular Report (Signed)
NAMESIRENA, RIDDLE NO.:  [id number]  MEDICAL RECORD NO.:  [id number]  LOCATION:  3W30C                        FACILITY:  [facility]  PHYSICIAN:  Demir Titsworth N. Terrence Dupont, M.D. DATE OF BIRTH:  1931/07/14  DATE OF PROCEDURE: DATE OF DISCHARGE:  06/15/2013                           CARDIAC CATHETERIZATION   PROCEDURE:  Left cardiac catheterization with selective left and right coronary angiography, left ventriculography via right groin using Judkins technique.  INDICATION FOR THE PROCEDURE:  Rhonda Conrad is a 80 year old female with past medical history significant for insulin-requiring diabetes mellitus, hypertension, hypercholesteremia, anxiety disorder, was admitted two days ago because of retrosternal chest pain described as heaviness, radiating to the left shoulder associated with nausea.  She took aspirin and called EMS, received 1 sublingual nitro with relief of chest pain.  The patient denies any shortness of breath, palpitation, lightheadedness, or syncope.  Denies such episodes of chest pain in the past.  EKG done in the ER showed no acute ischemic changes.  Three sets of cardiac enzymes were negative.  Denies any cardiac workup in the past.  Denies any relation of chest pain to food, breathing, or movement.  Denies any cough, fever, or chills.  Due to typical anginal chest pain, multiple risk factors, her age, discussed with the patient various options of treatment, i.e., noninvasive stress testing versus left cath, possible PTCA, stenting, its risks and benefits, i.e., death, MI, stroke, need for emergency CABG, risk of restenosis, local vascular complications, etc., and consented for PCI.  DESCRIPTION OF PROCEDURE:  After obtaining the informed consent, the patient was brought to the cath lab and was placed on fluoroscopy table. Right groin was prepped and draped in usual fashion.  A 1% Xylocaine was used for local anesthesia in the right groin.  With the help  of thin wall needle, a 6-French arterial sheath was placed.  The sheath was aspirated and flushed.  Next, 6-French left Judkins catheter was advanced over the wire under fluoroscopic guidance up to the ascending aorta.  Wire was pulled out.  The catheter was aspirated and connected to the Manifold.  Catheter was further advanced and engaged into left coronary ostium.  Multiple views of the left system were taken.  Next, the catheter was disengaged and was pulled out over the wire and was replaced with 6-French right Judkins catheter which was advanced over the wire under fluoroscopic guidance up to the ascending aorta.  Wire was pulled out.  The catheter was aspirated and connected to the Manifold.  Catheter was further advanced and engaged into right coronary ostium.  Multiple views of the right system were taken.  Next, the catheter was disengaged and was pulled out over the wire and was replaced with 6-French pigtail catheter which was advanced over the wire up to the ascending aorta.  Catheter was further advanced across the aortic valve into the LV.  LV pressures were recorded.  Next, LV graft was done in 30-degree RAO position.  Post-angiographic pressures were recorded from LV and then pullback pressures were recorded from the aorta.  There was no gradient across the aortic valve.  Next, the pigtail catheter was pulled out over the wire.  Sheaths were aspirated and  flushed.  FINDINGS: 1. LV showed good LV systolic function. 2. Left main was patent. 3. LAD was patent. 4. Diagonal 1 to diagonal 3 were very, very small which were patent. 5. Left circumflex had 10% to 15% mid stenosis. 6. OM-1 was very, very small.  OM-2 was moderate sized, which was     patent.  OM-3 was small which was     patent. 7. RCA was patent.  PDA and PLV branches were patent.  The patient     tolerated procedure well.  There were no complications.  The     patient was transferred to the recovery room in  stable condition.     Allegra Lai. Terrence Dupont, M.D.     MNH/MEDQ  D:  06/15/2013  T:  06/16/2013  Job:  [id number]

## 2013-06-24 ENCOUNTER — Encounter (HOSPITAL_COMMUNITY): Payer: Self-pay | Admitting: Emergency Medicine

## 2013-06-24 ENCOUNTER — Emergency Department (HOSPITAL_COMMUNITY)
Admission: EM | Admit: 2013-06-24 | Discharge: 2013-06-24 | Disposition: A | Discharge status: Home / Self Care | Payer: PRIVATE HEALTH INSURANCE | Attending: Emergency Medicine | Admitting: Emergency Medicine

## 2013-06-24 ENCOUNTER — Emergency Department (HOSPITAL_COMMUNITY): Payer: PRIVATE HEALTH INSURANCE

## 2013-06-24 DIAGNOSIS — Z87891 Personal history of nicotine dependence: Secondary | ICD-10-CM | POA: Insufficient documentation

## 2013-06-24 DIAGNOSIS — Z880 Allergy status to penicillin: Secondary | ICD-10-CM | POA: Insufficient documentation

## 2013-06-24 DIAGNOSIS — Z794 Long term (current) use of insulin: Secondary | ICD-10-CM | POA: Insufficient documentation

## 2013-06-24 DIAGNOSIS — F411 Generalized anxiety disorder: Secondary | ICD-10-CM | POA: Insufficient documentation

## 2013-06-24 DIAGNOSIS — I10 Essential (primary) hypertension: Secondary | ICD-10-CM | POA: Insufficient documentation

## 2013-06-24 DIAGNOSIS — E119 Type 2 diabetes mellitus without complications: Secondary | ICD-10-CM | POA: Insufficient documentation

## 2013-06-24 DIAGNOSIS — R51 Headache: Secondary | ICD-10-CM | POA: Insufficient documentation

## 2013-06-24 DIAGNOSIS — R519 Headache, unspecified: Secondary | ICD-10-CM

## 2013-06-24 DIAGNOSIS — Z7982 Long term (current) use of aspirin: Secondary | ICD-10-CM | POA: Insufficient documentation

## 2013-06-24 DIAGNOSIS — Z79899 Other long term (current) drug therapy: Secondary | ICD-10-CM | POA: Insufficient documentation

## 2013-06-24 DIAGNOSIS — E7800 Pure hypercholesterolemia, unspecified: Secondary | ICD-10-CM | POA: Insufficient documentation

## 2013-06-24 LAB — CBC
HCT: 38.2 % (ref 36.0–46.0)
HEMOGLOBIN: 12.3 g/dL (ref 12.0–15.0)
MCH: 29.1 pg (ref 26.0–34.0)
MCHC: 32.2 g/dL (ref 30.0–36.0)
MCV: 90.5 fL (ref 78.0–100.0)
Platelets: 224 10*3/uL (ref 150–400)
RBC: 4.22 MIL/uL (ref 3.87–5.11)
RDW: 14.7 % (ref 11.5–15.5)
WBC: 9.3 10*3/uL (ref 4.0–10.5)

## 2013-06-24 LAB — BASIC METABOLIC PANEL
BUN: 21 mg/dL (ref 6–23)
CO2: 23 meq/L (ref 19–32)
CREATININE: 0.94 mg/dL (ref 0.50–1.10)
Calcium: 9.1 mg/dL (ref 8.4–10.5)
Chloride: 103 mEq/L (ref 96–112)
GFR calc Af Amer: 65 mL/min — ABNORMAL LOW (ref >90)
GFR calc non Af Amer: 56 mL/min — ABNORMAL LOW (ref >90)
GLUCOSE: 86 mg/dL (ref 70–99)
Potassium: 4.3 mEq/L (ref 3.7–5.3)
SODIUM: 140 meq/L (ref 137–147)

## 2013-06-24 MED ORDER — OXYCODONE-ACETAMINOPHEN 5-325 MG PO TABS
1.0000 | ORAL_TABLET | Freq: Once | ORAL | Status: AC
  Administered 2013-06-24: 1 via ORAL
  Filled 2013-06-24: qty 1

## 2013-06-24 NOTE — Discharge Instructions (Signed)
Hypertension Hypertension is another name for high blood pressure. High blood pressure may mean that your heart needs to work harder to pump blood. Blood pressure consists of two numbers, which includes a higher number over a lower number (example: 110/72). HOME CARE   Make lifestyle changes as told by your doctor. This may include weight loss and exercise.  Take your blood pressure medicine every day.  Limit how much salt you use.  Stop smoking if you smoke.  Do not use drugs.  Talk to your doctor if you are using decongestants or birth control pills. These medicines might make blood pressure higher.  Females should not drink more than 1 alcoholic drink per day. Males should not drink more than 2 alcoholic drinks per day.  See your doctor as told. GET HELP RIGHT AWAY IF:   You have a blood pressure reading with a top number of 180 or higher.  You get a very bad headache.  You get blurred or changing vision.  You feel confused.  You feel weak, numb, or faint.  You get chest or belly (abdominal) pain.  You throw up (vomit).  You cannot breathe very well. MAKE SURE YOU:   Understand these instructions.  Will watch your condition.  Will get help right away if you are not doing well or get worse. Document Released: 11/07/2007 Document Revised: 08/13/2011 Document Reviewed: 11/07/2007 [employer] Patient Information 2014 [employer].  

## 2013-06-24 NOTE — ED Notes (Signed)
Pt from home with c/o hypertension.  Pt sts she started having a headache today that did not subside.  Pt checked BP at home and it was in the 200's.  EMS initial BP on arrival was 240/90 manually.  Upon arrival to ED, pt's BP 180/80 with EMS.  Pt sts she is compliant with blood pressure medication.  Pt sts headache has subsided some; headache initially 10/10, currently 8/10.  No neuro deficits noted.  Pt CAOx4; MAEx4.

## 2013-06-24 NOTE — ED Provider Notes (Signed)
CSN: [id number]     Arrival date & time 06/24/13  1720 History   First MD Initiated Contact with Patient 06/24/13 1723     Chief Complaint  Patient presents with  . Hypertension   (Consider location/radiation/quality/duration/timing/severity/associated sxs/prior Treatment) HPI Comments: Pt states that he has a headache today that has not subsided:pt denies this being the worst headache of her life:pt state that she was seen by Dr. Terrence Dupont yesterday and was started on a new bp medication but it was still high at home:pt states that she needs something more:denies cp,sob, fever:pt states that she took some tylenol with minimal relief:denies visual changes  The history is provided by the patient. No language interpreter was used.    Past Medical History  Diagnosis Date  . Diabetes mellitus without complication   . Hypertension   . High cholesterol   . Anxiety    Past Surgical History  Procedure Laterality Date  . Vaginal polyp removal     History reviewed. No pertinent family history. History  Substance Use Topics  . Smoking status: Former Research scientist (life sciences)  . Smokeless tobacco: Not on file  . Alcohol Use: No   OB History   Grav Para Term Preterm Abortions TAB SAB Ect Mult Living                 Review of Systems  Constitutional: Negative.   Respiratory: Negative.   Cardiovascular: Negative.     Allergies  Penicillins  Home Medications   Current Outpatient Rx  Name  Route  Sig  Dispense  Refill  . aspirin EC 81 MG EC tablet   Oral   Take 1 tablet (81 mg total) by mouth daily.         . busPIRone (BUSPAR) 7.5 MG tablet   Oral   Take 7.5 mg by mouth 2 (two) times daily.         . colchicine 0.6 MG tablet   Oral   Take 0.6 mg by mouth 2 (two) times daily.         Marland Kitchen glipiZIDE (GLUCOTROL) 10 MG tablet   Oral   Take 10 mg by mouth every morning.         . insulin glargine (LANTUS) 100 UNIT/ML injection   Subcutaneous   Inject 15-25 Units into the skin 2 (two)  times daily. Inject 25 units in the morning and 15 units in the evening         . lactobacillus acidophilus & bulgar (LACTINEX) chewable tablet   Oral   Chew 1 tablet by mouth 3 (three) times daily with meals.   90 tablet   0   . metoprolol (LOPRESSOR) 50 MG tablet   Oral   Take 50 mg by mouth 2 (two) times daily.         . ondansetron (ZOFRAN) 4 MG tablet   Oral   Take 1 tablet (4 mg total) by mouth every 8 (eight) hours as needed for nausea or vomiting.   10 tablet   0   . oxyCODONE-acetaminophen (PERCOCET/ROXICET) 5-325 MG per tablet   Oral   Take 1 tablet by mouth every 4 (four) hours as needed for pain.         . simvastatin (ZOCOR) 20 MG tablet   Oral   Take 20 mg by mouth every evening.         . sitaGLIPtin-metformin (JANUMET) 50-500 MG per tablet   Oral   Take 1 tablet by mouth daily.         Marland Kitchen  triamterene-hydrochlorothiazide (MAXZIDE-25) 37.5-25 MG per tablet   Oral   Take 1 tablet by mouth every morning.          BP 184/76  Pulse 62  Temp(Src) 98.8 F (37.1 C) (Oral)  Resp 14  SpO2 99% Physical Exam  Nursing note and vitals reviewed. Constitutional: She is oriented to person, place, and time. She appears well-developed and well-nourished.  HENT:  Head: Normocephalic and atraumatic.  Eyes: Conjunctivae and EOM are normal. Pupils are equal, round, and reactive to light.  Cardiovascular: Normal rate and regular rhythm.   Pulmonary/Chest: Effort normal and breath sounds normal.  Abdominal: Soft. Bowel sounds are normal. There is no tenderness.  Musculoskeletal: Normal range of motion.  Neurological: She is alert and oriented to person, place, and time. Coordination normal.  Skin: Skin is warm and dry.  Psychiatric: She has a normal mood and affect.    ED Course  Procedures (including critical care time) Labs Review Labs Reviewed  BASIC METABOLIC PANEL - Abnormal; Notable for the following:    GFR calc non Af Amer 56 (*)    GFR calc Af  Amer 65 (*)    All other components within normal limits  CBC   Imaging Review Ct Head Wo Contrast  06/24/2013   CLINICAL DATA:  Headache and hypertension.  EXAM: CT HEAD WITHOUT CONTRAST  TECHNIQUE: Contiguous axial images were obtained from the base of the skull through the vertex without intravenous contrast.  COMPARISON:  CT HEAD W/O CM dated 02/01/2013  FINDINGS: Sinuses/Soft tissues: Hyperostosis frontalis interna. Hypoplastic frontal sinuses. Other paranasal sinuses and mastoid air cells normal.  Intracranial: Mild low density in the periventricular white matter likely related to small vessel disease. No mass lesion, hemorrhage, hydrocephalus, acute infarct, intra-axial, or extra-axial fluid collection.  IMPRESSION: 1.  No acute intracranial abnormality. 2. Mild small vessel ischemic change.   Electronically Signed   By: Abigail Miyamoto M.D.   On: 06/24/2013 19:17    EKG Interpretation   None       MDM   1. HTN (hypertension)   2. Headache    Pts headache has resolved:pt is okay to go home:don't think anything new needs to be added as she just stated a new bp medication yesterday:pt has appointment with pcp tomorrow    Glendell Docker, NP 06/24/13 2018

## 2013-06-25 NOTE — ED Provider Notes (Signed)
Medical screening examination/treatment/procedure(s) were performed by non-physician practitioner and as supervising physician I was immediately available for consultation/collaboration.  EKG Interpretation   None         Merryl Hacker, MD 06/25/13 1141

## 2014-05-04 ENCOUNTER — Encounter (HOSPITAL_COMMUNITY): Payer: Self-pay | Admitting: Nurse Practitioner

## 2014-05-04 ENCOUNTER — Emergency Department (HOSPITAL_COMMUNITY)
Admission: EM | Admit: 2014-05-04 | Discharge: 2014-05-05 | Disposition: A | Discharge status: Home / Self Care | Payer: PRIVATE HEALTH INSURANCE | Attending: Emergency Medicine | Admitting: Emergency Medicine

## 2014-05-04 DIAGNOSIS — Z794 Long term (current) use of insulin: Secondary | ICD-10-CM | POA: Insufficient documentation

## 2014-05-04 DIAGNOSIS — E780 Pure hypercholesterolemia: Secondary | ICD-10-CM | POA: Insufficient documentation

## 2014-05-04 DIAGNOSIS — R5381 Other malaise: Secondary | ICD-10-CM | POA: Diagnosis present

## 2014-05-04 DIAGNOSIS — Z7982 Long term (current) use of aspirin: Secondary | ICD-10-CM | POA: Insufficient documentation

## 2014-05-04 DIAGNOSIS — F419 Anxiety disorder, unspecified: Secondary | ICD-10-CM | POA: Insufficient documentation

## 2014-05-04 DIAGNOSIS — Z880 Allergy status to penicillin: Secondary | ICD-10-CM | POA: Insufficient documentation

## 2014-05-04 DIAGNOSIS — I10 Essential (primary) hypertension: Secondary | ICD-10-CM | POA: Diagnosis not present

## 2014-05-04 DIAGNOSIS — Z79899 Other long term (current) drug therapy: Secondary | ICD-10-CM | POA: Diagnosis not present

## 2014-05-04 DIAGNOSIS — E119 Type 2 diabetes mellitus without complications: Secondary | ICD-10-CM | POA: Insufficient documentation

## 2014-05-04 DIAGNOSIS — R197 Diarrhea, unspecified: Secondary | ICD-10-CM | POA: Diagnosis not present

## 2014-05-04 DIAGNOSIS — Z87891 Personal history of nicotine dependence: Secondary | ICD-10-CM | POA: Diagnosis not present

## 2014-05-04 DIAGNOSIS — R110 Nausea: Secondary | ICD-10-CM | POA: Insufficient documentation

## 2014-05-04 NOTE — ED Notes (Signed)
Pt presents from, c/o generally "not feeling good." Denies pain, H/A, n/v/d. States she needs check out.

## 2014-05-04 NOTE — ED Notes (Signed)
Bed: WA06 Expected date:  Expected time:  Means of arrival:  Comments: Bed 6, EMS, 48 F, Diarrhea/Weakness

## 2014-05-05 ENCOUNTER — Emergency Department (HOSPITAL_COMMUNITY): Payer: PRIVATE HEALTH INSURANCE

## 2014-05-05 DIAGNOSIS — R5381 Other malaise: Secondary | ICD-10-CM | POA: Diagnosis not present

## 2014-05-05 LAB — CBC
HCT: 40.9 % (ref 36.0–46.0)
Hemoglobin: 13.5 g/dL (ref 12.0–15.0)
MCH: 29.2 pg (ref 26.0–34.0)
MCHC: 33 g/dL (ref 30.0–36.0)
MCV: 88.5 fL (ref 78.0–100.0)
Platelets: 203 10*3/uL (ref 150–400)
RBC: 4.62 MIL/uL (ref 3.87–5.11)
RDW: 13.7 % (ref 11.5–15.5)
WBC: 9.7 10*3/uL (ref 4.0–10.5)

## 2014-05-05 LAB — COMPREHENSIVE METABOLIC PANEL
ALT: 31 U/L (ref 0–35)
ANION GAP: 14 (ref 5–15)
AST: 38 U/L — ABNORMAL HIGH (ref 0–37)
Albumin: 3.6 g/dL (ref 3.5–5.2)
Alkaline Phosphatase: 73 U/L (ref 39–117)
BUN: 23 mg/dL (ref 6–23)
CO2: 24 meq/L (ref 19–32)
CREATININE: 1.14 mg/dL — AB (ref 0.50–1.10)
Calcium: 9.4 mg/dL (ref 8.4–10.5)
Chloride: 104 mEq/L (ref 96–112)
GFR calc Af Amer: 51 mL/min — ABNORMAL LOW (ref >90)
GFR, EST NON AFRICAN AMERICAN: 44 mL/min — AB (ref >90)
Glucose, Bld: 133 mg/dL — ABNORMAL HIGH (ref 70–99)
Potassium: 4.6 mEq/L (ref 3.7–5.3)
Sodium: 142 mEq/L (ref 137–147)
Total Bilirubin: 0.4 mg/dL (ref 0.3–1.2)
Total Protein: 7.5 g/dL (ref 6.0–8.3)

## 2014-05-05 LAB — URINALYSIS, ROUTINE W REFLEX MICROSCOPIC
BILIRUBIN URINE: NEGATIVE
Glucose, UA: NEGATIVE mg/dL
Hgb urine dipstick: NEGATIVE
KETONES UR: NEGATIVE mg/dL
NITRITE: NEGATIVE
PROTEIN: NEGATIVE mg/dL
Specific Gravity, Urine: 1.012 (ref 1.005–1.030)
UROBILINOGEN UA: 0.2 mg/dL (ref 0.0–1.0)
pH: 5.5 (ref 5.0–8.0)

## 2014-05-05 LAB — URINE MICROSCOPIC-ADD ON

## 2014-05-05 LAB — TROPONIN I: Troponin I: <0.3 ng/mL (ref <0.30)

## 2014-05-05 LAB — LIPASE, BLOOD: LIPASE: 16 U/L (ref 11–59)

## 2014-05-05 NOTE — ED Provider Notes (Signed)
CSN: [id number]     Arrival date & time 05/04/14  2328 History   First MD Initiated Contact with Patient 05/04/14 2350     Chief Complaint  Patient presents with  . General Malaise      (Consider location/radiation/quality/duration/timing/severity/associated sxs/prior Treatment) HPI  Pt with hx of dm, htn, elevated cholesterol presents with c/o generally not feeling well today.  She states that she had several loose stools this morning, but these have resolved.  She has had some nausea, but no vomiting.  No difficulty breathing, no chest pain.  She has been drinking a lot of water today.  No abdominal pain.  No fever/chills.  Denies dysuria.  She also states that her blood sugar was reading in the 500s and then 300s, when EMS arrived cbg was under 200.  She feels her meter may not be working properly.  There are no other associated systemic symptoms, there are no other alleviating or modifying factors.    Past Medical History  Diagnosis Date  . Diabetes mellitus without complication   . Hypertension   . High cholesterol   . Anxiety    Past Surgical History  Procedure Laterality Date  . Vaginal polyp removal     History reviewed. No pertinent family history. History  Substance Use Topics  . Smoking status: Former Research scientist (life sciences)  . Smokeless tobacco: Not on file  . Alcohol Use: No   OB History    No data available     Review of Systems  ROS reviewed and all otherwise negative except for mentioned in HPI    Allergies  Penicillins  Home Medications   Prior to Admission medications   Medication Sig Start Date End Date Taking? Authorizing Provider  ALPRAZolam Duanne Moron) 0.5 MG tablet Take 0.5 mg by mouth 3 (three) times daily as needed for anxiety.  02/12/14  Yes Historical Provider, MD  aspirin EC 81 MG EC tablet Take 1 tablet (81 mg total) by mouth daily. 06/15/13  Yes Delfina Redwood, MD  glipiZIDE (GLUCOTROL) 10 MG tablet Take 10 mg by mouth every morning.   Yes Historical  Provider, MD  insulin glargine (LANTUS) 100 UNIT/ML injection Inject 15-25 Units into the skin 2 (two) times daily. Inject 25 units in the morning and 15 units in the evening   Yes Historical Provider, MD  lactobacillus acidophilus & bulgar (LACTINEX) chewable tablet Chew 1 tablet by mouth 3 (three) times daily with meals. 06/10/13  Yes Margarita Mail, PA-C  metoprolol (LOPRESSOR) 50 MG tablet Take 50 mg by mouth 2 (two) times daily.   Yes Historical Provider, MD  ondansetron (ZOFRAN) 4 MG tablet Take 1 tablet (4 mg total) by mouth every 8 (eight) hours as needed for nausea or vomiting. 06/10/13  Yes Margarita Mail, PA-C  oxyCODONE-acetaminophen (PERCOCET) 7.5-325 MG per tablet Take 0.5-1 tablets by mouth 2 (two) times daily as needed for pain.   Yes Historical Provider, MD  simvastatin (ZOCOR) 20 MG tablet Take 20 mg by mouth every evening.   Yes Historical Provider, MD  sitaGLIPtin-metformin (JANUMET) 50-500 MG per tablet Take 1 tablet by mouth daily.   Yes Historical Provider, MD  triamterene-hydrochlorothiazide (MAXZIDE-25) 37.5-25 MG per tablet Take 1 tablet by mouth every morning.   Yes Historical Provider, MD   BP 160/66 mmHg  Pulse 65  Temp(Src) 98.6 F (37 C) (Oral)  Resp 18  Ht 5\' 3"  (1.6 m)  Wt 180 lb (81.647 kg)  BMI 31.89 kg/m2  SpO2 97%  Vitals reviewed  Physical Exam  Physical Examination: General appearance - alert, well appearing, and in no distress Mental status - alert, oriented to person, place, and time Eyes - no conjunctival injection, no scleral icterus Mouth - mucous membranes moist, pharynx normal without lesions Chest - clear to auscultation, no wheezes, rales or rhonchi, symmetric air entry Heart - normal rate, regular rhythm, normal S1, S2, no murmurs, rubs, clicks or gallops Abdomen - soft, nontender, nondistended, no masses or organomegaly, nabs Extremities - peripheral pulses normal, no pedal edema, no clubbing or cyanosis Skin - normal coloration and turgor, no  rashes  ED Course  Procedures (including critical care time) Labs Review Labs Reviewed  COMPREHENSIVE METABOLIC PANEL - Abnormal; Notable for the following:    Glucose, Bld 133 (*)    Creatinine, Ser 1.14 (*)    AST 38 (*)    GFR calc non Af Amer 44 (*)    GFR calc Af Amer 51 (*)    All other components within normal limits  URINALYSIS, ROUTINE W REFLEX MICROSCOPIC - Abnormal; Notable for the following:    Leukocytes, UA MODERATE (*)    All other components within normal limits  URINE MICROSCOPIC-ADD ON - Abnormal; Notable for the following:    Squamous Epithelial / LPF MANY (*)    All other components within normal limits  CBC  LIPASE, BLOOD  TROPONIN I    Imaging Review Dg Chest 2 View  05/05/2014   CLINICAL DATA:  Acute onset of malaise. Current history of diabetes. Initial encounter.  EXAM: CHEST  2 VIEW  COMPARISON:  Chest radiograph performed 06/13/2013  FINDINGS: The lungs are well-aerated and clear. There is no evidence of focal opacification, pleural effusion or pneumothorax.  The heart is borderline enlarged. No acute osseous abnormalities are seen.  IMPRESSION: No acute cardiopulmonary process seen; borderline cardiomegaly noted.   Electronically Signed   By: Garald Balding M.D.   On: 05/05/2014 02:08     EKG Interpretation   Date/Time:  Wednesday May 05 2014 00:41:22 EST Ventricular Rate:  70 PR Interval:  169 QRS Duration: 100 QT Interval:  382 QTC Calculation: 412 R Axis:   11 Text Interpretation:  Sinus rhythm Consider left atrial enlargement No  significant change since last tracing Confirmed by [facility]  MD, MARTHA  [id number]) on 05/05/2014 2:47:46 AM      MDM   Final diagnoses:  Malaise  Diarrhea    Pt presenting with c/o malaise, diarrhea- which has resolved, nausea.  Her workup in the ED is reassuring, including EKG, troponin, CXR, ua, blood glucose.  Pt is overall well appearing, abdominal exam is benign.  She is happy to know that her results  are reassuring, she will arrange for followup appointment with Dr. Terrence Dupont this week.  Discharged with strict return precautions.  Pt agreeable with plan.    Threasa Beards, MD 05/05/14 [id number]

## 2014-05-05 NOTE — ED Notes (Signed)
Writer called main lab for add-on Troponin

## 2014-05-05 NOTE — Discharge Instructions (Signed)
Return to the ED with any concerns including chest pain, difficulty breathing, vomiting and not able to keep down liquids, abdominal pain, fainting, decreased level of alertness/lethargy, or any other alarming symptoms

## 2014-05-13 ENCOUNTER — Encounter (HOSPITAL_COMMUNITY): Payer: Self-pay | Admitting: Cardiology

## 2014-12-08 ENCOUNTER — Encounter (HOSPITAL_COMMUNITY): Payer: Self-pay | Admitting: Emergency Medicine

## 2014-12-08 ENCOUNTER — Emergency Department (HOSPITAL_COMMUNITY)
Admission: EM | Admit: 2014-12-08 | Discharge: 2014-12-09 | Disposition: A | Discharge status: Home / Self Care | Payer: Medicare Other | Attending: Emergency Medicine | Admitting: Emergency Medicine

## 2014-12-08 DIAGNOSIS — Z87891 Personal history of nicotine dependence: Secondary | ICD-10-CM | POA: Insufficient documentation

## 2014-12-08 DIAGNOSIS — R5383 Other fatigue: Secondary | ICD-10-CM | POA: Insufficient documentation

## 2014-12-08 DIAGNOSIS — N390 Urinary tract infection, site not specified: Secondary | ICD-10-CM

## 2014-12-08 DIAGNOSIS — E119 Type 2 diabetes mellitus without complications: Secondary | ICD-10-CM | POA: Diagnosis not present

## 2014-12-08 DIAGNOSIS — Z79899 Other long term (current) drug therapy: Secondary | ICD-10-CM | POA: Insufficient documentation

## 2014-12-08 DIAGNOSIS — Z880 Allergy status to penicillin: Secondary | ICD-10-CM | POA: Diagnosis not present

## 2014-12-08 DIAGNOSIS — F419 Anxiety disorder, unspecified: Secondary | ICD-10-CM | POA: Insufficient documentation

## 2014-12-08 DIAGNOSIS — R112 Nausea with vomiting, unspecified: Secondary | ICD-10-CM

## 2014-12-08 DIAGNOSIS — Z7982 Long term (current) use of aspirin: Secondary | ICD-10-CM | POA: Insufficient documentation

## 2014-12-08 DIAGNOSIS — E780 Pure hypercholesterolemia: Secondary | ICD-10-CM | POA: Diagnosis not present

## 2014-12-08 DIAGNOSIS — I10 Essential (primary) hypertension: Secondary | ICD-10-CM | POA: Insufficient documentation

## 2014-12-08 LAB — COMPREHENSIVE METABOLIC PANEL
ALT: 21 U/L (ref 14–54)
ANION GAP: 8 (ref 5–15)
AST: 25 U/L (ref 15–41)
Albumin: 3.9 g/dL (ref 3.5–5.0)
Alkaline Phosphatase: 57 U/L (ref 38–126)
BILIRUBIN TOTAL: 0.7 mg/dL (ref 0.3–1.2)
BUN: 18 mg/dL (ref 6–20)
CO2: 24 mmol/L (ref 22–32)
Calcium: 9.7 mg/dL (ref 8.9–10.3)
Chloride: 106 mmol/L (ref 101–111)
Creatinine, Ser: 1.02 mg/dL — ABNORMAL HIGH (ref 0.44–1.00)
GFR calc Af Amer: 58 mL/min — ABNORMAL LOW (ref >60)
GFR, EST NON AFRICAN AMERICAN: 50 mL/min — AB (ref >60)
Glucose, Bld: 91 mg/dL (ref 65–99)
Potassium: 3.8 mmol/L (ref 3.5–5.1)
Sodium: 138 mmol/L (ref 135–145)
Total Protein: 7.7 g/dL (ref 6.5–8.1)

## 2014-12-08 LAB — CBC WITH DIFFERENTIAL/PLATELET
BASOS PCT: 0 % (ref 0–1)
Basophils Absolute: 0 10*3/uL (ref 0.0–0.1)
Eosinophils Absolute: 0.1 10*3/uL (ref 0.0–0.7)
Eosinophils Relative: 1 % (ref 0–5)
HEMATOCRIT: 42 % (ref 36.0–46.0)
HEMOGLOBIN: 14 g/dL (ref 12.0–15.0)
LYMPHS PCT: 46 % (ref 12–46)
Lymphs Abs: 4.4 10*3/uL — ABNORMAL HIGH (ref 0.7–4.0)
MCH: 29.3 pg (ref 26.0–34.0)
MCHC: 33.3 g/dL (ref 30.0–36.0)
MCV: 87.9 fL (ref 78.0–100.0)
MONO ABS: 0.7 10*3/uL (ref 0.1–1.0)
MONOS PCT: 7 % (ref 3–12)
Neutro Abs: 4.4 10*3/uL (ref 1.7–7.7)
Neutrophils Relative %: 46 % (ref 43–77)
Platelets: 200 10*3/uL (ref 150–400)
RBC: 4.78 MIL/uL (ref 3.87–5.11)
RDW: 13.8 % (ref 11.5–15.5)
WBC: 9.7 10*3/uL (ref 4.0–10.5)

## 2014-12-08 LAB — URINALYSIS, ROUTINE W REFLEX MICROSCOPIC
GLUCOSE, UA: NEGATIVE mg/dL
Hgb urine dipstick: NEGATIVE
KETONES UR: NEGATIVE mg/dL
Nitrite: NEGATIVE
PROTEIN: NEGATIVE mg/dL
Specific Gravity, Urine: 1.023 (ref 1.005–1.030)
Urobilinogen, UA: 0.2 mg/dL (ref 0.0–1.0)
pH: 5.5 (ref 5.0–8.0)

## 2014-12-08 LAB — LIPASE, BLOOD: LIPASE: 12 U/L — AB (ref 22–51)

## 2014-12-08 LAB — URINE MICROSCOPIC-ADD ON

## 2014-12-08 MED ORDER — ONDANSETRON 4 MG PO TBDP
4.0000 mg | ORAL_TABLET | Freq: Once | ORAL | Status: AC
  Administered 2014-12-08: 4 mg via ORAL
  Filled 2014-12-08: qty 1

## 2014-12-08 NOTE — ED Notes (Signed)
Per EMS- N, V, "aches and pains" x4 days. No other c/c. Ambulatory. VSS. A&Ox4.

## 2014-12-08 NOTE — ED Provider Notes (Signed)
CSN: [id number]     Arrival date & time 12/08/14  1851 History   First MD Initiated Contact with Patient 12/08/14 2301     Chief Complaint  Patient presents with  . Nausea  . Emesis     (Consider location/radiation/quality/duration/timing/severity/associated sxs/prior Treatment) HPI Patient presents with 4 days of nausea and several episodes of vomiting after eating. She states she is able to drink without any nausea or vomiting. She denies any pain currently. She denies any fever or chills. No focal weakness or numbness. No known sick contacts. Denies any urinary symptoms. States she is hungry Past Medical History  Diagnosis Date  . Diabetes mellitus without complication   . Hypertension   . High cholesterol   . Anxiety    Past Surgical History  Procedure Laterality Date  . Vaginal polyp removal    . Left heart catheterization with coronary angiogram N/A 06/15/2013    Procedure: LEFT HEART CATHETERIZATION WITH CORONARY ANGIOGRAM;  Surgeon: Clent Demark, MD;  Location: [facility] CATH LAB;  Service: Cardiovascular;  Laterality: N/A;   History reviewed. No pertinent family history. History  Substance Use Topics  . Smoking status: Former Research scientist (life sciences)  . Smokeless tobacco: Not on file  . Alcohol Use: No   OB History    No data available     Review of Systems  Constitutional: Positive for fatigue. Negative for fever and chills.  Respiratory: Negative for shortness of breath.   Cardiovascular: Negative for chest pain.  Gastrointestinal: Negative for nausea, vomiting, abdominal pain, diarrhea, constipation and abdominal distention.  Genitourinary: Negative for dysuria, frequency, hematuria, flank pain and difficulty urinating.  Musculoskeletal: Negative for myalgias, back pain, neck pain and neck stiffness.  Skin: Negative for rash and wound.  Neurological: Negative for dizziness, weakness, light-headedness, numbness and headaches.  Psychiatric/Behavioral: Negative for confusion.  All  other systems reviewed and are negative.     Allergies  Penicillins  Home Medications   Prior to Admission medications   Medication Sig Start Date End Date Taking? Authorizing Provider  ALPRAZolam Duanne Moron) 0.5 MG tablet Take 0.5 mg by mouth 3 (three) times daily as needed for anxiety.  02/12/14  Yes Historical Provider, MD  amLODipine (NORVASC) 10 MG tablet Take 10 mg by mouth daily.   Yes Historical Provider, MD  aspirin EC 81 MG EC tablet Take 1 tablet (81 mg total) by mouth daily. 06/15/13  Yes Delfina Redwood, MD  busPIRone (BUSPAR) 7.5 MG tablet Take 7.5 mg by mouth 2 (two) times daily.   Yes Historical Provider, MD  colchicine (COLCRYS) 0.6 MG tablet Take 0.6 mg by mouth 2 (two) times daily.   Yes Historical Provider, MD  insulin glargine (LANTUS) 100 UNIT/ML injection Inject 22-30 Units into the skin 2 (two) times daily. Inject 30 units in the morning and 22 units in the evening   Yes Historical Provider, MD  losartan (COZAAR) 50 MG tablet Take 50 mg by mouth daily.   Yes Historical Provider, MD  metoprolol (LOPRESSOR) 50 MG tablet Take 50 mg by mouth 2 (two) times daily.   Yes Historical Provider, MD  oxyCODONE-acetaminophen (PERCOCET) 7.5-325 MG per tablet Take 0.5-1 tablets by mouth 2 (two) times daily as needed for pain.   Yes Historical Provider, MD  pantoprazole (PROTONIX) 40 MG tablet Take 40 mg by mouth daily.   Yes Historical Provider, MD  simvastatin (ZOCOR) 20 MG tablet Take 20 mg by mouth every evening.   Yes Historical Provider, MD  sitaGLIPtin-metformin (JANUMET) 50-500 MG  per tablet Take 1 tablet by mouth daily.   Yes Historical Provider, MD  triamterene-hydrochlorothiazide (MAXZIDE-25) 37.5-25 MG per tablet Take 1 tablet by mouth every morning.   Yes Historical Provider, MD  lactobacillus acidophilus & bulgar (LACTINEX) chewable tablet Chew 1 tablet by mouth 3 (three) times daily with meals. Patient not taking: Reported on 12/08/2014 06/10/13   Margarita Mail, PA-C   ondansetron (ZOFRAN ODT) 4 MG disintegrating tablet 4mg  ODT q4 hours prn nausea/vomit 12/09/14   Julianne Rice, MD  ondansetron (ZOFRAN) 4 MG tablet Take 1 tablet (4 mg total) by mouth every 8 (eight) hours as needed for nausea or vomiting. Patient not taking: Reported on 12/08/2014 06/10/13   Margarita Mail, PA-C  sulfamethoxazole-trimethoprim (BACTRIM DS,SEPTRA DS) 800-160 MG per tablet Take 1 tablet by mouth 2 (two) times daily. 12/09/14 12/16/14  Julianne Rice, MD   BP 151/81 mmHg  Pulse 57  Temp(Src) 98.1 F (36.7 C) (Oral)  Resp 18  SpO2 99% Physical Exam  Constitutional: She is oriented to person, place, and time. She appears well-developed and well-nourished. No distress.  HENT:  Head: Normocephalic and atraumatic.  Mouth/Throat: Oropharynx is clear and moist. No oropharyngeal exudate.  Eyes: EOM are normal. Pupils are equal, round, and reactive to light.  Neck: Normal range of motion. Neck supple.  Cardiovascular: Normal rate and regular rhythm.   Pulmonary/Chest: Effort normal and breath sounds normal. No respiratory distress. She has no wheezes. She has no rales. She exhibits no tenderness.  Abdominal: Soft. Bowel sounds are normal. She exhibits no distension and no mass. There is no tenderness. There is no rebound and no guarding.  Musculoskeletal: Normal range of motion. She exhibits no edema or tenderness.  No CVA tenderness.  Neurological: She is alert and oriented to person, place, and time.  Moves all extremities without deficit. Sensation is grossly intact.  Skin: Skin is warm and dry. No rash noted. No erythema.  Psychiatric: She has a normal mood and affect. Her behavior is normal.  Nursing note and vitals reviewed.   ED Course  Procedures (including critical care time) Labs Review Labs Reviewed  COMPREHENSIVE METABOLIC PANEL - Abnormal; Notable for the following:    Creatinine, Ser 1.02 (*)    GFR calc non Af Amer 50 (*)    GFR calc Af Amer 58 (*)    All other  components within normal limits  CBC WITH DIFFERENTIAL/PLATELET - Abnormal; Notable for the following:    Lymphs Abs 4.4 (*)    All other components within normal limits  LIPASE, BLOOD - Abnormal; Notable for the following:    Lipase 12 (*)    All other components within normal limits  URINALYSIS, ROUTINE W REFLEX MICROSCOPIC (NOT AT [facility]) - Abnormal; Notable for the following:    APPearance CLOUDY (*)    Bilirubin Urine SMALL (*)    Leukocytes, UA MODERATE (*)    All other components within normal limits  URINE MICROSCOPIC-ADD ON - Abnormal; Notable for the following:    Squamous Epithelial / LPF FEW (*)    Casts HYALINE CASTS (*)    All other components within normal limits    Imaging Review No results found.   EKG Interpretation None      MDM   Final diagnoses:  UTI (lower urinary tract infection)  Non-intractable vomiting with nausea, vomiting of unspecified type   Patient with no vomiting in the emergency department. Is able to eat a sandwich. Likely UTI. Will start on antibiotics and give  antiemetics. Advised to follow-up with her primary physician. Return precautions have been given.     Julianne Rice, MD 12/09/14 [phone]

## 2014-12-08 NOTE — ED Notes (Signed)
Patient called back x1

## 2014-12-09 DIAGNOSIS — N390 Urinary tract infection, site not specified: Secondary | ICD-10-CM | POA: Diagnosis not present

## 2014-12-09 MED ORDER — SULFAMETHOXAZOLE-TRIMETHOPRIM 800-160 MG PO TABS: 1.0000 | ORAL_TABLET | Freq: Two times a day (BID) | ORAL | Status: AC

## 2014-12-09 MED ORDER — SULFAMETHOXAZOLE-TRIMETHOPRIM 800-160 MG PO TABS
1.0000 | ORAL_TABLET | Freq: Once | ORAL | Status: AC
  Administered 2014-12-09: 1 via ORAL
  Filled 2014-12-09: qty 1

## 2014-12-09 MED ORDER — ONDANSETRON 4 MG PO TBDP: ORAL_TABLET | ORAL | Status: DC

## 2014-12-09 NOTE — Discharge Instructions (Signed)
Nausea and Vomiting Nausea is a sick feeling that often comes before throwing up (vomiting). Vomiting is a reflex where stomach contents come out of your mouth. Vomiting can cause severe loss of body fluids (dehydration). Children and elderly adults can become dehydrated quickly, especially if they also have diarrhea. Nausea and vomiting are symptoms of a condition or disease. It is important to find the cause of your symptoms. CAUSES   Direct irritation of the stomach lining. This irritation can result from increased acid production (gastroesophageal reflux disease), infection, food poisoning, taking certain medicines (such as nonsteroidal anti-inflammatory drugs), alcohol use, or tobacco use.  Signals from the brain.These signals could be caused by a headache, heat exposure, an inner ear disturbance, increased pressure in the brain from injury, infection, a tumor, or a concussion, pain, emotional stimulus, or metabolic problems.  An obstruction in the gastrointestinal tract (bowel obstruction).  Illnesses such as diabetes, hepatitis, gallbladder problems, appendicitis, kidney problems, cancer, sepsis, atypical symptoms of a heart attack, or eating disorders.  Medical treatments such as chemotherapy and radiation.  Receiving medicine that makes you sleep (general anesthetic) during surgery. DIAGNOSIS Your caregiver may ask for tests to be done if the problems do not improve after a few days. Tests may also be done if symptoms are severe or if the reason for the nausea and vomiting is not clear. Tests may include:  Urine tests.  Blood tests.  Stool tests.  Cultures (to look for evidence of infection).  X-rays or other imaging studies. Test results can help your caregiver make decisions about treatment or the need for additional tests. TREATMENT You need to stay well hydrated. Drink frequently but in small amounts.You may wish to drink water, sports drinks, clear broth, or eat frozen  ice pops or gelatin dessert to help stay hydrated.When you eat, eating slowly may help prevent nausea.There are also some antinausea medicines that may help prevent nausea. HOME CARE INSTRUCTIONS   Take all medicine as directed by your caregiver.  If you do not have an appetite, do not force yourself to eat. However, you must continue to drink fluids.  If you have an appetite, eat a normal diet unless your caregiver tells you differently.  Eat a variety of complex carbohydrates (rice, wheat, potatoes, bread), lean meats, yogurt, fruits, and vegetables.  Avoid high-fat foods because they are more difficult to digest.  Drink enough water and fluids to keep your urine clear or pale yellow.  If you are dehydrated, ask your caregiver for specific rehydration instructions. Signs of dehydration may include:  Severe thirst.  Dry lips and mouth.  Dizziness.  Dark urine.  Decreasing urine frequency and amount.  Confusion.  Rapid breathing or pulse. SEEK IMMEDIATE MEDICAL CARE IF:   You have blood or brown flecks (like coffee grounds) in your vomit.  You have black or bloody stools.  You have a severe headache or stiff neck.  You are confused.  You have severe abdominal pain.  You have chest pain or trouble breathing.  You do not urinate at least once every 8 hours.  You develop cold or clammy skin.  You continue to vomit for longer than 24 to 48 hours.  You have a fever. MAKE SURE YOU:   Understand these instructions.  Will watch your condition.  Will get help right away if you are not doing well or get worse. Document Released: 05/21/2005 Document Revised: 08/13/2011 Document Reviewed: 10/18/2010 [employer] Patient Information 2015 [employer]. This information is not intended   to replace advice given to you by your health care provider. Make sure you discuss any questions you have with your health care provider.  Urinary Tract Infection Urinary tract  infections (UTIs) can develop anywhere along your urinary tract. Your urinary tract is your body's drainage system for removing wastes and extra water. Your urinary tract includes two kidneys, two ureters, a bladder, and a urethra. Your kidneys are a pair of bean-shaped organs. Each kidney is about the size of your fist. They are located below your ribs, one on each side of your spine. CAUSES Infections are caused by microbes, which are microscopic organisms, including fungi, viruses, and bacteria. These organisms are so small that they can only be seen through a microscope. Bacteria are the microbes that most commonly cause UTIs. SYMPTOMS  Symptoms of UTIs may vary by age and gender of the patient and by the location of the infection. Symptoms in young women typically include a frequent and intense urge to urinate and a painful, burning feeling in the bladder or urethra during urination. Older women and men are more likely to be tired, shaky, and weak and have muscle aches and abdominal pain. A fever may mean the infection is in your kidneys. Other symptoms of a kidney infection include pain in your back or sides below the ribs, nausea, and vomiting. DIAGNOSIS To diagnose a UTI, your caregiver will ask you about your symptoms. Your caregiver also will ask to provide a urine sample. The urine sample will be tested for bacteria and white blood cells. White blood cells are made by your body to help fight infection. TREATMENT  Typically, UTIs can be treated with medication. Because most UTIs are caused by a bacterial infection, they usually can be treated with the use of antibiotics. The choice of antibiotic and length of treatment depend on your symptoms and the type of bacteria causing your infection. HOME CARE INSTRUCTIONS  If you were prescribed antibiotics, take them exactly as your caregiver instructs you. Finish the medication even if you feel better after you have only taken some of the  medication.  Drink enough water and fluids to keep your urine clear or pale yellow.  Avoid caffeine, tea, and carbonated beverages. They tend to irritate your bladder.  Empty your bladder often. Avoid holding urine for long periods of time.  Empty your bladder before and after sexual intercourse.  After a bowel movement, women should cleanse from front to back. Use each tissue only once. SEEK MEDICAL CARE IF:   You have back pain.  You develop a fever.  Your symptoms do not begin to resolve within 3 days. SEEK IMMEDIATE MEDICAL CARE IF:   You have severe back pain or lower abdominal pain.  You develop chills.  You have nausea or vomiting.  You have continued burning or discomfort with urination. MAKE SURE YOU:   Understand these instructions.  Will watch your condition.  Will get help right away if you are not doing well or get worse. Document Released: 02/28/2005 Document Revised: 11/20/2011 Document Reviewed: 06/29/2011 [facility] Patient Information 2015 [place], [place]. This information is not intended to replace advice given to you by your health care provider. Make sure you discuss any questions you have with your health care provider.

## 2015-03-08 IMAGING — US US ABDOMEN COMPLETE
1 series · 13 of 25 positions shown · non-contrast
Comparison: CT abdomen and pelvis 06/02/2013

CLINICAL DATA: Right upper quadrant pain, UTI, history diabetes,
hypertension

EXAM:
ULTRASOUND ABDOMEN COMPLETE

[Series 1: us abdomen complete · 0.18mm/px · 13 of 70 slices shown]
[im 1/70]
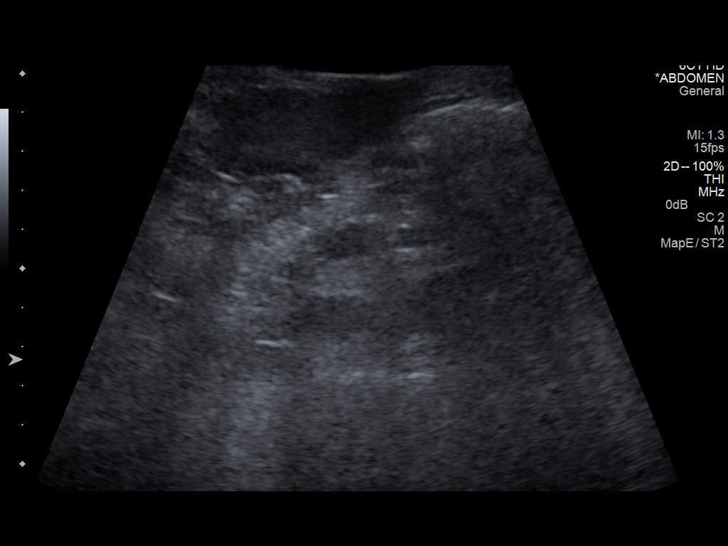
[im 6/70]
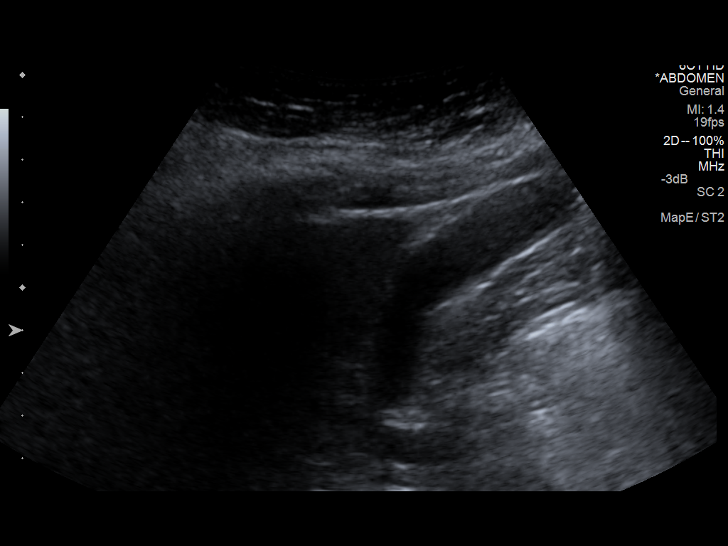
[im 12/70]
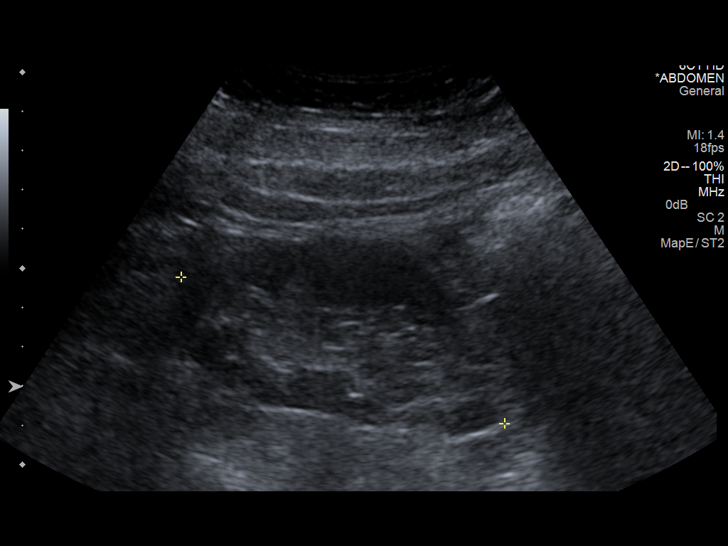
[im 18/70]
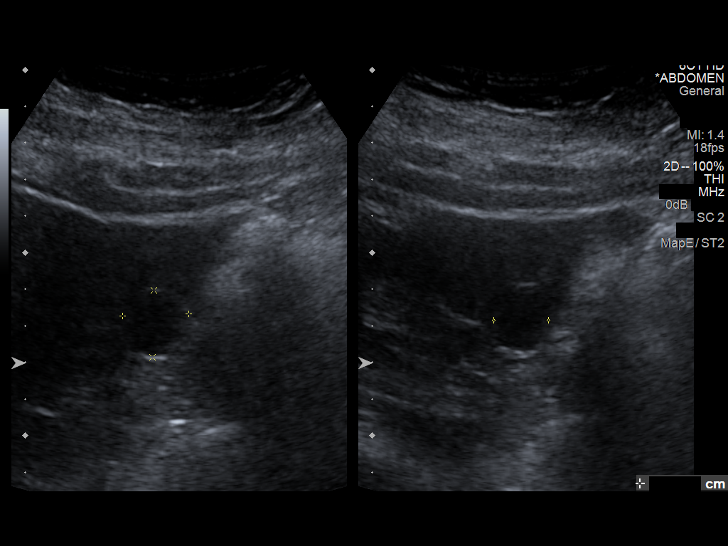
[im 24/70]
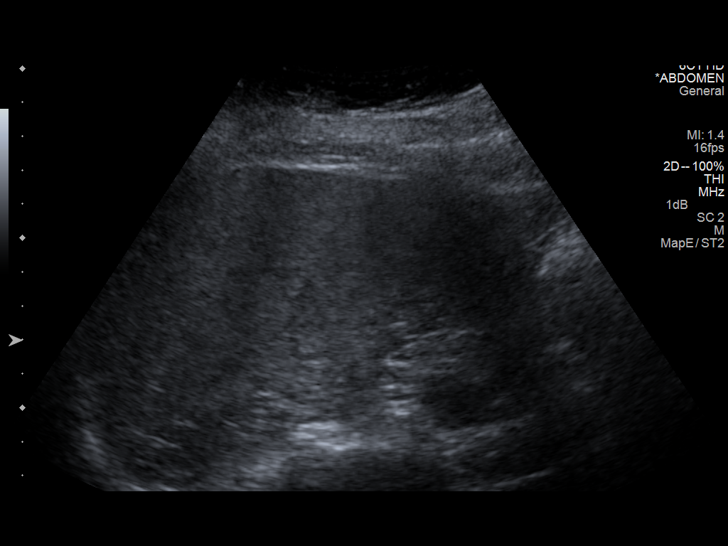
[im 29/70]
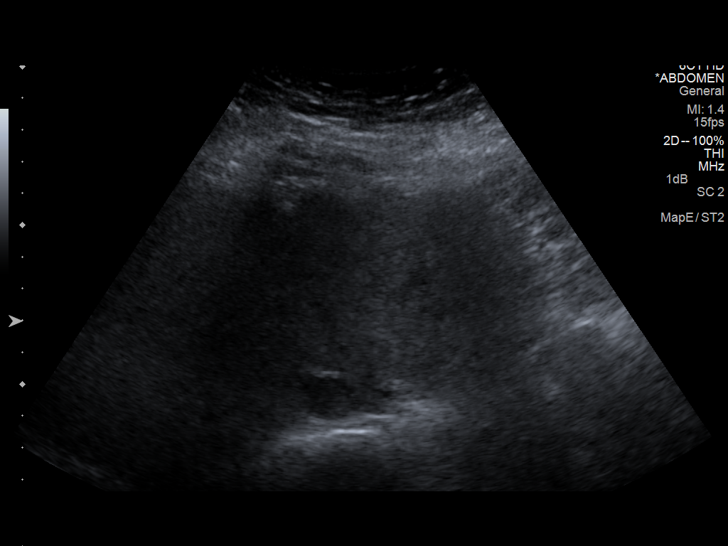
[im 35/70]
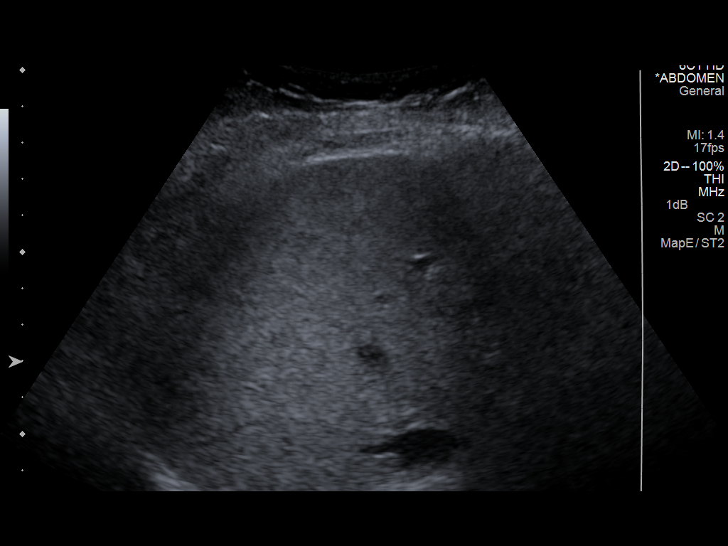
[im 41/70]
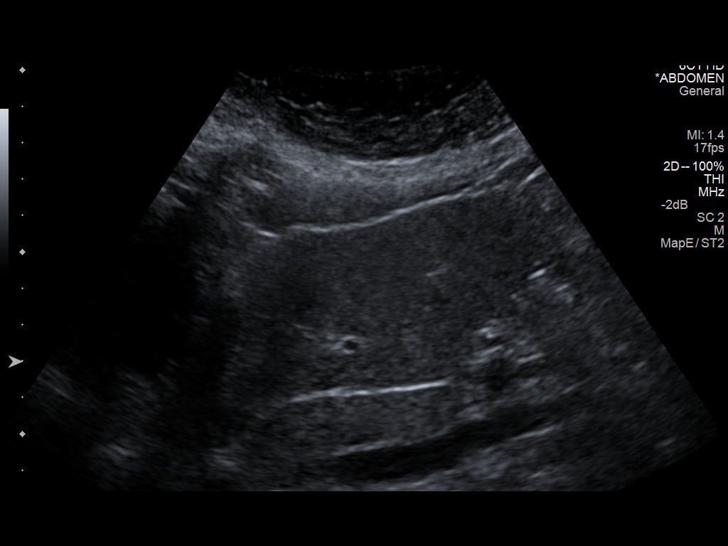
[im 47/70]
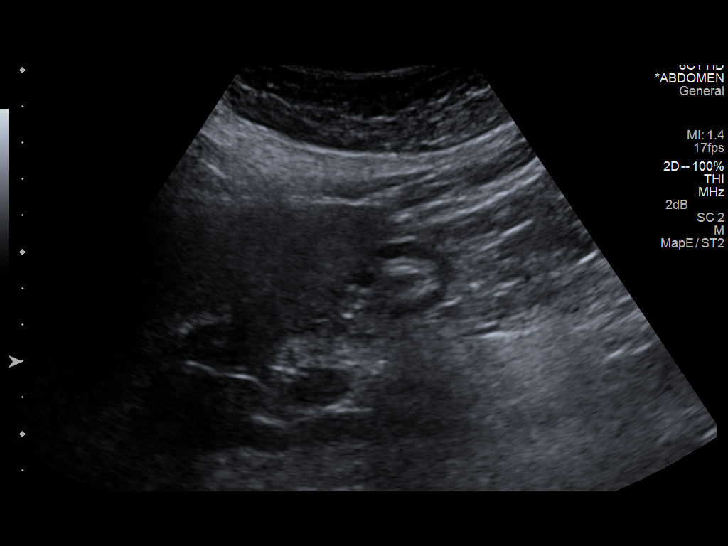
[im 52/70]
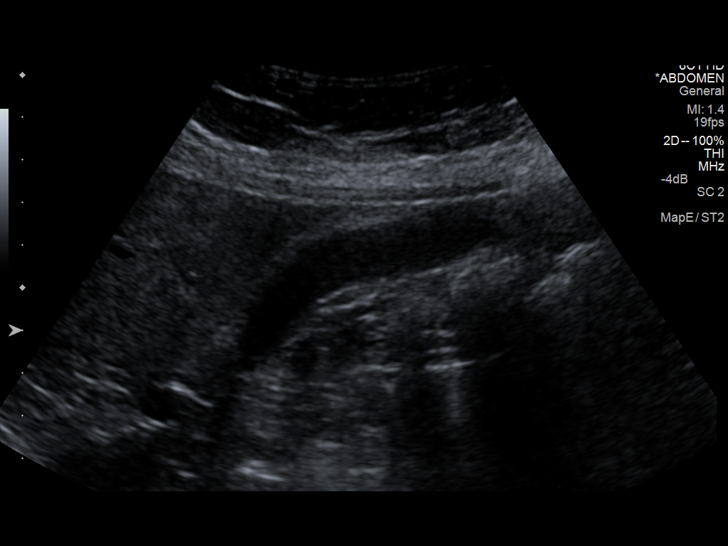
[im 58/70]
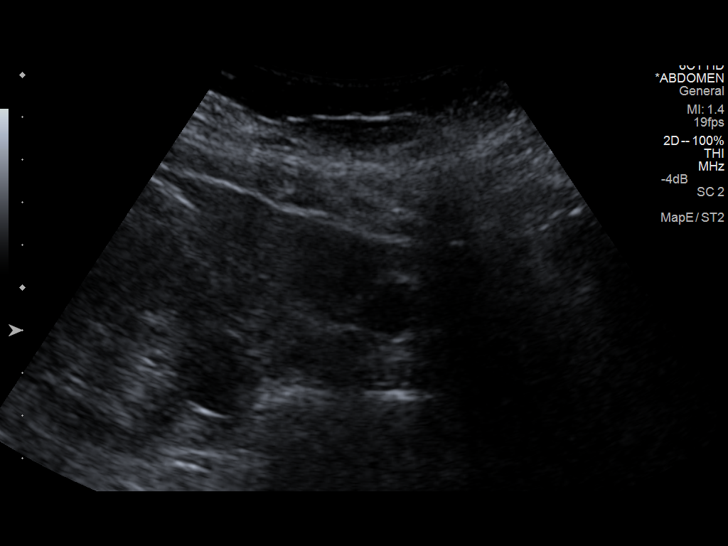
[im 64/70]
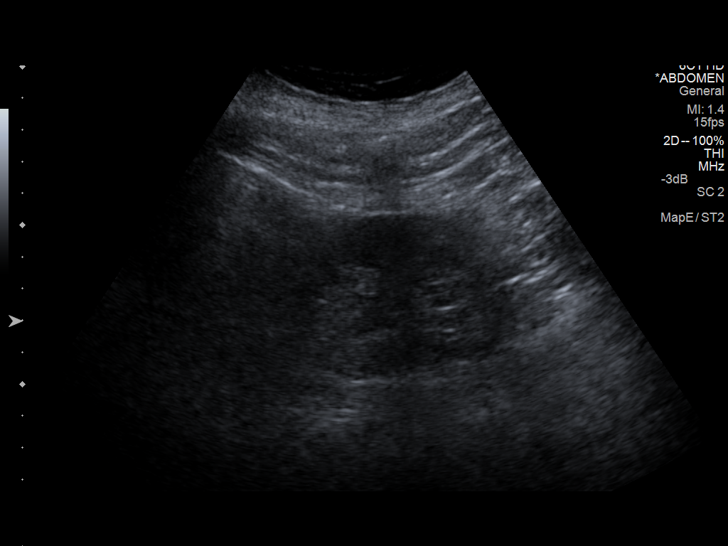
[im 70/70]
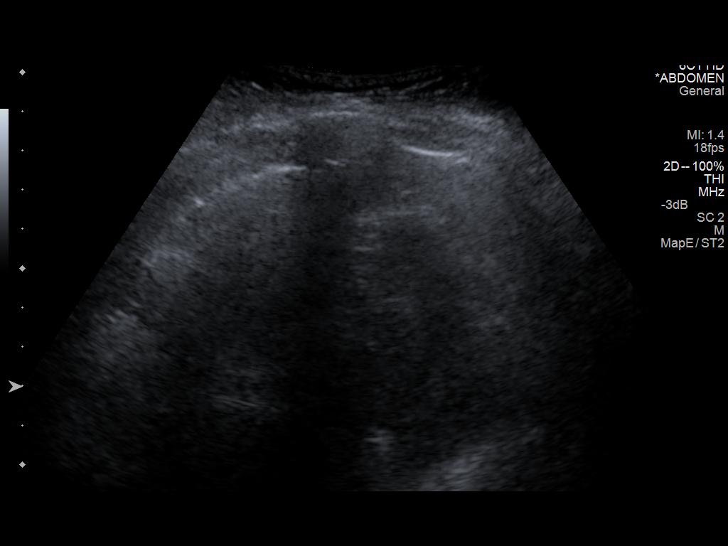

[13 of 25 positions shown; findings below may reference images not displayed]

FINDINGS: Gallbladder:

Normally distended without stones or wall thickening.

No pericholecystic fluid or sonographic Murphy sign.

Common bile duct:

Diameter: Normal caliber 4 mm diameter

Liver:

Inhomogeneous mildly increased echogenicity, question fatty
infiltration of this can be seen with cirrhosis uncertain
infiltrative disorders. No focal mass lesion.

IVC:

Normal appearance

Pancreas:

Inadequately visualized due to bowel gas

Spleen:

Normal appearance, 2.7 cm length

Right Kidney:

Length: 9.0 cm. Suboptimally visualized due to body habitus. Grossly
normal cortical thickness and echogenicity. Small cyst medially 18 x
18 x 15 mm, seen on prior CT. No additional mass or hydronephrosis.

Left Kidney:

Length: 8.4 cm. Appear small and atrophic with cortical thinning. No
gross mass or hydronephrosis.

Abdominal aorta:

Normal caliber

Other findings:

No free-fluid
IMPRESSION: Probable fatty infiltration of liver.

Inadequate pancreatic visualization.

Small right renal cyst with small kidneys noted bilaterally.

No acute abnormalities.

## 2015-03-08 IMAGING — CR DG CHEST 2V
2 series · 2 of 2 positions shown · non-contrast
Comparison: 05/04/2013

CLINICAL DATA: Right lower chest pain extending to shoulder,

EXAM:
CHEST  2 VIEW

[w chest pa]
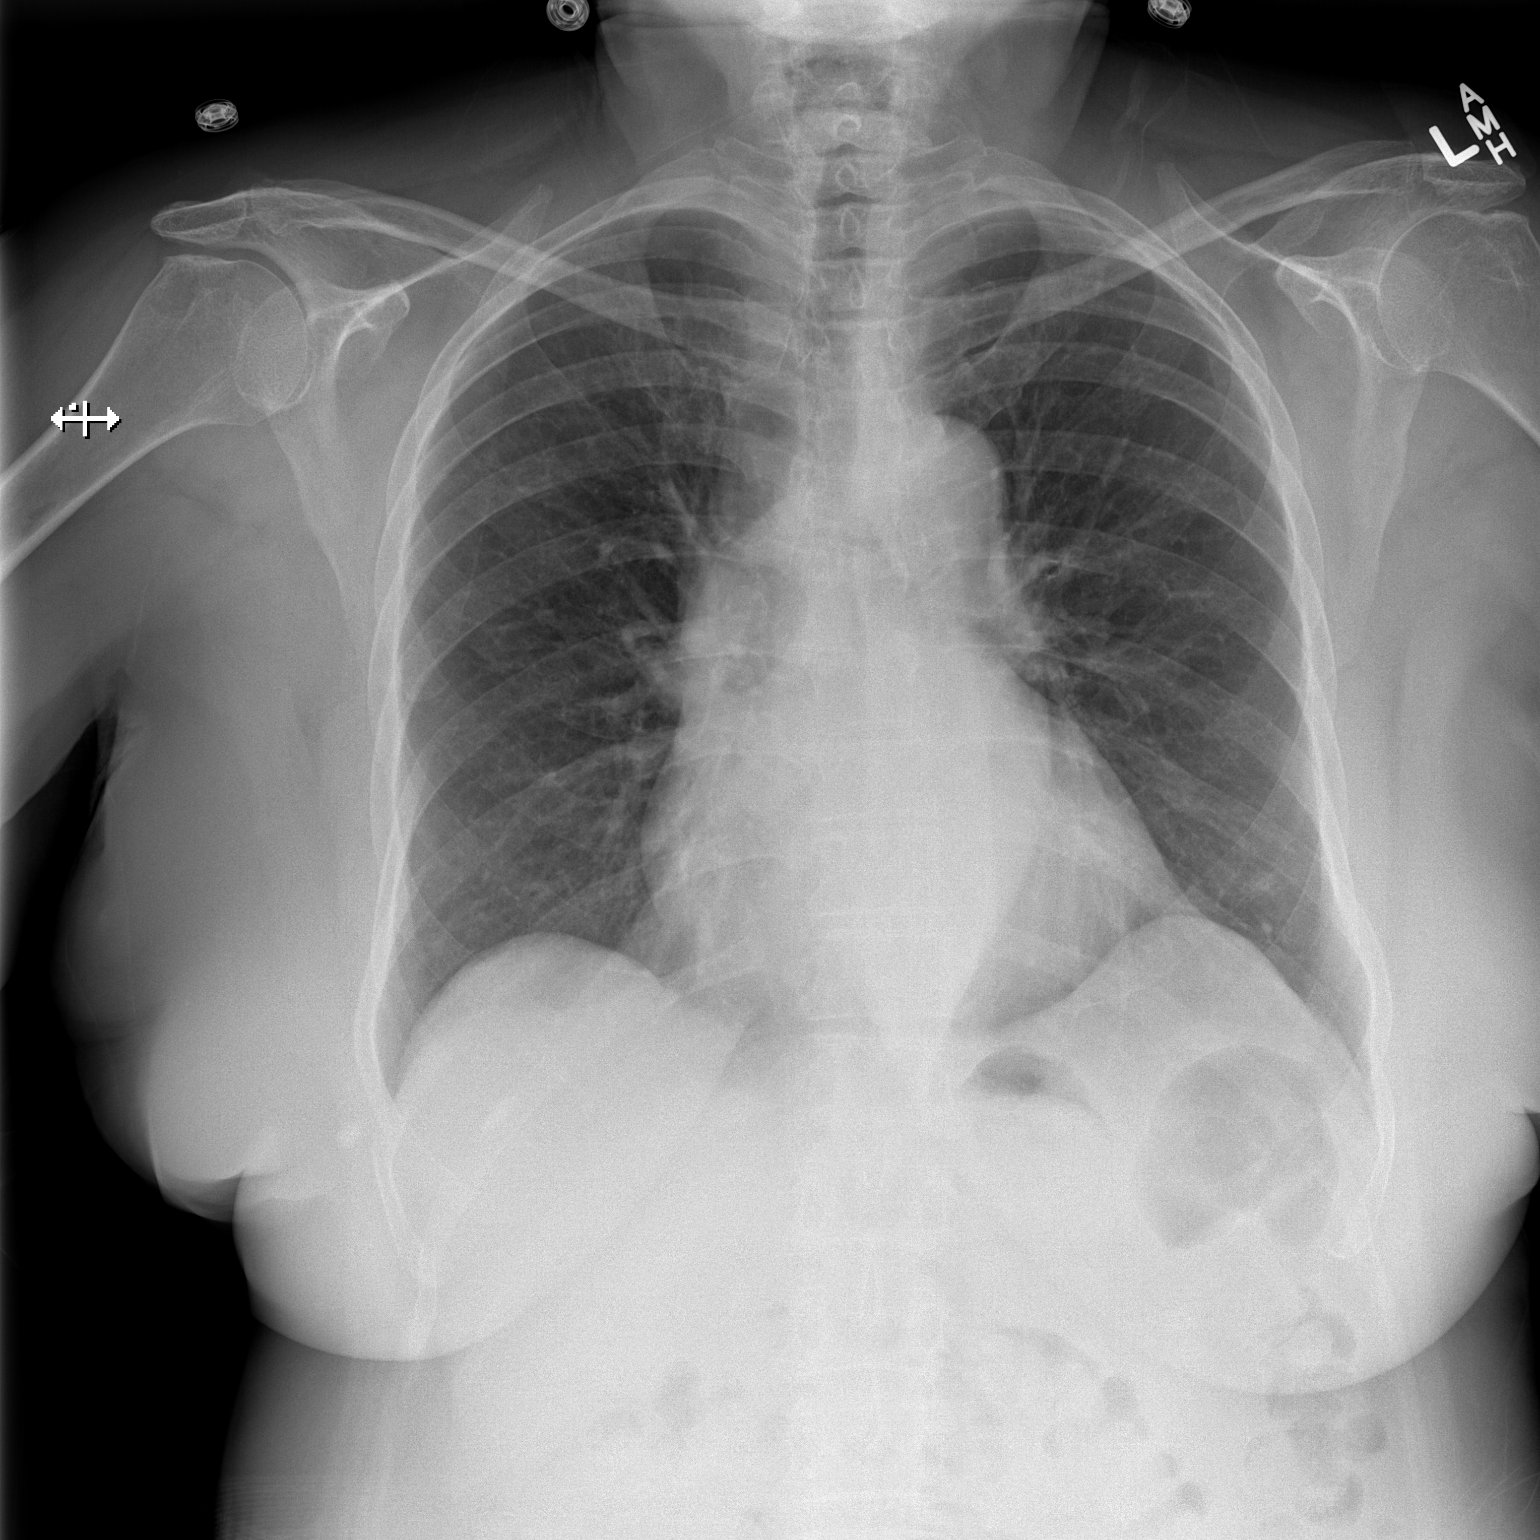

[w chest lat]
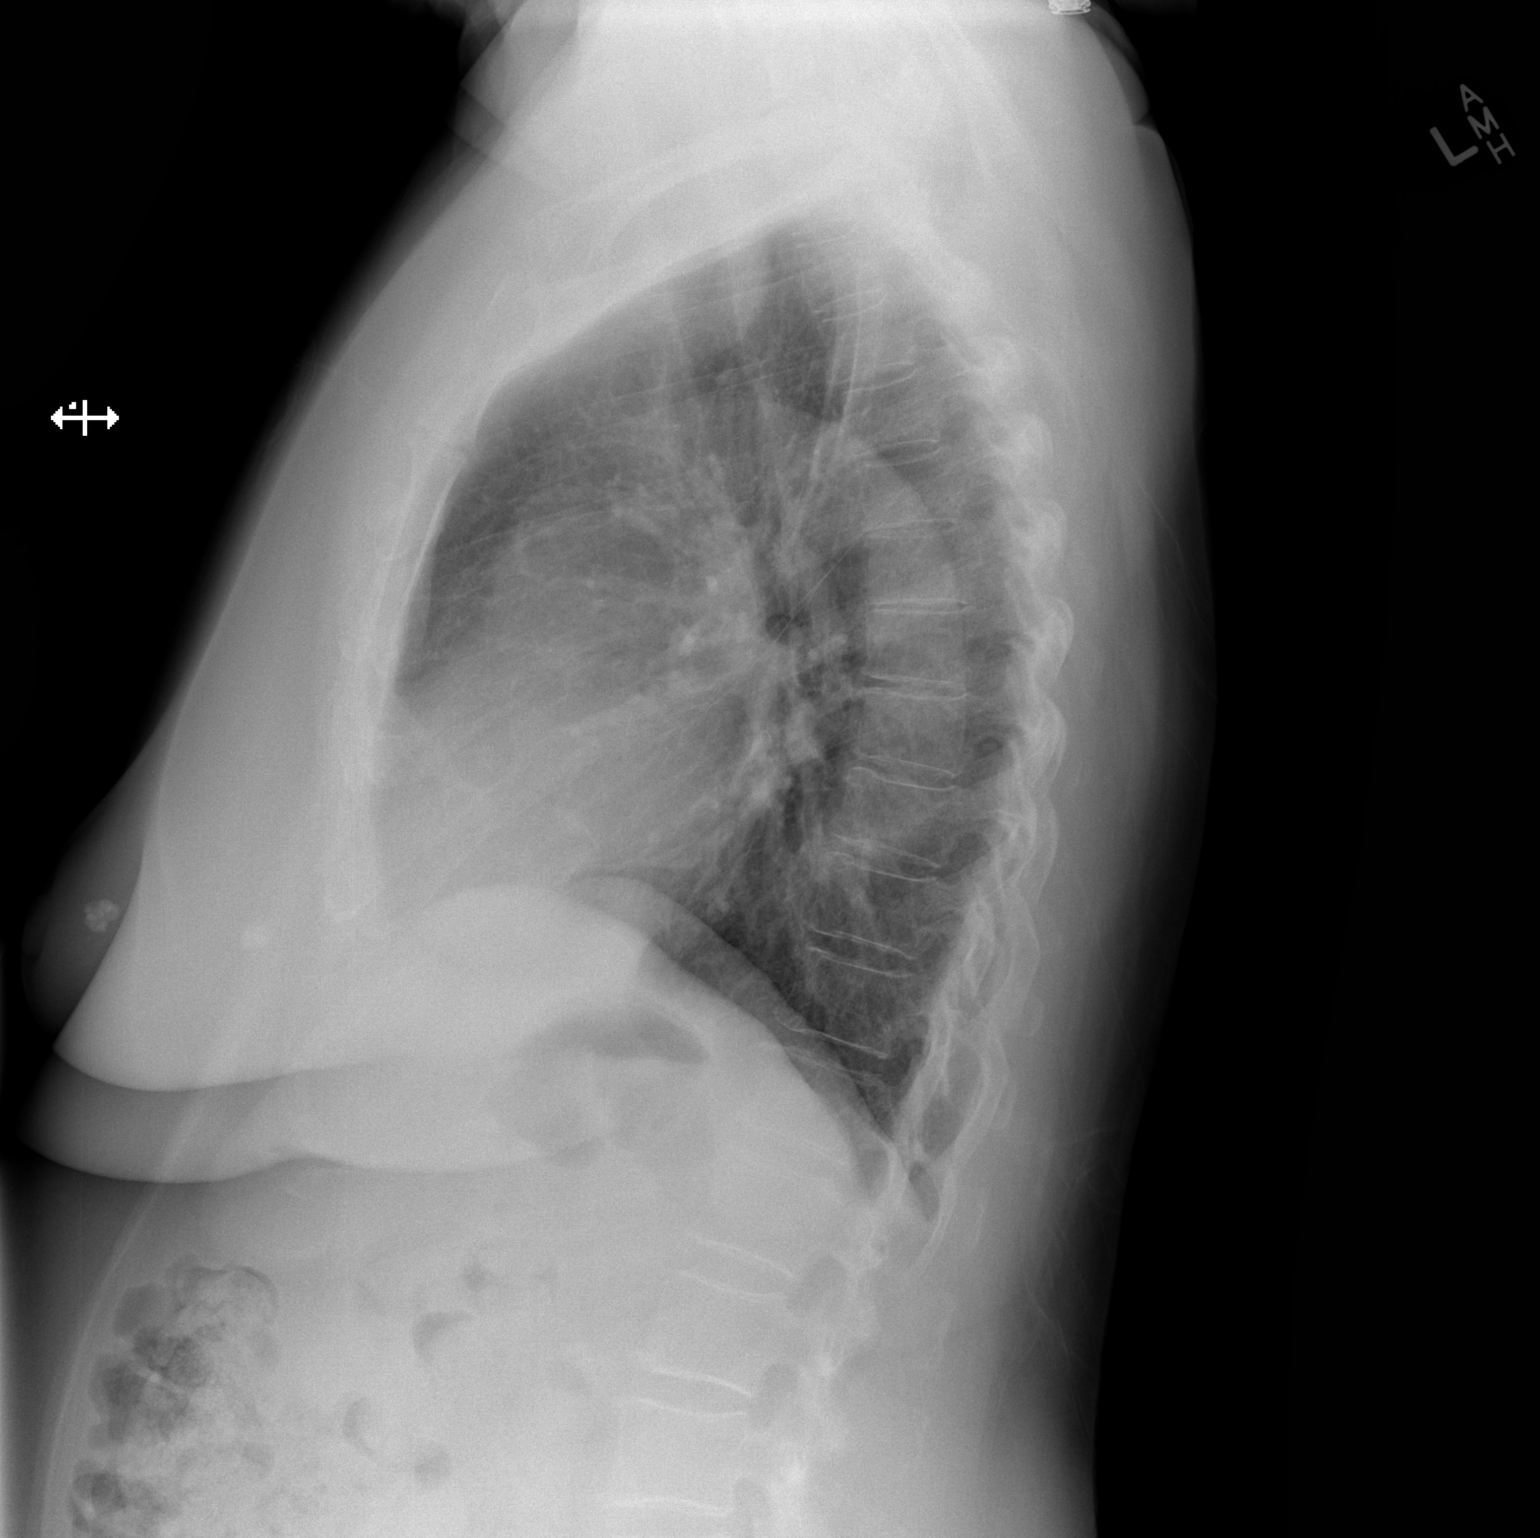

[2 of 2 positions shown; findings below may reference images not displayed]

FINDINGS: Minimal enlargement of cardiac silhouette.

Tortuous aorta.

Pulmonary vascularity normal.

Lungs clear.

No pleural effusion or pneumothorax.

Tiny chronic nodular opacity at the right lung base appears
unchanged since 03/15/2009.

Bones demineralized.
IMPRESSION: Minimal enlargement of cardiac silhouette.

No acute abnormalities.

## 2015-09-19 ENCOUNTER — Encounter (HOSPITAL_COMMUNITY): Payer: Self-pay

## 2015-09-19 ENCOUNTER — Emergency Department (HOSPITAL_BASED_OUTPATIENT_CLINIC_OR_DEPARTMENT_OTHER)
Admit: 2015-09-19 | Discharge: 2015-09-19 | Disposition: A | Discharge status: Home / Self Care | Payer: Medicare Other | Attending: Emergency Medicine | Admitting: Emergency Medicine

## 2015-09-19 ENCOUNTER — Emergency Department (HOSPITAL_COMMUNITY): Payer: Medicare Other

## 2015-09-19 ENCOUNTER — Emergency Department (HOSPITAL_COMMUNITY)
Admission: EM | Admit: 2015-09-19 | Discharge: 2015-09-19 | Disposition: A | Discharge status: Home / Self Care | Payer: Medicare Other | Attending: Emergency Medicine | Admitting: Emergency Medicine

## 2015-09-19 DIAGNOSIS — E7800 Pure hypercholesterolemia, unspecified: Secondary | ICD-10-CM | POA: Insufficient documentation

## 2015-09-19 DIAGNOSIS — M79609 Pain in unspecified limb: Secondary | ICD-10-CM

## 2015-09-19 DIAGNOSIS — M7989 Other specified soft tissue disorders: Secondary | ICD-10-CM | POA: Diagnosis not present

## 2015-09-19 DIAGNOSIS — E119 Type 2 diabetes mellitus without complications: Secondary | ICD-10-CM | POA: Insufficient documentation

## 2015-09-19 DIAGNOSIS — Z7982 Long term (current) use of aspirin: Secondary | ICD-10-CM | POA: Diagnosis not present

## 2015-09-19 DIAGNOSIS — M79605 Pain in left leg: Secondary | ICD-10-CM

## 2015-09-19 DIAGNOSIS — Z7984 Long term (current) use of oral hypoglycemic drugs: Secondary | ICD-10-CM | POA: Insufficient documentation

## 2015-09-19 DIAGNOSIS — Z87891 Personal history of nicotine dependence: Secondary | ICD-10-CM | POA: Insufficient documentation

## 2015-09-19 DIAGNOSIS — F419 Anxiety disorder, unspecified: Secondary | ICD-10-CM | POA: Insufficient documentation

## 2015-09-19 DIAGNOSIS — Z79899 Other long term (current) drug therapy: Secondary | ICD-10-CM | POA: Insufficient documentation

## 2015-09-19 DIAGNOSIS — M1712 Unilateral primary osteoarthritis, left knee: Secondary | ICD-10-CM

## 2015-09-19 DIAGNOSIS — Z794 Long term (current) use of insulin: Secondary | ICD-10-CM | POA: Insufficient documentation

## 2015-09-19 DIAGNOSIS — Z880 Allergy status to penicillin: Secondary | ICD-10-CM | POA: Insufficient documentation

## 2015-09-19 DIAGNOSIS — M79662 Pain in left lower leg: Secondary | ICD-10-CM | POA: Diagnosis not present

## 2015-09-19 DIAGNOSIS — R739 Hyperglycemia, unspecified: Secondary | ICD-10-CM

## 2015-09-19 DIAGNOSIS — Z9889 Other specified postprocedural states: Secondary | ICD-10-CM | POA: Insufficient documentation

## 2015-09-19 DIAGNOSIS — I10 Essential (primary) hypertension: Secondary | ICD-10-CM | POA: Insufficient documentation

## 2015-09-19 LAB — CBG MONITORING, ED
GLUCOSE-CAPILLARY: 385 mg/dL — AB (ref 65–99)
Glucose-Capillary: 309 mg/dL — ABNORMAL HIGH (ref 65–99)

## 2015-09-19 MED ORDER — INSULIN ASPART 100 UNIT/ML ~~LOC~~ SOLN
5.0000 [IU] | Freq: Once | SUBCUTANEOUS | Status: AC
  Administered 2015-09-19: 5 [IU] via SUBCUTANEOUS
  Filled 2015-09-19: qty 1

## 2015-09-19 MED ORDER — HYDROCODONE-ACETAMINOPHEN 5-325 MG PO TABS
1.0000 | ORAL_TABLET | Freq: Once | ORAL | Status: AC
  Administered 2015-09-19: 1 via ORAL
  Filled 2015-09-19: qty 1

## 2015-09-19 NOTE — Discharge Instructions (Signed)

## 2015-09-19 NOTE — ED Provider Notes (Signed)
4 PM: Care assumed from [facility], PA-C at shift change.  DVT study negative.  Plain films remarkable for OA.  Originally, CBG 309, patient given 5U insulin.  Upon recheck, CBG 385.  Patient states she has not eaten or drank anything besides water during ED stay. Patient with known DM and has not taken her medications today.  Discussed importance of medication compliance for adequate management of DM.  No signs or symptoms of DKA. Patient has medications at home, I instructed her to take them this evening as regularly scheduled. Patient has Percocet at home for pain control.  Patient has PCP appointment in the next 2 days.  Evaluation does not show pathology requiring ongoing emergent intervention or admission. Pt is hemodynamically stable and mentating appropriately. Discussed findings/results and plan with patient/guardian, who agrees with plan. All questions answered. Return precautions discussed and outpatient follow up given.      Gloriann Loan, PA-C 09/19/15 [place], DO 09/19/15 [phone]

## 2015-09-19 NOTE — ED Notes (Signed)
PT RECEIVED VIA EMS FROM HOME C/O LEFT LEG SWELLING X3 DAYS. PT DENIES INJURY, STATING SHE HAS BEEN STANDING ON HER FEET COOKING FOR THE LAST 3 DAYS. PT STATES SHE INJURED HER LEFT KNEE YEARS AGO, AND HAS SWELLING TO THAT LEG ON AND OFF SINCE.

## 2015-09-19 NOTE — Progress Notes (Signed)
*  Preliminary Results* Left lower extremity venous duplex completed. Left lower extremity is negative for deep vein thrombosis. There is no evidence of left Baker's cyst.  09/19/2015 4:02 PM  Maudry Mayhew, RVT, RDCS, RDMS

## 2015-09-19 NOTE — ED Provider Notes (Signed)
CSN: [id number]     Arrival date & time 09/19/15  1219 History   First MD Initiated Contact with Patient 09/19/15 1255     Chief Complaint  Patient presents with  . Leg Swelling    LEFT X3 DAYS   HPI  Rhonda Conrad is an 82 year old female with a past medical history of hypertension, diabetes and anxiety presenting with left leg swelling and pain. Onset of symptoms was 3 days ago. She states that she is having pain behind her left knee. The pain is exacerbated by flexion and extension of the knee and weightbearing. She is also complaining of tenderness of the posterior calf. She describes her pains as aching. She also notes swelling of the left lower extremity. She states that her left calf and knee appear more swollen than usual. She does note a remote history of left knee injury with intermittent swelling since. She denies recent injuries or trauma to the knee. She has not tried any medications prior to arrival. Resting somewhat alleviates her pain. She denies a history of blood clots, exogenous hormones, recent travel or recent immobility. At baseline, she walks with a cane. She states that she has been cooking more frequently and on her feet over the last 3 days for the holidays. She denies cough, chest pain or shortness of breath. She has no other complaints today.  Past Medical History  Diagnosis Date  . Diabetes mellitus without complication ([place])   . Hypertension   . High cholesterol   . Anxiety    Past Surgical History  Procedure Laterality Date  . Vaginal polyp removal    . Left heart catheterization with coronary angiogram N/A 06/15/2013    Procedure: LEFT HEART CATHETERIZATION WITH CORONARY ANGIOGRAM;  Surgeon: Clent Demark, MD;  Location: [facility] CATH LAB;  Service: Cardiovascular;  Laterality: N/A;   History reviewed. No pertinent family history. Social History  Substance Use Topics  . Smoking status: Former Research scientist (life sciences)  . Smokeless tobacco: None  . Alcohol Use: No   OB History     No data available     Review of Systems  All other systems reviewed and are negative.     Allergies  Penicillins  Home Medications   Prior to Admission medications   Medication Sig Start Date End Date Taking? Authorizing Provider  ALPRAZolam Duanne Moron) 0.5 MG tablet Take 0.5 mg by mouth 3 (three) times daily as needed for anxiety.  02/12/14  Yes Historical Provider, MD  amLODipine (NORVASC) 10 MG tablet Take 10 mg by mouth daily.   Yes Historical Provider, MD  aspirin EC 81 MG EC tablet Take 1 tablet (81 mg total) by mouth daily. 06/15/13  Yes Delfina Redwood, MD  busPIRone (BUSPAR) 7.5 MG tablet Take 7.5 mg by mouth 2 (two) times daily.   Yes Historical Provider, MD  colchicine (COLCRYS) 0.6 MG tablet Take 0.6 mg by mouth 2 (two) times daily.   Yes Historical Provider, MD  insulin glargine (LANTUS) 100 UNIT/ML injection Inject 22-30 Units into the skin 2 (two) times daily. Inject 30 units in the morning and 22 units in the evening   Yes Historical Provider, MD  losartan (COZAAR) 50 MG tablet Take 50 mg by mouth daily.   Yes Historical Provider, MD  metoprolol (LOPRESSOR) 50 MG tablet Take 50 mg by mouth 2 (two) times daily.   Yes Historical Provider, MD  oxyCODONE-acetaminophen (PERCOCET) 7.5-325 MG per tablet Take 0.5-1 tablets by mouth 2 (two) times daily as needed for  pain.   Yes Historical Provider, MD  pantoprazole (PROTONIX) 40 MG tablet Take 40 mg by mouth daily.   Yes Historical Provider, MD  simvastatin (ZOCOR) 20 MG tablet Take 20 mg by mouth every evening.   Yes Historical Provider, MD  sitaGLIPtin-metformin (JANUMET) 50-500 MG per tablet Take 1 tablet by mouth daily.   Yes Historical Provider, MD  triamterene-hydrochlorothiazide (MAXZIDE-25) 37.5-25 MG per tablet Take 1 tablet by mouth every morning.   Yes Historical Provider, MD   BP 150/69 mmHg  Pulse 76  Temp(Src) 98.4 F (36.9 C) (Oral)  Resp 16  Ht 5\' 2"  (1.575 m)  Wt 81.647 kg  BMI 32.91 kg/m2  SpO2  99% Physical Exam  Constitutional: She appears well-developed and well-nourished. No distress.  HENT:  Head: Normocephalic and atraumatic.  Right Ear: External ear normal.  Left Ear: External ear normal.  Eyes: Conjunctivae are normal. Right eye exhibits no discharge. Left eye exhibits no discharge. No scleral icterus.  Neck: Normal range of motion.  Cardiovascular: Normal rate and intact distal pulses.   Pedal pulse palpable  Pulmonary/Chest: Effort normal and breath sounds normal. No respiratory distress.  Musculoskeletal: Normal range of motion.  Tenderness to palpation of the posterior calf and popliteal fossa of the left leg. Left knee appears mildly more swollen than the right. Restricted range of motion at the knee secondary to pain. Full range of motion of the left ankle and toes intact. Calf is not erythematous, warm or with palpable cords. No erythema or warmth of the left knee. No appreciable effusion.  Neurological: She is alert. Coordination normal.  5/5 ankle strength bilaterally. Deferred strength testing secondary to pain. Sensation to light touch intact throughout.  Skin: Skin is warm and dry.  Psychiatric: She has a normal mood and affect. Her behavior is normal.  Nursing note and vitals reviewed.   ED Course  Procedures (including critical care time) Labs Review Labs Reviewed  CBG MONITORING, ED - Abnormal; Notable for the following:    Glucose-Capillary 309 (*)    All other components within normal limits    Imaging Review Dg Knee Complete 4 Views Left  09/19/2015  CLINICAL DATA:  New swelling of the left knee. EXAM: LEFT KNEE - COMPLETE 4+ VIEW COMPARISON:  05/25/2005 FINDINGS: There is progressive osteoarthritis of the patellofemoral compartment and to a lesser degree of the medial and lateral compartments. Small marginal osteophytes in all 3 compartments. No joint effusion. No fracture or dislocation. 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 Electronically Signed   By: Lorriane Shire M.D.   On: 09/19/2015 15:13   I have personally reviewed and evaluated these images and lab results as part of my medical decision-making.   EKG Interpretation None      MDM   Final diagnoses:  Left leg pain   82 year old female presenting with left leg pain and swelling 3 days. Patient notes she has been more active over the past 3 days. Afebrile and nontoxic appearing. Left lower extremity is neurovascularly intact. Mild swelling noted at the left knee. Mild tenderness in the left posterior calf. No erythema, warmth or palpable cords of the calf. No appreciable effusion, warmth or erythema of the knee. No indication there is a septic joint. Patient requesting glucose testing as she is worried her blood sugar make it less and she has not had lunch. Blood glucose 309. Given 5 units insulin. Knee x-ray shows tricompartmental osteoarthritis. DVT study negative.  Presentation consistent with acute worsening of OA. Pt has oxycodone at home so will not discharge with further pain medicine. Discussed other conservative measures for OA including rest, elevation, heat and NSAIDs. Pt is to follow up with her PCP or orthopedics if pain does not improve. Pt is stable for discharge after blood glucose recheck. Oncoming provider, Gloriann Loan, PA-C, will discharge once glucose < 300.    Lahoma Crocker Diksha Tagliaferro, PA-C 09/19/15 1600  [place], PA-C 09/19/15 [place], DO 09/19/15 1742

## 2015-09-19 NOTE — ED Notes (Signed)
Bed: [id number] Expected date:  Expected time:  Means of arrival:  Comments: 82 yo L leg swelling

## 2015-09-19 NOTE — ED Notes (Signed)
PA at bedside.

## 2015-12-05 ENCOUNTER — Emergency Department (HOSPITAL_COMMUNITY)
Admission: EM | Admit: 2015-12-05 | Discharge: 2015-12-05 | Disposition: A | Discharge status: Home / Self Care | Payer: Medicare Other | Attending: Emergency Medicine | Admitting: Emergency Medicine

## 2015-12-05 DIAGNOSIS — Z9119 Patient's noncompliance with other medical treatment and regimen: Secondary | ICD-10-CM

## 2015-12-05 DIAGNOSIS — E1165 Type 2 diabetes mellitus with hyperglycemia: Secondary | ICD-10-CM | POA: Diagnosis present

## 2015-12-05 DIAGNOSIS — Z87891 Personal history of nicotine dependence: Secondary | ICD-10-CM | POA: Diagnosis not present

## 2015-12-05 DIAGNOSIS — Z794 Long term (current) use of insulin: Secondary | ICD-10-CM | POA: Diagnosis not present

## 2015-12-05 DIAGNOSIS — I10 Essential (primary) hypertension: Secondary | ICD-10-CM | POA: Diagnosis not present

## 2015-12-05 DIAGNOSIS — Z79899 Other long term (current) drug therapy: Secondary | ICD-10-CM | POA: Diagnosis not present

## 2015-12-05 DIAGNOSIS — R110 Nausea: Secondary | ICD-10-CM | POA: Insufficient documentation

## 2015-12-05 DIAGNOSIS — E7800 Pure hypercholesterolemia, unspecified: Secondary | ICD-10-CM | POA: Diagnosis not present

## 2015-12-05 DIAGNOSIS — Z91199 Patient's noncompliance with other medical treatment and regimen due to unspecified reason: Secondary | ICD-10-CM

## 2015-12-05 DIAGNOSIS — Z7982 Long term (current) use of aspirin: Secondary | ICD-10-CM | POA: Insufficient documentation

## 2015-12-05 LAB — CBC WITH DIFFERENTIAL/PLATELET
Basophils Absolute: 0 10*3/uL (ref 0.0–0.1)
Basophils Relative: 0 %
Eosinophils Absolute: 0.2 10*3/uL (ref 0.0–0.7)
Eosinophils Relative: 2 %
HCT: 39.2 % (ref 36.0–46.0)
HEMOGLOBIN: 13 g/dL (ref 12.0–15.0)
Lymphocytes Relative: 40 %
Lymphs Abs: 3.8 10*3/uL (ref 0.7–4.0)
MCH: 28.7 pg (ref 26.0–34.0)
MCHC: 33.2 g/dL (ref 30.0–36.0)
MCV: 86.5 fL (ref 78.0–100.0)
MONOS PCT: 8 %
Monocytes Absolute: 0.7 10*3/uL (ref 0.1–1.0)
NEUTROS ABS: 4.9 10*3/uL (ref 1.7–7.7)
NEUTROS PCT: 50 %
Platelets: 214 10*3/uL (ref 150–400)
RBC: 4.53 MIL/uL (ref 3.87–5.11)
RDW: 13.2 % (ref 11.5–15.5)
WBC: 9.7 10*3/uL (ref 4.0–10.5)

## 2015-12-05 LAB — URINALYSIS, ROUTINE W REFLEX MICROSCOPIC
BILIRUBIN URINE: NEGATIVE
GLUCOSE, UA: 250 mg/dL — AB
KETONES UR: NEGATIVE mg/dL
LEUKOCYTES UA: NEGATIVE
Nitrite: NEGATIVE
Protein, ur: NEGATIVE mg/dL
Specific Gravity, Urine: 1.006 (ref 1.005–1.030)
pH: 6 (ref 5.0–8.0)

## 2015-12-05 LAB — COMPREHENSIVE METABOLIC PANEL
ALT: 13 U/L — ABNORMAL LOW (ref 14–54)
ANION GAP: 7 (ref 5–15)
AST: 16 U/L (ref 15–41)
Albumin: 3.7 g/dL (ref 3.5–5.0)
Alkaline Phosphatase: 62 U/L (ref 38–126)
BUN: 18 mg/dL (ref 6–20)
CO2: 24 mmol/L (ref 22–32)
Calcium: 9.2 mg/dL (ref 8.9–10.3)
Chloride: 104 mmol/L (ref 101–111)
Creatinine, Ser: 0.97 mg/dL (ref 0.44–1.00)
GFR calc non Af Amer: 53 mL/min — ABNORMAL LOW (ref >60)
Glucose, Bld: 284 mg/dL — ABNORMAL HIGH (ref 65–99)
Potassium: 3.9 mmol/L (ref 3.5–5.1)
Sodium: 135 mmol/L (ref 135–145)
TOTAL PROTEIN: 7.5 g/dL (ref 6.5–8.1)
Total Bilirubin: 0.7 mg/dL (ref 0.3–1.2)

## 2015-12-05 LAB — URINE MICROSCOPIC-ADD ON
Bacteria, UA: NONE SEEN
Squamous Epithelial / LPF: NONE SEEN
WBC, UA: NONE SEEN WBC/hpf (ref 0–5)

## 2015-12-05 LAB — I-STAT TROPONIN, ED: Troponin i, poc: 0 ng/mL (ref 0.00–0.08)

## 2015-12-05 LAB — PHOSPHORUS: PHOSPHORUS: 2.8 mg/dL (ref 2.5–4.6)

## 2015-12-05 LAB — CBG MONITORING, ED: Glucose-Capillary: 304 mg/dL — ABNORMAL HIGH (ref 65–99)

## 2015-12-05 LAB — MAGNESIUM: Magnesium: 1.5 mg/dL — ABNORMAL LOW (ref 1.7–2.4)

## 2015-12-05 MED ORDER — SODIUM CHLORIDE 0.9 % IV BOLUS (SEPSIS)
1000.0000 mL | Freq: Once | INTRAVENOUS | Status: AC
  Administered 2015-12-05: 1000 mL via INTRAVENOUS

## 2015-12-05 NOTE — Discharge Instructions (Signed)
Take your home medications as directed, including your lantus TWICE DAILY. Take your home nausea medication as needed for nausea. Stay well hydrated with plenty of water. Avoid eating sugary snacks. Follow up with your regular doctor in 3-5 days for ongoing management of your diabetes and nausea. Return to the ER for changes or worsening symptoms.   Diabetes Mellitus and Food It is important for you to manage your blood sugar (glucose) level. Your blood glucose level can be greatly affected by what you eat. Eating healthier foods in the appropriate amounts throughout the day at about the same time each day will help you control your blood glucose level. It can also help slow or prevent worsening of your diabetes mellitus. Healthy eating may even help you improve the level of your blood pressure and reach or maintain a healthy weight.  General recommendations for healthful eating and cooking habits include:  Eating meals and snacks regularly. Avoid going long periods of time without eating to lose weight.  Eating a diet that consists mainly of plant-based foods, such as fruits, vegetables, nuts, legumes, and whole grains.  Using low-heat cooking methods, such as baking, instead of high-heat cooking methods, such as deep frying. Work with your dietitian to make sure you understand how to use the Nutrition Facts information on food labels. HOW CAN FOOD AFFECT ME? Carbohydrates Carbohydrates affect your blood glucose level more than any other type of food. Your dietitian will help you determine how many carbohydrates to eat at each meal and teach you how to count carbohydrates. Counting carbohydrates is important to keep your blood glucose at a healthy level, especially if you are using insulin or taking certain medicines for diabetes mellitus. Alcohol Alcohol can cause sudden decreases in blood glucose (hypoglycemia), especially if you use insulin or take certain medicines for diabetes mellitus.  Hypoglycemia can be a life-threatening condition. Symptoms of hypoglycemia (sleepiness, dizziness, and disorientation) are similar to symptoms of having too much alcohol.  If your health care provider has given you approval to drink alcohol, do so in moderation and use the following guidelines:  Women should not have more than one drink per day, and men should not have more than two drinks per day. One drink is equal to:  12 oz of beer.  5 oz of wine.  1 oz of hard liquor.  Do not drink on an empty stomach.  Keep yourself hydrated. Have water, diet soda, or unsweetened iced tea.  Regular soda, juice, and other mixers might contain a lot of carbohydrates and should be counted. WHAT FOODS ARE NOT RECOMMENDED? As you make food choices, it is important to remember that all foods are not the same. Some foods have fewer nutrients per serving than other foods, even though they might have the same number of calories or carbohydrates. It is difficult to get your body what it needs when you eat foods with fewer nutrients. Examples of foods that you should avoid that are high in calories and carbohydrates but low in nutrients include:  Trans fats (most processed foods list trans fats on the Nutrition Facts label).  Regular soda.  Juice.  Candy.  Sweets, such as cake, pie, doughnuts, and cookies.  Fried foods. WHAT FOODS CAN I EAT? Eat nutrient-rich foods, which will nourish your body and keep you healthy. The food you should eat also will depend on several factors, including:  The calories you need.  The medicines you take.  Your weight.  Your blood glucose level.  Your blood pressure level.  Your cholesterol level. You should eat a variety of foods, including:  Protein.  Lean cuts of meat.  Proteins low in saturated fats, such as fish, egg whites, and beans. Avoid processed meats.  Fruits and vegetables.  Fruits and vegetables that may help control blood glucose levels,  such as apples, mangoes, and yams.  Dairy products.  Choose fat-free or low-fat dairy products, such as milk, yogurt, and cheese.  Grains, bread, pasta, and rice.  Choose whole grain products, such as multigrain bread, whole oats, and brown rice. These foods may help control blood pressure.  Fats.  Foods containing healthful fats, such as nuts, avocado, olive oil, canola oil, and fish. DOES EVERYONE WITH DIABETES MELLITUS HAVE THE SAME MEAL PLAN? Because every person with diabetes mellitus is different, there is not one meal plan that works for everyone. It is very important that you meet with a dietitian who will help you create a meal plan that is just right for you.   This information is not intended to replace advice given to you by your health care provider. Make sure you discuss any questions you have with your health care provider.   Document Released: 02/15/2005 Document Revised: 06/11/2014 Document Reviewed: 04/17/2013 Elsevier Interactive Patient Education 2016 [employer].  How to Avoid Diabetes Problems You can do a lot to prevent or slow down diabetes problems. Following your diabetes plan and taking care of yourself can reduce your risk of serious or life-threatening complications. Below, you will find certain things you can do to prevent diabetes problems. MANAGE YOUR DIABETES Follow your health care provider's, nurse educator's, and dietitian's instructions for managing your diabetes. They will teach you the basics of diabetes care. They can help answer questions you may have. Learn about diabetes and make healthy choices regarding eating and physical activity. Monitor your blood glucose level regularly. Your health care provider will help you decide how often to check your blood glucose level depending on your treatment goals and how well you are meeting them.  DO NOT USE NICOTINE Nicotine and diabetes are a dangerous combination. Nicotine raises your risk for diabetes  problems. If you quit using nicotine, you will lower your risk for heart attack, stroke, nerve disease, and kidney disease. Your cholesterol and your blood pressure levels may improve. Your blood circulation will also improve. Do not use any tobacco products, including cigarettes, chewing tobacco, or electronic cigarettes. If you need help quitting, ask your health care provider. KEEP YOUR BLOOD PRESSURE UNDER CONTROL Your health care provider will determine your individualized target blood pressure based on your age, your medicines, how long you have had diabetes, and any other medical conditions you have. Blood pressure consists of two numbers. Generally, the goal is to keep your top number (systolic pressure) at or below 130, and your bottom number (diastolic pressure) at or below 80. Your health care provider may recommend a lower target blood pressure reading, if appropriate. Meal planning, medicines, and exercise can help you reach your target blood pressure. Make sure your health care provider checks your blood pressure at every visit. KEEP YOUR CHOLESTEROL UNDER CONTROL Normal cholesterol levels will help prevent heart disease and stroke. These are the biggest health problems for people with diabetes. Keeping cholesterol levels under control can also help with blood flow. Have your cholesterol level checked at least once a year. Your health care provider may prescribe a medicine known as a statin. Statins lower your cholesterol. If you are  not taking a statin, ask your health care provider if you should be. Meal planning, exercise, and medicines can help you reach your cholesterol targets.  SCHEDULE AND KEEP YOUR ANNUAL PHYSICAL EXAMS AND EYE EXAMS Your health care provider will tell you how often he or she wants to see you depending on your plan of treatment. It is important that you keep these appointments so that possible problems can be identified early and complications can be avoided or  treated.  Every visit with your health care provider should include your weight, blood pressure, and an evaluation of your blood glucose control.  Your hemoglobin A1c should be checked:  At least twice a year if you are at your goal.  Every 3 months if there are changes in treatment.  If you are not meeting your goals.  Your blood lipids should be checked yearly. You should also be checked yearly to see if you have protein in your urine (microalbumin).  Schedule a dilated eye exam within 5 years of your diagnosis if you have type 1 diabetes, and then yearly. Schedule a dilated eye exam at diagnosis if you have type 2 diabetes, and then yearly. All exams thereafter can be extended to every 2 to 3 years if one or more exams have been normal. KEEP YOUR VACCINES CURRENT It is recommended that you receive a flu (influenza) vaccine every year. It is also recommended that you receive a pneumonia (pneumococcal) vaccine. If you are 65 years of age or older and have never received a pneumonia vaccine, this vaccine may be given as a series of two separate shots. Ask your health care provider which additional vaccines may be recommended. TAKE CARE OF YOUR FEET  Diabetes may cause you to have a poor blood supply (circulation) to your legs and feet. Because of this, the skin may be thinner, break easier, and heal more slowly. You also may have nerve damage in your legs and feet, causing decreased feeling. You may not notice minor injuries to your feet that could lead to serious problems or infections. Taking care of your feet is very important. Visual foot exams are performed at every routine medical visit. The exams check for cuts, injuries, or other problems with the feet. A comprehensive foot exam should be done yearly. This includes visual inspection as well as assessing foot pulses and testing for loss of sensation. You should also do the following:  Inspect your feet daily for cuts, calluses, blisters,  ingrown toenails, and signs of infection, such as redness, swelling, or pus.  Wash and dry your feet thoroughly, especially between the toes.  Avoid soaking your feet regularly in hot water baths.  Moisturize dry skin with lotion, avoiding areas between your toes.  Cut toenails straight across and file the edges.  Avoid shoes that do not fit well or have areas that irritate your skin.  Avoid going barefooted or wearing only socks. Your feet need protection. TAKE CARE OF YOUR TEETH People with poorly controlled diabetes are more likely to have gum (periodontal) disease. These infections make diabetes harder to control. Periodontal diseases, if left untreated, can lead to tooth loss. Brush your teeth twice a day, floss, and see your dentist for checkups and cleaning every 6 months, or 2 times a year. ASK YOUR HEALTH CARE PROVIDER ABOUT TAKING ASPIRIN Taking aspirin daily is recommended to help prevent cardiovascular disease in people with and without diabetes. Ask your health care provider if this would benefit you and what dose  he or she would recommend. DRINK RESPONSIBLY Moderate amounts of alcohol (less than 1 drink per day for adult women and less than 2 drinks per day for adult men) have a minimal effect on blood glucose if ingested with food. It is important to eat food with alcohol to avoid hypoglycemia. People should avoid alcohol if they have a history of alcohol abuse or dependence, if they are pregnant, and if they have liver disease, pancreatitis, advanced neuropathy, or severe hypertriglyceridemia. LESSEN STRESS Living with diabetes can be stressful. When you are under stress, your blood glucose may be affected in two ways:  Stress hormones may cause your blood glucose to rise.  You may be distracted from taking good care of yourself. It is a good idea to be aware of your stress level and make changes that are necessary to help you better manage challenging situations. Support  groups, planned relaxation, a hobby you enjoy, meditation, healthy relationships, and exercise all work to lower your stress level. If your efforts do not seem to be helping, get help from your health care provider or a trained mental health professional.   This information is not intended to replace advice given to you by your health care provider. Make sure you discuss any questions you have with your health care provider.   Document Released: 02/06/2011 Document Revised: 06/11/2014 Document Reviewed: 07/15/2013 Elsevier Interactive Patient Education 2016 Elsevier Inc.  Nausea, Adult Nausea means you feel sick to your stomach or need to throw up (vomit). It may be a sign of a more serious problem. If nausea gets worse, you may throw up. If you throw up a lot, you may lose too much body fluid (dehydration). HOME CARE   Get plenty of rest.  Ask your doctor how to replace body fluid losses (rehydrate).  Eat small amounts of food. Sip liquids more often.  Take all medicines as told by your doctor. GET HELP RIGHT AWAY IF:  You have a fever.  You pass out (faint).  You keep throwing up or have blood in your throw up.  You are very weak, have dry lips or a dry mouth, or you are very thirsty (dehydrated).  You have dark or bloody poop (stool).  You have very bad chest or belly (abdominal) pain.  You do not get better after 2 days, or you get worse.  You have a headache. MAKE SURE YOU:  Understand these instructions.  Will watch your condition.  Will get help right away if you are not doing well or get worse.   This information is not intended to replace advice given to you by your health care provider. Make sure you discuss any questions you have with your health care provider.   Document Released: 05/10/2011 Document Revised: 08/13/2011 Document Reviewed: 05/10/2011 Elsevier Interactive Patient Education [employer].

## 2015-12-05 NOTE — ED Notes (Signed)
Pt brought in by EMS with complaints of high blood glucose and nausea. Pt has c/o nausea for the past 3-4 days. BP in EMS 242/120, SR on monitor, CBG 250. Pt denies any nausea at this time. Pt took medication for nausea around 1500, but does not remember the name of the medication.

## 2015-12-05 NOTE — ED Provider Notes (Signed)
CSN: [id number]     Arrival date & time 12/05/15  2006 History   First MD Initiated Contact with Patient 12/05/15 2031     Chief Complaint  Patient presents with  . Hyperglycemia     (Consider location/radiation/quality/duration/timing/severity/associated sxs/prior Treatment) HPI Comments: Rhonda Conrad is a 82 y.o. female with a PMHx of DM2, HTN, HLD, and anxiety, who presents to the ED with complaints of elevated blood sugar and nausea 3-4 days. Patient states that her blood sugars have been reading "high" in the morning and then during the day they're in the 200s. She states that she has not been taking her Lantus appropriately, this morning she took 33 units but she is supposed to take an evening dose which she frequently misses. Chart review reveals that her dosage is 30 units in the morning and 22 units in the evening. She states that often times she forgets the evening dose. She is also taking Janumet 50-500 milligrams daily which she states she is compliant with. She states that she's been having nausea but has taken a medication that she has at home, cannot recall the name, which has helped. Additional symptoms include polyuria and polydipsia. Her granddaughter at bedside states that she has also been "sneaking snacks" which is likely contributing to her high blood sugars. Patient laughs and agrees with this statement.  She denies any fevers, chills, chest pain, shortness breath, abdominal pain, vomiting, diarrhea, constipation, dysuria, hematuria, melena, hematochezia, obstipation, numbness, tingling, weakness, cough, or URI symptoms. She denies any recent travel, sick contacts, suspicious food intake, alcohol use, or chronic NSAID use.  Patient is a 82 y.o. female presenting with hyperglycemia. The history is provided by the patient and medical records. No language interpreter was used.  Hyperglycemia Blood sugar level PTA:  200s, "high" this morning prior to lantus Severity:   Moderate Onset quality:  Unable to specify Timing:  Intermittent Progression:  Waxing and waning Chronicity:  Chronic Diabetes status:  Controlled with oral medications and controlled with insulin Current diabetic therapy:  Supposed to be taking Lantus 30U qAM and 22U qPM but not taking regularly, last took Lantus 33U this morning; also on Janumet 50-500mg  daily which she's taking Time since last antidiabetic medication:  12 hours Context: noncompliance and recent change in diet   Relieved by:  Insulin Ineffective treatments:  None tried Associated symptoms: increased thirst, nausea and polyuria   Associated symptoms: no abdominal pain, no chest pain, no confusion, no dysuria, no fever, no shortness of breath, no vomiting and no weakness   Risk factors: obesity     Past Medical History  Diagnosis Date  . Diabetes mellitus without complication ([place])   . Hypertension   . High cholesterol   . Anxiety    Past Surgical History  Procedure Laterality Date  . Vaginal polyp removal    . Left heart catheterization with coronary angiogram N/A 06/15/2013    Procedure: LEFT HEART CATHETERIZATION WITH CORONARY ANGIOGRAM;  Surgeon: Clent Demark, MD;  Location: [facility] CATH LAB;  Service: Cardiovascular;  Laterality: N/A;   No family history on file. Social History  Substance Use Topics  . Smoking status: Former Research scientist (life sciences)  . Smokeless tobacco: Not on file  . Alcohol Use: No   OB History    No data available     Review of Systems  Constitutional: Negative for fever and chills.  HENT: Negative for rhinorrhea and sore throat.   Respiratory: Negative for cough and shortness of breath.  Cardiovascular: Negative for chest pain.  Gastrointestinal: Positive for nausea. Negative for vomiting, abdominal pain, diarrhea and constipation.  Endocrine: Positive for polydipsia and polyuria.  Genitourinary: Negative for dysuria and hematuria.  Musculoskeletal: Negative for myalgias and arthralgias.  Skin:  Negative for color change.  Allergic/Immunologic: Positive for immunocompromised state (DM2).  Neurological: Negative for weakness and numbness.  Psychiatric/Behavioral: Negative for confusion.   10 Systems reviewed and are negative for acute change except as noted in the HPI.    Allergies  Penicillins  Home Medications   Prior to Admission medications   Medication Sig Start Date End Date Taking? Authorizing Provider  ALPRAZolam Duanne Moron) 0.5 MG tablet Take 0.5 mg by mouth 3 (three) times daily as needed for anxiety.  02/12/14   Historical Provider, MD  amLODipine (NORVASC) 10 MG tablet Take 10 mg by mouth daily.    Historical Provider, MD  aspirin EC 81 MG EC tablet Take 1 tablet (81 mg total) by mouth daily. 06/15/13   Delfina Redwood, MD  busPIRone (BUSPAR) 7.5 MG tablet Take 7.5 mg by mouth 2 (two) times daily.    Historical Provider, MD  colchicine (COLCRYS) 0.6 MG tablet Take 0.6 mg by mouth 2 (two) times daily.    Historical Provider, MD  insulin glargine (LANTUS) 100 UNIT/ML injection Inject 22-30 Units into the skin 2 (two) times daily. Inject 30 units in the morning and 22 units in the evening    Historical Provider, MD  losartan (COZAAR) 50 MG tablet Take 50 mg by mouth daily.    Historical Provider, MD  metoprolol (LOPRESSOR) 50 MG tablet Take 50 mg by mouth 2 (two) times daily.    Historical Provider, MD  oxyCODONE-acetaminophen (PERCOCET) 7.5-325 MG per tablet Take 0.5-1 tablets by mouth 2 (two) times daily as needed for pain.    Historical Provider, MD  pantoprazole (PROTONIX) 40 MG tablet Take 40 mg by mouth daily.    Historical Provider, MD  simvastatin (ZOCOR) 20 MG tablet Take 20 mg by mouth every evening.    Historical Provider, MD  sitaGLIPtin-metformin (JANUMET) 50-500 MG per tablet Take 1 tablet by mouth daily.    Historical Provider, MD  triamterene-hydrochlorothiazide (MAXZIDE-25) 37.5-25 MG per tablet Take 1 tablet by mouth every morning.    Historical Provider, MD    BP 173/75 mmHg  Pulse 82  Temp(Src) 98.7 F (37.1 C) (Oral)  Resp 20  SpO2 98% Physical Exam  Constitutional: She is oriented to person, place, and time. Vital signs are normal. She appears well-developed and well-nourished.  Non-toxic appearance. No distress.  Afebrile, nontoxic, NAD, very pleasant woman who appears younger than stated age, laughing and talking with granddaughter at bedside  HENT:  Head: Normocephalic and atraumatic.  Mouth/Throat: Oropharynx is clear and moist and mucous membranes are normal.  Eyes: Conjunctivae and EOM are normal. Right eye exhibits no discharge. Left eye exhibits no discharge.  Neck: Normal range of motion. Neck supple.  Cardiovascular: Normal rate, regular rhythm, normal heart sounds and intact distal pulses.  Exam reveals no gallop and no friction rub.   No murmur heard. Pulmonary/Chest: Effort normal and breath sounds normal. No respiratory distress. She has no decreased breath sounds. She has no wheezes. She has no rhonchi. She has no rales.  Abdominal: Soft. Normal appearance and bowel sounds are normal. She exhibits no distension. There is no tenderness. There is no rigidity, no rebound, no guarding, no CVA tenderness, no tenderness at McBurney's point and negative Murphy's sign.  Soft,  NTND, +BS throughout, no r/g/r, neg murphy's, neg mcburney's, no CVA TTP   Musculoskeletal: Normal range of motion.  Neurological: She is alert and oriented to person, place, and time. She has normal strength. No sensory deficit.  Skin: Skin is warm, dry and intact. No rash noted.  Psychiatric: She has a normal mood and affect.  Nursing note and vitals reviewed.   ED Course  Procedures (including critical care time) Labs Review Labs Reviewed  COMPREHENSIVE METABOLIC PANEL - Abnormal; Notable for the following:    Glucose, Bld 284 (*)    ALT 13 (*)    GFR calc non Af Amer 53 (*)    All other components within normal limits  URINALYSIS, ROUTINE W REFLEX  MICROSCOPIC (NOT AT [facility]) - Abnormal; Notable for the following:    Glucose, UA 250 (*)    Hgb urine dipstick TRACE (*)    All other components within normal limits  MAGNESIUM - Abnormal; Notable for the following:    Magnesium 1.5 (*)    All other components within normal limits  CBG MONITORING, ED - Abnormal; Notable for the following:    Glucose-Capillary 304 (*)    All other components within normal limits  CBC WITH DIFFERENTIAL/PLATELET  PHOSPHORUS  URINE MICROSCOPIC-ADD ON  I-STAT TROPOININ, ED    Imaging Review No results found. I have personally reviewed and evaluated these images and lab results as part of my medical decision-making.   EKG Interpretation   Date/Time:  Monday December 05 2015 20:59:30 EDT Ventricular Rate:  71 PR Interval:    QRS Duration: 98 QT Interval:  393 QTC Calculation: 428 R Axis:   -3 Text Interpretation:  Sinus rhythm Probable left atrial enlargement  Anteroseptal infarct, old No significant change since last tracing  Confirmed by Lonia Skinner ([id number]) on 12/05/2015 9:51:21 PM      MDM   Final diagnoses:  Type 2 diabetes mellitus with hyperglycemia, with long-term current use of insulin (HCC)  Nausea  Noncompliance with diabetes treatment    82 y.o. female here with elevated CBGs and nausea x3-4 days. Took a medication at home for nausea and she feels better, can't recall name of medicine. Admits to not being compliant with her lantus which is likely why she's having hyperglycemia, and her granddaughter states that she's been sneaking snacks as well. +Polydipsia and polyuria. No abdominal pain or tenderness, no CP/SOB, no URI symptoms. Overall exam unremarkable. CBG upon arrival 305. Will get labs, trop, EKG, and U/A. Will give fluids and reassess shortly.   9:53 PM Trop neg, EKG unremarkable. CBC w/diff unremarkable. CMP with gluc 284 without abnormal bicarb or anion gap, phos WNL, Mg 1.5 slightly low but doubt need for repletion. U/A  without evidence of UTI, no ketones. Overall, hyperglycemia likely from noncompliance. Stressed compliance with meds and diet regimen, and use of home nausea meds for nausea. F/up with PCP in 3-5 days for recheck. Discussed case with my attending Dr. Alfonse Spruce who agrees with plan. I explained the diagnosis and have given explicit precautions to return to the ER including for any other new or worsening symptoms. The patient understands and accepts the medical plan as it's been dictated and I have answered their questions. Discharge instructions concerning home care and prescriptions have been given. The patient is STABLE and is discharged to home in good condition.   BP 173/75 mmHg  Pulse 82  Temp(Src) 98.7 F (37.1 C) (Oral)  Resp 20  SpO2 98%  Meds ordered this  encounter  Medications  . sodium chloride 0.9 % bolus 1,000 mL    Sig:      Takari Duncombe Camprubi-Soms, PA-C 12/05/15 2155  Harvel Quale, MD 12/08/15 [phone]

## 2016-01-10 ENCOUNTER — Emergency Department (HOSPITAL_COMMUNITY): Payer: Medicare Other

## 2016-01-10 ENCOUNTER — Encounter (HOSPITAL_COMMUNITY): Payer: Self-pay | Admitting: Emergency Medicine

## 2016-01-10 ENCOUNTER — Emergency Department (HOSPITAL_COMMUNITY)
Admission: EM | Admit: 2016-01-10 | Discharge: 2016-01-10 | Disposition: A | Discharge status: Home / Self Care | Payer: Medicare Other | Attending: Emergency Medicine | Admitting: Emergency Medicine

## 2016-01-10 DIAGNOSIS — M6281 Muscle weakness (generalized): Secondary | ICD-10-CM | POA: Diagnosis present

## 2016-01-10 DIAGNOSIS — I10 Essential (primary) hypertension: Secondary | ICD-10-CM | POA: Diagnosis not present

## 2016-01-10 DIAGNOSIS — Z7982 Long term (current) use of aspirin: Secondary | ICD-10-CM | POA: Insufficient documentation

## 2016-01-10 DIAGNOSIS — Z79899 Other long term (current) drug therapy: Secondary | ICD-10-CM | POA: Insufficient documentation

## 2016-01-10 DIAGNOSIS — Z7984 Long term (current) use of oral hypoglycemic drugs: Secondary | ICD-10-CM | POA: Insufficient documentation

## 2016-01-10 DIAGNOSIS — R110 Nausea: Secondary | ICD-10-CM | POA: Diagnosis not present

## 2016-01-10 DIAGNOSIS — Z87891 Personal history of nicotine dependence: Secondary | ICD-10-CM | POA: Insufficient documentation

## 2016-01-10 DIAGNOSIS — Z794 Long term (current) use of insulin: Secondary | ICD-10-CM | POA: Insufficient documentation

## 2016-01-10 DIAGNOSIS — R531 Weakness: Secondary | ICD-10-CM

## 2016-01-10 DIAGNOSIS — E119 Type 2 diabetes mellitus without complications: Secondary | ICD-10-CM | POA: Insufficient documentation

## 2016-01-10 LAB — CBC WITH DIFFERENTIAL/PLATELET
BASOS ABS: 0 10*3/uL (ref 0.0–0.1)
Basophils Relative: 0 %
EOS PCT: 1 %
Eosinophils Absolute: 0.1 10*3/uL (ref 0.0–0.7)
HEMATOCRIT: 38.4 % (ref 36.0–46.0)
Hemoglobin: 12.8 g/dL (ref 12.0–15.0)
LYMPHS ABS: 2.9 10*3/uL (ref 0.7–4.0)
LYMPHS PCT: 35 %
MCH: 29.4 pg (ref 26.0–34.0)
MCHC: 33.3 g/dL (ref 30.0–36.0)
MCV: 88.1 fL (ref 78.0–100.0)
MONO ABS: 0.7 10*3/uL (ref 0.1–1.0)
MONOS PCT: 8 %
NEUTROS ABS: 4.6 10*3/uL (ref 1.7–7.7)
Neutrophils Relative %: 56 %
PLATELETS: 205 10*3/uL (ref 150–400)
RBC: 4.36 MIL/uL (ref 3.87–5.11)
RDW: 13.3 % (ref 11.5–15.5)
WBC: 8.3 10*3/uL (ref 4.0–10.5)

## 2016-01-10 LAB — URINALYSIS, ROUTINE W REFLEX MICROSCOPIC
Bilirubin Urine: NEGATIVE
GLUCOSE, UA: NEGATIVE mg/dL
Ketones, ur: NEGATIVE mg/dL
LEUKOCYTES UA: NEGATIVE
Nitrite: NEGATIVE
PROTEIN: NEGATIVE mg/dL
SPECIFIC GRAVITY, URINE: 1.01 (ref 1.005–1.030)
pH: 5.5 (ref 5.0–8.0)

## 2016-01-10 LAB — I-STAT TROPONIN, ED: Troponin i, poc: 0 ng/mL (ref 0.00–0.08)

## 2016-01-10 LAB — COMPREHENSIVE METABOLIC PANEL
ALK PHOS: 75 U/L (ref 38–126)
ALT: 12 U/L — ABNORMAL LOW (ref 14–54)
ANION GAP: 9 (ref 5–15)
AST: 22 U/L (ref 15–41)
Albumin: 3.9 g/dL (ref 3.5–5.0)
BILIRUBIN TOTAL: 0.4 mg/dL (ref 0.3–1.2)
BUN: 23 mg/dL — ABNORMAL HIGH (ref 6–20)
CALCIUM: 9.1 mg/dL (ref 8.9–10.3)
CO2: 24 mmol/L (ref 22–32)
Chloride: 108 mmol/L (ref 101–111)
Creatinine, Ser: 1.04 mg/dL — ABNORMAL HIGH (ref 0.44–1.00)
GFR calc non Af Amer: 49 mL/min — ABNORMAL LOW (ref >60)
GFR, EST AFRICAN AMERICAN: 56 mL/min — AB (ref >60)
Glucose, Bld: 136 mg/dL — ABNORMAL HIGH (ref 65–99)
Potassium: 4.1 mmol/L (ref 3.5–5.1)
SODIUM: 141 mmol/L (ref 135–145)
Total Protein: 7.8 g/dL (ref 6.5–8.1)

## 2016-01-10 LAB — URINE MICROSCOPIC-ADD ON

## 2016-01-10 LAB — CBG MONITORING, ED: Glucose-Capillary: 128 mg/dL — ABNORMAL HIGH (ref 65–99)

## 2016-01-10 LAB — LIPASE, BLOOD: LIPASE: 26 U/L (ref 11–51)

## 2016-01-10 MED ORDER — ACETAMINOPHEN 325 MG PO TABS
650.0000 mg | ORAL_TABLET | Freq: Once | ORAL | Status: AC
  Administered 2016-01-10: 650 mg via ORAL
  Filled 2016-01-10: qty 2

## 2016-01-10 MED ORDER — SODIUM CHLORIDE 0.9 % IV BOLUS (SEPSIS)
500.0000 mL | Freq: Once | INTRAVENOUS | Status: AC
  Administered 2016-01-10: 500 mL via INTRAVENOUS

## 2016-01-10 NOTE — ED Notes (Signed)
Bed: WA24 Expected date:  Expected time:  Means of arrival:  Comments: weakness 

## 2016-01-10 NOTE — ED Notes (Signed)
Pt in ct 

## 2016-01-10 NOTE — ED Notes (Signed)
Generalized weakness all over

## 2016-01-10 NOTE — ED Triage Notes (Signed)
Pt comes to ed via ems, c/o generalized weakness, N/D  No vomiting. Pt hx of diabetes type 2, chronic back pain, gout, HTN. Allergic to penicillin.  Alert x 4, 12 lead unremarkable.  20 in left hand KVO, 100 cc of 500 bag.   V/s 133/73, hr 80, rr16, 98 room air, cbg 139 ( 4:37am)  Pt comes from and family on the way.

## 2016-01-10 NOTE — ED Provider Notes (Signed)
[place] DEPT Provider Note   CSN: [id number] Arrival date & time: 01/10/16  0530  First Provider Contact:  First MD Initiated Contact with Patient 01/10/16 [phone]     History   Chief Complaint Chief Complaint  Patient presents with  . Weakness    general weakness   . Nausea   HPI  Rhonda Conrad is an 82 y.o. female with history of T2DM, HTN, HLD who presents to the ED for evaluation of generalized weakness. She states she was in her usual state of health until yesterday when she started feeling generally unwell all day. She states she had some intermittent nausea and had a couple episodes of loose, watery stools. She reports associated generalized abdominal pain that has since resolved. She states she woke up early this morning to use the restroom, was still not feeling well, and as she was trying to get to the restroom she felt dizzy and lightheaded like she might pass out. She denies LOC. In the ED now pt states she generally feels a bit weak. She denies any pain. She denies chest pain, abdominal pain, shortness of breath. She denies headache or blurred vision. She states she does not feel dizzy anymore. She has not tried anything to alleviate her symptoms. Denies fever, chills, dysuria, urinary frequency/urgency.  Past Medical History:  Diagnosis Date  . Anxiety   . Diabetes mellitus without complication ([place])   . High cholesterol   . Hypertension     Patient Active Problem List   Diagnosis Date Noted  . Anxiety state, unspecified 06/14/2013  . Chest pain 06/13/2013  . DM (diabetes mellitus) ([place]) 06/13/2013  . Hypertension 06/13/2013  . Hyperlipidemia 06/13/2013    Past Surgical History:  Procedure Laterality Date  . LEFT HEART CATHETERIZATION WITH CORONARY ANGIOGRAM N/A 06/15/2013   Procedure: LEFT HEART CATHETERIZATION WITH CORONARY ANGIOGRAM;  Surgeon: Clent Demark, MD;  Location: [facility] CATH LAB;  Service: Cardiovascular;  Laterality: N/A;  . vaginal polyp  removal      OB History    No data available       Home Medications    Prior to Admission medications   Medication Sig Start Date End Date Taking? Authorizing Provider  ALPRAZolam Duanne Moron) 0.5 MG tablet Take 0.5 mg by mouth 3 (three) times daily as needed for anxiety.  02/12/14  Yes Historical Provider, MD  amLODipine (NORVASC) 10 MG tablet Take 10 mg by mouth daily.   Yes Historical Provider, MD  aspirin EC 81 MG EC tablet Take 1 tablet (81 mg total) by mouth daily. 06/15/13  Yes Delfina Redwood, MD  colchicine (COLCRYS) 0.6 MG tablet Take 0.6 mg by mouth 2 (two) times daily as needed (gout).    Yes Historical Provider, MD  insulin glargine (LANTUS) 100 UNIT/ML injection Inject 34 Units into the skin daily. Inject 34 units in the morning   Yes Historical Provider, MD  losartan (COZAAR) 50 MG tablet Take 50 mg by mouth daily.   Yes Historical Provider, MD  metoprolol (LOPRESSOR) 50 MG tablet Take 50 mg by mouth 2 (two) times daily.   Yes Historical Provider, MD  oxyCODONE-acetaminophen (PERCOCET) 7.5-325 MG per tablet Take 0.5-1 tablets by mouth 2 (two) times daily as needed for pain.   Yes Historical Provider, MD  pantoprazole (PROTONIX) 40 MG tablet Take 40 mg by mouth daily.   Yes Historical Provider, MD  simvastatin (ZOCOR) 20 MG tablet Take 20 mg by mouth every evening.   Yes Historical Provider, MD  sitaGLIPtin-metformin (JANUMET) 50-500 MG per tablet Take 1 tablet by mouth daily.   Yes Historical Provider, MD  triamterene-hydrochlorothiazide (MAXZIDE-25) 37.5-25 MG per tablet Take 1 tablet by mouth every morning.   Yes Historical Provider, MD    Family History No family history on file.  Social History Social History  Substance Use Topics  . Smoking status: Former Research scientist (life sciences)  . Smokeless tobacco: Not on file  . Alcohol use No     Allergies   Penicillins   Review of Systems Review of Systems 10 Systems reviewed and are negative for acute change except as noted in the  HPI.   Physical Exam Updated Vital Signs BP 163/87 (BP Location: Right Arm)   Pulse 73   Temp 98.5 F (36.9 C) (Oral)   Resp 17   Ht 5\' 2"  (1.575 m)   Wt 85.3 kg   SpO2 97%   BMI 34.39 kg/m   Physical Exam  Constitutional: She is oriented to person, place, and time.  HENT:  Right Ear: External ear normal.  Left Ear: External ear normal.  Nose: Nose normal.  Mouth/Throat: No oropharyngeal exudate.  MM dry  Eyes: Conjunctivae and EOM are normal. Pupils are equal, round, and reactive to light.  Neck: Normal range of motion. Neck supple.  Cardiovascular: Normal rate, regular rhythm, normal heart sounds and intact distal pulses.   Pulmonary/Chest: Effort normal and breath sounds normal. No respiratory distress. She has no wheezes.  Abdominal: Soft. Bowel sounds are normal. She exhibits no distension. There is no tenderness. There is no rebound and no guarding.  Musculoskeletal: She exhibits no edema.  Lymphadenopathy:    She has no cervical adenopathy.  Neurological: She is alert and oriented to person, place, and time. No cranial nerve deficit.  Skin: Skin is warm and dry.  Psychiatric: She has a normal mood and affect.  Nursing note and vitals reviewed.  Orthostatic VS for the past 24 hrs:  BP- Lying Pulse- Lying BP- Sitting Pulse- Sitting BP- Standing at 0 minutes Pulse- Standing at 0 minutes  01/10/16 0710 157/87 66 162/81 64 154/81 74     ED Treatments / Results  Labs (all labs ordered are listed, but only abnormal results are displayed) Labs Reviewed  COMPREHENSIVE METABOLIC PANEL - Abnormal; Notable for the following:       Result Value   Glucose, Bld 136 (*)    BUN 23 (*)    Creatinine, Ser 1.04 (*)    ALT 12 (*)    GFR calc non Af Amer 49 (*)    GFR calc Af Amer 56 (*)    All other components within normal limits  URINALYSIS, ROUTINE W REFLEX MICROSCOPIC (NOT AT [facility]) - Abnormal; Notable for the following:    Hgb urine dipstick TRACE (*)    All other  components within normal limits  URINE MICROSCOPIC-ADD ON - Abnormal; Notable for the following:    Squamous Epithelial / LPF 0-5 (*)    Bacteria, UA RARE (*)    All other components within normal limits  CBG MONITORING, ED - Abnormal; Notable for the following:    Glucose-Capillary 128 (*)    All other components within normal limits  CBC WITH DIFFERENTIAL/PLATELET  LIPASE, BLOOD  I-STAT TROPOININ, ED    EKG  EKG Interpretation  Date/Time:  Tuesday January 10 2016 06:07:43 EDT Ventricular Rate:  77 PR Interval:    QRS Duration: 101 QT Interval:  391 QTC Calculation: 443 R Axis:   2 Text  Interpretation:  Sinus rhythm Abnormal R-wave progression, early transition When compared with ECG of 12/05/2015, No significant change was found Confirmed by [facility]  MD, DAVID ([id number]) on 01/10/2016 6:25:41 AM       Radiology Dg Chest 2 View  Result Date: 01/10/2016 CLINICAL DATA:  Acute onset of generalized weakness and dizziness. Lightheadedness. Initial encounter. EXAM: CHEST  2 VIEW COMPARISON:  Chest radiograph performed 05/05/2014 FINDINGS: The lungs are well-aerated and clear. There is no evidence of focal opacification, pleural effusion or pneumothorax. The heart is borderline enlarged. No acute osseous abnormalities are seen. IMPRESSION: No acute cardiopulmonary process seen.  Borderline cardiomegaly. Electronically Signed   By: Garald Balding M.D.   On: 01/10/2016 06:44    Procedures Procedures (including critical care time)  Medications Ordered in ED Medications  sodium chloride 0.9 % bolus 500 mL (500 mLs Intravenous New Bag/Given 01/10/16 [id number])  acetaminophen (TYLENOL) tablet 650 mg (650 mg Oral Given 01/10/16 0805)     Initial Impression / Assessment and Plan / ED Course  I have reviewed the triage vital signs and the nursing notes.  Pertinent labs & imaging results that were available during my care of the patient were reviewed by me and considered in my medical decision making (see  chart for details).  Clinical Course    Labs unrevealing. Pt feels improved after fluids. She is ambulatory with steady gait and remains asymptomatic. Her exam is nonfocal. She is nontoxic appearing and hemodynamically stable. Encouraged continuing PO hydration at home. Suspect some dehydration from viral GI illness given report of abd pain, diarrhea. Doubt emergent etiology requiring further evaluation today. Instructed close PCP follow up. ER return precautions given.  Final Clinical Impressions(s) / ED Diagnoses   Final diagnoses:  Generalized weakness    New Prescriptions New Prescriptions   No medications on file     Carmelina Dane [id number]    Delora Fuel, MD [id number]

## 2016-01-10 NOTE — Discharge Instructions (Signed)
You were seen in the emergency room today for evaluation of generalized weakness. Your labs were unremarkable. Your chest x-ray was negative for acute findings. Please drink plenty of water to stay hydrated. Take all of your home medications as prescribed. Please follow up with your primary care provider this week. Return to the emergency room for new or worsening symptoms.

## 2016-04-19 ENCOUNTER — Emergency Department (HOSPITAL_COMMUNITY)
Admission: EM | Admit: 2016-04-19 | Discharge: 2016-04-19 | Disposition: A | Discharge status: Home / Self Care | Payer: Medicare Other | Attending: Emergency Medicine | Admitting: Emergency Medicine

## 2016-04-19 ENCOUNTER — Encounter (HOSPITAL_COMMUNITY): Payer: Self-pay | Admitting: Emergency Medicine

## 2016-04-19 DIAGNOSIS — E119 Type 2 diabetes mellitus without complications: Secondary | ICD-10-CM | POA: Diagnosis not present

## 2016-04-19 DIAGNOSIS — Z79899 Other long term (current) drug therapy: Secondary | ICD-10-CM | POA: Insufficient documentation

## 2016-04-19 DIAGNOSIS — Z87891 Personal history of nicotine dependence: Secondary | ICD-10-CM | POA: Insufficient documentation

## 2016-04-19 DIAGNOSIS — I10 Essential (primary) hypertension: Secondary | ICD-10-CM | POA: Diagnosis present

## 2016-04-19 DIAGNOSIS — Z794 Long term (current) use of insulin: Secondary | ICD-10-CM | POA: Diagnosis not present

## 2016-04-19 DIAGNOSIS — Z7982 Long term (current) use of aspirin: Secondary | ICD-10-CM | POA: Insufficient documentation

## 2016-04-19 DIAGNOSIS — R112 Nausea with vomiting, unspecified: Secondary | ICD-10-CM | POA: Insufficient documentation

## 2016-04-19 DIAGNOSIS — R110 Nausea: Secondary | ICD-10-CM

## 2016-04-19 LAB — CBC
HCT: 38.6 % (ref 36.0–46.0)
Hemoglobin: 12.8 g/dL (ref 12.0–15.0)
MCH: 29 pg (ref 26.0–34.0)
MCHC: 33.2 g/dL (ref 30.0–36.0)
MCV: 87.5 fL (ref 78.0–100.0)
PLATELETS: 205 10*3/uL (ref 150–400)
RBC: 4.41 MIL/uL (ref 3.87–5.11)
RDW: 13.3 % (ref 11.5–15.5)
WBC: 10 10*3/uL (ref 4.0–10.5)

## 2016-04-19 LAB — COMPREHENSIVE METABOLIC PANEL
ALK PHOS: 61 U/L (ref 38–126)
ALT: 17 U/L (ref 14–54)
AST: 19 U/L (ref 15–41)
Albumin: 3.7 g/dL (ref 3.5–5.0)
Anion gap: 7 (ref 5–15)
BILIRUBIN TOTAL: 0.2 mg/dL — AB (ref 0.3–1.2)
BUN: 20 mg/dL (ref 6–20)
CALCIUM: 9.2 mg/dL (ref 8.9–10.3)
CO2: 23 mmol/L (ref 22–32)
CREATININE: 1.1 mg/dL — AB (ref 0.44–1.00)
Chloride: 108 mmol/L (ref 101–111)
GFR calc Af Amer: 53 mL/min — ABNORMAL LOW (ref >60)
GFR, EST NON AFRICAN AMERICAN: 45 mL/min — AB (ref >60)
Glucose, Bld: 168 mg/dL — ABNORMAL HIGH (ref 65–99)
Potassium: 4.1 mmol/L (ref 3.5–5.1)
Sodium: 138 mmol/L (ref 135–145)
TOTAL PROTEIN: 7.2 g/dL (ref 6.5–8.1)

## 2016-04-19 LAB — URINALYSIS, ROUTINE W REFLEX MICROSCOPIC
BILIRUBIN URINE: NEGATIVE
GLUCOSE, UA: NEGATIVE mg/dL
KETONES UR: NEGATIVE mg/dL
NITRITE: NEGATIVE
PROTEIN: NEGATIVE mg/dL
Specific Gravity, Urine: 1.006 (ref 1.005–1.030)
pH: 6 (ref 5.0–8.0)

## 2016-04-19 LAB — URINE MICROSCOPIC-ADD ON

## 2016-04-19 LAB — LIPASE, BLOOD: Lipase: 20 U/L (ref 11–51)

## 2016-04-19 MED ORDER — ONDANSETRON 4 MG PO TBDP: 4.0000 mg | ORAL_TABLET | Freq: Three times a day (TID) | ORAL | 0 refills | Status: DC | PRN

## 2016-04-19 MED ORDER — ACETAMINOPHEN 325 MG PO TABS
650.0000 mg | ORAL_TABLET | Freq: Once | ORAL | Status: AC
  Administered 2016-04-19: 650 mg via ORAL
  Filled 2016-04-19: qty 2

## 2016-04-19 MED ORDER — ONDANSETRON 4 MG PO TBDP
4.0000 mg | ORAL_TABLET | Freq: Once | ORAL | Status: AC
  Administered 2016-04-19: 4 mg via ORAL
  Filled 2016-04-19: qty 1

## 2016-04-19 NOTE — ED Notes (Signed)
Pt ambulated to restroom without assistance.

## 2016-04-19 NOTE — ED Notes (Signed)
Below order not completed by EW. 

## 2016-04-19 NOTE — ED Triage Notes (Signed)
Per EMS pt from home with daughter stating she has not felt well the past 2 days and her blood pressure is elevated today. Pt states some nausea and diarrhea. EMS reports initial BP 220/110 then last BP of 150/90. Pt reports taking her BP medication about an hour ago but is unsure which medication.

## 2016-04-19 NOTE — ED Notes (Signed)
Bed: [id number] Expected date:  Expected time:  Means of arrival:  Comments: 82 yo, evaluation

## 2016-04-19 NOTE — ED Provider Notes (Signed)
[facility] DEPT Provider Note   CSN: [id number] Arrival date & time: 04/19/16  1830     History   Chief Complaint Chief Complaint  Patient presents with  . Hypertension  . Nausea  . Diarrhea    HPI Rhonda Conrad is a 82 y.o. female.  The history is provided by the patient. No language interpreter was used.  Hypertension   Diarrhea      Rhonda Conrad is a 82 y.o. female who presents to the Emergency Department complaining of hypertension.  She reports 2 days of intermittent malaise and nausea. Today she took her blood pressure and noted that it was elevated. She denies any fevers, chest pain, shortness of breath, abdominal pain, vomiting, diarrhea, numbness, weakness. Her blood pressure was elevated home and she took her home meds as prescribed. When she arrived to the emergency department she developed a mild headache. Symptoms are mild and waxing and waning.  Past Medical History:  Diagnosis Date  . Anxiety   . Diabetes mellitus without complication ([place])   . High cholesterol   . Hypertension     Patient Active Problem List   Diagnosis Date Noted  . Anxiety state, unspecified 06/14/2013  . Chest pain 06/13/2013  . DM (diabetes mellitus) ([place]) 06/13/2013  . Hypertension 06/13/2013  . Hyperlipidemia 06/13/2013    Past Surgical History:  Procedure Laterality Date  . LEFT HEART CATHETERIZATION WITH CORONARY ANGIOGRAM N/A 06/15/2013   Procedure: LEFT HEART CATHETERIZATION WITH CORONARY ANGIOGRAM;  Surgeon: Clent Demark, MD;  Location: [facility] CATH LAB;  Service: Cardiovascular;  Laterality: N/A;  . vaginal polyp removal      OB History    No data available       Home Medications    Prior to Admission medications   Medication Sig Start Date End Date Taking? Authorizing Provider  ALPRAZolam Duanne Moron) 0.5 MG tablet Take 0.5 mg by mouth 3 (three) times daily as needed for anxiety.  02/12/14  Yes Historical Provider, MD  amLODipine (NORVASC) 10 MG tablet  Take 10 mg by mouth daily.   Yes Historical Provider, MD  aspirin EC 81 MG EC tablet Take 1 tablet (81 mg total) by mouth daily. 06/15/13  Yes Delfina Redwood, MD  colchicine (COLCRYS) 0.6 MG tablet Take 0.6 mg by mouth 2 (two) times daily as needed (gout).    Yes Historical Provider, MD  insulin glargine (LANTUS) 100 UNIT/ML injection Inject 34 Units into the skin 2 (two) times daily.    Yes Historical Provider, MD  losartan (COZAAR) 100 MG tablet TK 1 T PO QD 02/07/16  Yes Historical Provider, MD  oxyCODONE-acetaminophen (PERCOCET) 7.5-325 MG per tablet Take 0.5-1 tablets by mouth 2 (two) times daily as needed for pain.   Yes Historical Provider, MD  simvastatin (ZOCOR) 20 MG tablet Take 20 mg by mouth every evening.   Yes Historical Provider, MD  sitaGLIPtin-metformin (JANUMET) 50-500 MG per tablet Take 1 tablet by mouth daily.   Yes Historical Provider, MD  triamterene-hydrochlorothiazide (MAXZIDE-25) 37.5-25 MG per tablet Take 1 tablet by mouth every morning.   Yes Historical Provider, MD  losartan (COZAAR) 50 MG tablet Take 50 mg by mouth daily.    Historical Provider, MD  metoprolol (LOPRESSOR) 50 MG tablet Take 50 mg by mouth 2 (two) times daily.    Historical Provider, MD  ondansetron (ZOFRAN ODT) 4 MG disintegrating tablet Take 1 tablet (4 mg total) by mouth every 8 (eight) hours as needed for nausea or vomiting.  04/19/16   Quintella Reichert, MD  pantoprazole (PROTONIX) 40 MG tablet Take 40 mg by mouth 2 (two) times daily as needed (heartburn).     Historical Provider, MD    Family History No family history on file.  Social History Social History  Substance Use Topics  . Smoking status: Former Research scientist (life sciences)  . Smokeless tobacco: Not on file  . Alcohol use No     Allergies   Penicillins   Review of Systems Review of Systems  Gastrointestinal: Positive for diarrhea.  All other systems reviewed and are negative.    Physical Exam Updated Vital Signs BP 157/86 (BP Location: Left  Arm)   Pulse 84   Temp 98.2 F (36.8 C) (Oral)   Resp 18   SpO2 95%   Physical Exam  Constitutional: She is oriented to person, place, and time. She appears well-developed and well-nourished.  HENT:  Head: Normocephalic and atraumatic.  Cardiovascular: Normal rate and regular rhythm.   No murmur heard. Pulmonary/Chest: Effort normal and breath sounds normal. No respiratory distress.  Abdominal: Soft. There is no tenderness. There is no rebound and no guarding.  Musculoskeletal: She exhibits no edema or tenderness.  Neurological: She is alert and oriented to person, place, and time.  Skin: Skin is warm and dry.  Psychiatric: She has a normal mood and affect. Her behavior is normal.  Nursing note and vitals reviewed.    ED Treatments / Results  Labs (all labs ordered are listed, but only abnormal results are displayed) Labs Reviewed  COMPREHENSIVE METABOLIC PANEL - Abnormal; Notable for the following:       Result Value   Glucose, Bld 168 (*)    Creatinine, Ser 1.10 (*)    Total Bilirubin 0.2 (*)    GFR calc non Af Amer 45 (*)    GFR calc Af Amer 53 (*)    All other components within normal limits  URINALYSIS, ROUTINE W REFLEX MICROSCOPIC (NOT AT [facility]) - Abnormal; Notable for the following:    Hgb urine dipstick TRACE (*)    Leukocytes, UA TRACE (*)    All other components within normal limits  URINE MICROSCOPIC-ADD ON - Abnormal; Notable for the following:    Squamous Epithelial / LPF 0-5 (*)    Bacteria, UA RARE (*)    All other components within normal limits  URINE CULTURE  LIPASE, BLOOD  CBC    EKG  EKG Interpretation  Date/Time:  Thursday April 19 2016 20:53:48 EST Ventricular Rate:  82 PR Interval:    QRS Duration: 94 QT Interval:  372 QTC Calculation: 435 R Axis:   1 Text Interpretation:  Sinus rhythm Confirmed by Hazle Coca [phone]) on 04/19/2016 9:15:49 PM       Radiology No results found.  Procedures Procedures (including critical care  time)  Medications Ordered in ED Medications  acetaminophen (TYLENOL) tablet 650 mg (650 mg Oral Given 04/19/16 2101)  ondansetron (ZOFRAN-ODT) disintegrating tablet 4 mg (4 mg Oral Given 04/19/16 2101)     Initial Impression / Assessment and Plan / ED Course  I have reviewed the triage vital signs and the nursing notes.  Pertinent labs & imaging results that were available during my care of the patient were reviewed by me and considered in my medical decision making (see chart for details).  Clinical Course     Patient here for evaluation of hypertension and nausea. She is in no distress in the emergency department. She had mild headache upon ED arrival that  improved after treatment. Presentation is not consistent with ACS, hypertensive urgency, CVA, SAH. Discussed with patient homecare for hypertension and nausea, outpatient follow-up and return precautions.  Final Clinical Impressions(s) / ED Diagnoses   Final diagnoses:  Nausea  Essential hypertension    New Prescriptions Discharge Medication List as of 04/19/2016 10:09 PM    START taking these medications   Details  ondansetron (ZOFRAN ODT) 4 MG disintegrating tablet Take 1 tablet (4 mg total) by mouth every 8 (eight) hours as needed for nausea or vomiting., Starting Thu 04/19/2016, Print         Quintella Reichert, MD 04/20/16 [phone]

## 2016-04-21 LAB — URINE CULTURE: Culture: <10000 — AB

## 2016-04-29 ENCOUNTER — Emergency Department (HOSPITAL_COMMUNITY): Payer: Medicare Other

## 2016-04-29 ENCOUNTER — Encounter (HOSPITAL_COMMUNITY): Payer: Self-pay | Admitting: *Deleted

## 2016-04-29 ENCOUNTER — Emergency Department (HOSPITAL_COMMUNITY)
Admission: EM | Admit: 2016-04-29 | Discharge: 2016-04-29 | Disposition: A | Discharge status: Home / Self Care | Payer: Medicare Other | Attending: Emergency Medicine | Admitting: Emergency Medicine

## 2016-04-29 DIAGNOSIS — Z794 Long term (current) use of insulin: Secondary | ICD-10-CM | POA: Diagnosis not present

## 2016-04-29 DIAGNOSIS — E119 Type 2 diabetes mellitus without complications: Secondary | ICD-10-CM | POA: Insufficient documentation

## 2016-04-29 DIAGNOSIS — Z7982 Long term (current) use of aspirin: Secondary | ICD-10-CM | POA: Diagnosis not present

## 2016-04-29 DIAGNOSIS — Z87891 Personal history of nicotine dependence: Secondary | ICD-10-CM | POA: Diagnosis not present

## 2016-04-29 DIAGNOSIS — I10 Essential (primary) hypertension: Secondary | ICD-10-CM | POA: Diagnosis present

## 2016-04-29 LAB — BASIC METABOLIC PANEL
Anion gap: 8 (ref 5–15)
BUN: 17 mg/dL (ref 6–20)
CALCIUM: 9 mg/dL (ref 8.9–10.3)
CHLORIDE: 106 mmol/L (ref 101–111)
CO2: 25 mmol/L (ref 22–32)
CREATININE: 0.89 mg/dL (ref 0.44–1.00)
GFR calc Af Amer: >60 mL/min (ref >60)
GFR calc non Af Amer: 59 mL/min — ABNORMAL LOW (ref >60)
GLUCOSE: 123 mg/dL — AB (ref 65–99)
Potassium: 3.4 mmol/L — ABNORMAL LOW (ref 3.5–5.1)
Sodium: 139 mmol/L (ref 135–145)

## 2016-04-29 LAB — CBG MONITORING, ED: Glucose-Capillary: 123 mg/dL — ABNORMAL HIGH (ref 65–99)

## 2016-04-29 LAB — I-STAT TROPONIN, ED: Troponin i, poc: 0 ng/mL (ref 0.00–0.08)

## 2016-04-29 LAB — CBC
HCT: 38.6 % (ref 36.0–46.0)
HEMOGLOBIN: 12.8 g/dL (ref 12.0–15.0)
MCH: 29 pg (ref 26.0–34.0)
MCHC: 33.2 g/dL (ref 30.0–36.0)
MCV: 87.3 fL (ref 78.0–100.0)
Platelets: 213 10*3/uL (ref 150–400)
RBC: 4.42 MIL/uL (ref 3.87–5.11)
RDW: 13.3 % (ref 11.5–15.5)
WBC: 8.1 10*3/uL (ref 4.0–10.5)

## 2016-04-29 MED ORDER — ACETAMINOPHEN 325 MG PO TABS
650.0000 mg | ORAL_TABLET | Freq: Once | ORAL | Status: AC
  Administered 2016-04-29: 650 mg via ORAL
  Filled 2016-04-29: qty 2

## 2016-04-29 MED ORDER — TRIAMTERENE-HCTZ 75-50 MG PO TABS: 1.0000 | ORAL_TABLET | Freq: Every day | ORAL | 1 refills | Status: DC

## 2016-04-29 NOTE — ED Triage Notes (Signed)
EMS reports htn, 210/110 now 175/92, c/o dizziness and tremors #18 LAC  CBG 138

## 2016-04-29 NOTE — ED Triage Notes (Signed)
Pt and granddaughter headed to cab, instructions and prescription given with verbal instructions due to this event. They verbalize understanding. No signature provided for discharge due to this.

## 2016-04-29 NOTE — ED Provider Notes (Signed)
[facility] DEPT Provider Note   CSN: [id number] Arrival date & time: 04/29/16  1244     History   Chief Complaint Chief Complaint  Patient presents with  . Hypertension  . Dizziness  . Tremors    HPI Rhonda Conrad is a 82 y.o. female.  HPI Pt presents with sx starting 3-4 days ago.  Pt has had elevated blood pressures that come and go.  She was taking it every 5 minutes and was noticing very different numbers.   She had a blood pressure up to 193.  She cant remember the other numbers because she left the paper she wrote it down on at home.  Her blood sugars have been doing OK lately but this morning she felt nervous, shaky and nauseated.  Her blood sugar was 78.   Past Medical History:  Diagnosis Date  . Anxiety   . Diabetes mellitus without complication ([employer])   . High cholesterol   . Hypertension     Patient Active Problem List   Diagnosis Date Noted  . Anxiety state, unspecified 06/14/2013  . Chest pain 06/13/2013  . DM (diabetes mellitus) ([place]) 06/13/2013  . Hypertension 06/13/2013  . Hyperlipidemia 06/13/2013    Past Surgical History:  Procedure Laterality Date  . LEFT HEART CATHETERIZATION WITH CORONARY ANGIOGRAM N/A 06/15/2013   Procedure: LEFT HEART CATHETERIZATION WITH CORONARY ANGIOGRAM;  Surgeon: Clent Demark, MD;  Location: [facility] CATH LAB;  Service: Cardiovascular;  Laterality: N/A;  . vaginal polyp removal      OB History    No data available       Home Medications    Prior to Admission medications   Medication Sig Start Date End Date Taking? Authorizing Provider  ALPRAZolam Duanne Moron) 0.5 MG tablet Take 0.5 mg by mouth 3 (three) times daily as needed for anxiety.  02/12/14  Yes Historical Provider, MD  amLODipine (NORVASC) 10 MG tablet Take 10 mg by mouth daily.   Yes Historical Provider, MD  aspirin EC 81 MG EC tablet Take 1 tablet (81 mg total) by mouth daily. 06/15/13  Yes Delfina Redwood, MD  colchicine (COLCRYS) 0.6 MG tablet Take  0.6 mg by mouth 2 (two) times daily as needed (gout).    Yes Historical Provider, MD  diclofenac sodium (VOLTAREN) 1 % GEL Apply 2 g topically 2 (two) times daily as needed (arthritic pain).   Yes Historical Provider, MD  insulin glargine (LANTUS) 100 UNIT/ML injection Inject 34 Units into the skin 2 (two) times daily.    Yes Historical Provider, MD  losartan (COZAAR) 100 MG tablet TK 1 T PO QD 02/07/16  Yes Historical Provider, MD  metoprolol (LOPRESSOR) 50 MG tablet Take 50 mg by mouth 2 (two) times daily.   Yes Historical Provider, MD  ondansetron (ZOFRAN ODT) 4 MG disintegrating tablet Take 1 tablet (4 mg total) by mouth every 8 (eight) hours as needed for nausea or vomiting. 04/19/16  Yes Quintella Reichert, MD  oxyCODONE-acetaminophen (PERCOCET) 7.5-325 MG per tablet Take 0.5-1 tablets by mouth 2 (two) times daily as needed for pain.   Yes Historical Provider, MD  pantoprazole (PROTONIX) 40 MG tablet Take 40 mg by mouth 2 (two) times daily as needed (heartburn).    Yes Historical Provider, MD  simvastatin (ZOCOR) 20 MG tablet Take 20 mg by mouth every evening.   Yes Historical Provider, MD  sitaGLIPtin-metformin (JANUMET) 50-500 MG per tablet Take 1 tablet by mouth daily.   Yes Historical Provider, MD  triamterene-hydrochlorothiazide [facility])  75-50 MG tablet Take 1 tablet by mouth daily. 04/29/16   Dorie Rank, MD    Family History No family history on file.  Social History Social History  Substance Use Topics  . Smoking status: Former Research scientist (life sciences)  . Smokeless tobacco: Never Used  . Alcohol use No     Allergies   Penicillins   Review of Systems Review of Systems  Constitutional: Negative for chills and fever.  Respiratory: Negative for cough.   Cardiovascular: Negative for chest pain.  Genitourinary: Negative for dysuria.  Neurological: Positive for tremors. Negative for seizures, speech difficulty, light-headedness, numbness and headaches.  All other systems reviewed and are  negative.    Physical Exam Updated Vital Signs BP (!) 178/103   Pulse 80   Temp 98.1 F (36.7 C) (Oral)   Resp 13   SpO2 97%   Physical Exam  Constitutional: She is oriented to person, place, and time. She appears well-developed and well-nourished. No distress.  HENT:  Head: Normocephalic and atraumatic.  Right Ear: External ear normal.  Left Ear: External ear normal.  Mouth/Throat: Oropharynx is clear and moist.  Eyes: Conjunctivae are normal. Right eye exhibits no discharge. Left eye exhibits no discharge. No scleral icterus.  Neck: Neck supple. No tracheal deviation present.  Cardiovascular: Normal rate, regular rhythm and intact distal pulses.   Pulmonary/Chest: Effort normal and breath sounds normal. No stridor. No respiratory distress. She has no wheezes. She has no rales.  Abdominal: Soft. Bowel sounds are normal. She exhibits no distension. There is no tenderness. There is no rebound and no guarding.  Musculoskeletal: She exhibits no edema or tenderness.  Neurological: She is alert and oriented to person, place, and time. She has normal strength. No cranial nerve deficit (no facial droop, extraocular movements intact, no slurred speech) or sensory deficit. She exhibits normal muscle tone. She displays no seizure activity. Coordination normal.  No pronator drift bilateral upper extrem, able to hold both legs off bed for 5 seconds, sensation intact in all extremities, no visual field cuts, no left or right sided neglect, normal finger-nose exam bilaterally, no nystagmus noted   Skin: Skin is warm and dry. No rash noted.  Psychiatric: She has a normal mood and affect.  Nursing note and vitals reviewed.    ED Treatments / Results  Labs (all labs ordered are listed, but only abnormal results are displayed) Labs Reviewed  BASIC METABOLIC PANEL - Abnormal; Notable for the following:       Result Value   Potassium 3.4 (*)    Glucose, Bld 123 (*)    GFR calc non Af Amer 59  (*)    All other components within normal limits  CBG MONITORING, ED - Abnormal; Notable for the following:    Glucose-Capillary 123 (*)    All other components within normal limits  CBC  I-STAT TROPOININ, ED    EKG  EKG Interpretation  Date/Time:  Sunday April 29 2016 13:01:56 EST Ventricular Rate:  92 PR Interval:    QRS Duration: 96 QT Interval:  381 QTC Calculation: 472 R Axis:   8 Text Interpretation:  Sinus rhythm No significant change since last tracing Confirmed by Chandra Feger  MD-J, Asja Frommer [id number]) on 04/29/2016 1:41:57 PM       Radiology Dg Chest 2 View  Result Date: 04/29/2016 CLINICAL DATA:  Pt c/o increased blood pressure today accompanied with dizziness. Pt is diabetic and has h/o controlled HTN. Former smoker. EXAM: CHEST  2 VIEW COMPARISON:  Chest x-rays  dated 01/10/2016 and 05/05/2014. FINDINGS: Mild cardiomegaly is stable. Overall cardiomediastinal silhouette is stable in size and configuration. Lungs are clear. No pleural effusion or pneumothorax seen. Osseous and soft tissue structures about the chest are unremarkable. IMPRESSION: No active cardiopulmonary disease. No evidence of pneumonia or pulmonary edema. Mild cardiomegaly is stable. Electronically Signed   By: Franki Cabot M.D.   On: 04/29/2016 14:20    Procedures Procedures (including critical care time)  Medications Ordered in ED Medications  acetaminophen (TYLENOL) tablet 650 mg (650 mg Oral Given 04/29/16 1345)     Initial Impression / Assessment and Plan / ED Course  I have reviewed the triage vital signs and the nursing notes.  Pertinent labs & imaging results that were available during my care of the patient were reviewed by me and considered in my medical decision making (see chart for details).  Clinical Course as of Apr 30 1539  Sun Apr 29, 2016  1537 Pt has remained persistently hypertensive.  Labs and exam otherwise normal.  Will increase maxzide dose.  Have her follow up with he PCP this  week  [JK]    Clinical Course User Index [JK] Dorie Rank, MD    No focal findings on neuro exam.  Doubt stroke tia.  No dehydration or anemia.   EKG with a normal rhythm.  Unclear etiology for her sx. Could be related to her anxiety.  Will increase BP meds   Final Clinical Impressions(s) / ED Diagnoses   Final diagnoses:  Essential hypertension    New Prescriptions New Prescriptions   TRIAMTERENE-HYDROCHLOROTHIAZIDE (MAXZIDE) 75-50 MG TABLET    Take 1 tablet by mouth daily.     Dorie Rank, MD 04/29/16 1540

## 2016-04-29 NOTE — ED Notes (Signed)
Bed: [id number] Expected date:  Expected time:  Means of arrival:  Comments: 82 yo HA, HTN, dizziness

## 2016-10-03 ENCOUNTER — Emergency Department (HOSPITAL_COMMUNITY): Payer: Medicare Other

## 2016-10-03 ENCOUNTER — Encounter (HOSPITAL_COMMUNITY): Payer: Self-pay | Admitting: Emergency Medicine

## 2016-10-03 ENCOUNTER — Emergency Department (HOSPITAL_COMMUNITY)
Admission: EM | Admit: 2016-10-03 | Discharge: 2016-10-04 | Disposition: A | Discharge status: Home / Self Care | Payer: Medicare Other | Attending: Emergency Medicine | Admitting: Emergency Medicine

## 2016-10-03 DIAGNOSIS — I10 Essential (primary) hypertension: Secondary | ICD-10-CM | POA: Insufficient documentation

## 2016-10-03 DIAGNOSIS — Z79899 Other long term (current) drug therapy: Secondary | ICD-10-CM | POA: Insufficient documentation

## 2016-10-03 DIAGNOSIS — Z87891 Personal history of nicotine dependence: Secondary | ICD-10-CM | POA: Diagnosis not present

## 2016-10-03 DIAGNOSIS — E119 Type 2 diabetes mellitus without complications: Secondary | ICD-10-CM | POA: Insufficient documentation

## 2016-10-03 DIAGNOSIS — M47892 Other spondylosis, cervical region: Secondary | ICD-10-CM | POA: Insufficient documentation

## 2016-10-03 DIAGNOSIS — Z955 Presence of coronary angioplasty implant and graft: Secondary | ICD-10-CM | POA: Diagnosis not present

## 2016-10-03 DIAGNOSIS — R911 Solitary pulmonary nodule: Secondary | ICD-10-CM | POA: Diagnosis not present

## 2016-10-03 DIAGNOSIS — M79602 Pain in left arm: Secondary | ICD-10-CM | POA: Insufficient documentation

## 2016-10-03 DIAGNOSIS — Z794 Long term (current) use of insulin: Secondary | ICD-10-CM | POA: Insufficient documentation

## 2016-10-03 DIAGNOSIS — R531 Weakness: Secondary | ICD-10-CM | POA: Diagnosis present

## 2016-10-03 DIAGNOSIS — M79601 Pain in right arm: Secondary | ICD-10-CM

## 2016-10-03 DIAGNOSIS — M47812 Spondylosis without myelopathy or radiculopathy, cervical region: Secondary | ICD-10-CM

## 2016-10-03 DIAGNOSIS — R0600 Dyspnea, unspecified: Secondary | ICD-10-CM | POA: Insufficient documentation

## 2016-10-03 LAB — COMPREHENSIVE METABOLIC PANEL
ALT: 16 U/L (ref 14–54)
AST: 21 U/L (ref 15–41)
Albumin: 3.7 g/dL (ref 3.5–5.0)
Alkaline Phosphatase: 65 U/L (ref 38–126)
Anion gap: 7 (ref 5–15)
BUN: 24 mg/dL — AB (ref 6–20)
CHLORIDE: 106 mmol/L (ref 101–111)
CO2: 25 mmol/L (ref 22–32)
CREATININE: 1.27 mg/dL — AB (ref 0.44–1.00)
Calcium: 9.1 mg/dL (ref 8.9–10.3)
GFR calc Af Amer: 44 mL/min — ABNORMAL LOW (ref >60)
GFR, EST NON AFRICAN AMERICAN: 38 mL/min — AB (ref >60)
Glucose, Bld: 175 mg/dL — ABNORMAL HIGH (ref 65–99)
Potassium: 3.8 mmol/L (ref 3.5–5.1)
Sodium: 138 mmol/L (ref 135–145)
Total Bilirubin: 0.3 mg/dL (ref 0.3–1.2)
Total Protein: 7.5 g/dL (ref 6.5–8.1)

## 2016-10-03 LAB — CBG MONITORING, ED: Glucose-Capillary: 172 mg/dL — ABNORMAL HIGH (ref 65–99)

## 2016-10-03 LAB — URINALYSIS, ROUTINE W REFLEX MICROSCOPIC
BACTERIA UA: NONE SEEN
Bilirubin Urine: NEGATIVE
Glucose, UA: NEGATIVE mg/dL
Hgb urine dipstick: NEGATIVE
Ketones, ur: NEGATIVE mg/dL
Nitrite: NEGATIVE
PH: 5 (ref 5.0–8.0)
Protein, ur: NEGATIVE mg/dL
SPECIFIC GRAVITY, URINE: 1.006 (ref 1.005–1.030)

## 2016-10-03 LAB — I-STAT TROPONIN, ED: Troponin i, poc: 0 ng/mL (ref 0.00–0.08)

## 2016-10-03 LAB — CBC WITH DIFFERENTIAL/PLATELET
BASOS PCT: 0 %
Basophils Absolute: 0 10*3/uL (ref 0.0–0.1)
EOS PCT: 2 %
Eosinophils Absolute: 0.2 10*3/uL (ref 0.0–0.7)
HCT: 37.1 % (ref 36.0–46.0)
HEMOGLOBIN: 12.5 g/dL (ref 12.0–15.0)
Lymphocytes Relative: 36 %
Lymphs Abs: 3.6 10*3/uL (ref 0.7–4.0)
MCH: 29.5 pg (ref 26.0–34.0)
MCHC: 33.7 g/dL (ref 30.0–36.0)
MCV: 87.5 fL (ref 78.0–100.0)
MONOS PCT: 8 %
Monocytes Absolute: 0.8 10*3/uL (ref 0.1–1.0)
NEUTROS PCT: 54 %
Neutro Abs: 5.5 10*3/uL (ref 1.7–7.7)
PLATELETS: 247 10*3/uL (ref 150–400)
RBC: 4.24 MIL/uL (ref 3.87–5.11)
RDW: 13.4 % (ref 11.5–15.5)
WBC: 10.1 10*3/uL (ref 4.0–10.5)

## 2016-10-03 LAB — LIPASE, BLOOD: Lipase: 22 U/L (ref 11–51)

## 2016-10-03 LAB — CK: Total CK: 64 U/L (ref 38–234)

## 2016-10-03 LAB — BRAIN NATRIURETIC PEPTIDE: B NATRIURETIC PEPTIDE 5: 10.4 pg/mL (ref 0.0–100.0)

## 2016-10-03 MED ORDER — SODIUM CHLORIDE 0.9 % IV BOLUS (SEPSIS)
500.0000 mL | Freq: Once | INTRAVENOUS | Status: AC
  Administered 2016-10-03: 500 mL via INTRAVENOUS

## 2016-10-03 NOTE — ED Notes (Signed)
Bed: WA09 Expected date:  Expected time:  Means of arrival:  Comments: 83 yr old, weakness

## 2016-10-03 NOTE — ED Notes (Signed)
Patient transported to CT 

## 2016-10-03 NOTE — ED Triage Notes (Signed)
Per EMS report, pt coming from home complaining of generalized weakness and nausea that has been present for the last several days. Pt states she's just hasn't felt well.   200/94- pt reports taking bp meds as ordered HR86 96% RA CBG 185- Hx diabetes

## 2016-10-03 NOTE — ED Provider Notes (Signed)
[facility] DEPT Provider Note   CSN: [id number] Arrival date & time: 10/03/16  2127   By signing my name below, I, Neta Mends, attest that this documentation has been prepared under the direction and in the presence of [facility], PA-C. Electronically Signed: Neta Mends, ED Scribe. 10/03/2016. 12:34 AM.   History   Chief Complaint Chief Complaint  Patient presents with  . Weakness   The history is provided by the patient and medical records. No language interpreter was used.   HPI Comments:  Rhonda Conrad is a 83 y.o. female with PMHx of DM and HTN who presents to the Emergency Department complaining of ongoing general weakness x ~1 week. Pt reports that 1 week ago she attempted to move a bed by herself, and the next night, both of her arms became swollen and painful, and have been painful since. She states that she has become generally increasingly weak since. Pt complains of associated generalized body aches, nausea, neck pain, bilateral lower leg swelling, and headaches. Pt had cardiac catheterization 1 year ago with no problems. No alleviating factors noted. Denies numbness, chest pain, SOB, cough, fever, diarrhea, visual changes.   Past Medical History:  Diagnosis Date  . Anxiety   . Diabetes mellitus without complication ([place])   . High cholesterol   . Hypertension     Patient Active Problem List   Diagnosis Date Noted  . Anxiety state, unspecified 06/14/2013  . Chest pain 06/13/2013  . DM (diabetes mellitus) ([place]) 06/13/2013  . Hypertension 06/13/2013  . Hyperlipidemia 06/13/2013    Past Surgical History:  Procedure Laterality Date  . LEFT HEART CATHETERIZATION WITH CORONARY ANGIOGRAM N/A 06/15/2013   Procedure: LEFT HEART CATHETERIZATION WITH CORONARY ANGIOGRAM;  Surgeon: Clent Demark, MD;  Location: [facility] CATH LAB;  Service: Cardiovascular;  Laterality: N/A;  . vaginal polyp removal      OB History    No data available        Home Medications    Prior to Admission medications   Medication Sig Start Date End Date Taking? Authorizing Provider  acetaminophen (TYLENOL) 500 MG tablet Take 500 mg by mouth every 6 (six) hours as needed for mild pain.   Yes Historical Provider, MD  ALPRAZolam Duanne Moron) 0.5 MG tablet Take 0.5 mg by mouth 3 (three) times daily as needed for anxiety.  02/12/14  Yes Historical Provider, MD  amLODipine (NORVASC) 10 MG tablet Take 10 mg by mouth daily.   Yes Historical Provider, MD  aspirin EC 81 MG EC tablet Take 1 tablet (81 mg total) by mouth daily. 06/15/13  Yes Delfina Redwood, MD  colchicine (COLCRYS) 0.6 MG tablet Take 0.6 mg by mouth 2 (two) times daily as needed (gout).    Yes Historical Provider, MD  diclofenac sodium (VOLTAREN) 1 % GEL Apply 2 g topically 2 (two) times daily as needed (arthritic pain).   Yes Historical Provider, MD  insulin glargine (LANTUS) 100 UNIT/ML injection Inject 34 Units into the skin 2 (two) times daily.    Yes Historical Provider, MD  losartan (COZAAR) 100 MG tablet Take 1 tablet Daily 02/07/16  Yes Historical Provider, MD  metoprolol (LOPRESSOR) 50 MG tablet Take 50 mg by mouth 2 (two) times daily.   Yes Historical Provider, MD  ondansetron (ZOFRAN ODT) 4 MG disintegrating tablet Take 1 tablet (4 mg total) by mouth every 8 (eight) hours as needed for nausea or vomiting. 04/19/16  Yes Quintella Reichert, MD  oxyCODONE-acetaminophen (PERCOCET) 7.5-325 MG  per tablet Take 0.5-1 tablets by mouth 2 (two) times daily as needed for pain.   Yes Historical Provider, MD  pantoprazole (PROTONIX) 40 MG tablet Take 40 mg by mouth 2 (two) times daily as needed (heartburn).    Yes Historical Provider, MD  simvastatin (ZOCOR) 20 MG tablet Take 20 mg by mouth every evening.   Yes Historical Provider, MD  sitaGLIPtin-metformin (JANUMET) 50-500 MG per tablet Take 1 tablet by mouth daily.   Yes Historical Provider, MD  triamterene-hydrochlorothiazide (MAXZIDE) 75-50 MG tablet Take  1 tablet by mouth daily. 04/29/16  Yes Dorie Rank, MD    Family History No family history on file.  Social History Social History  Substance Use Topics  . Smoking status: Former Research scientist (life sciences)  . Smokeless tobacco: Never Used  . Alcohol use No     Allergies   Penicillins   Review of Systems Review of Systems  Constitutional: Negative for fever.  Eyes: Negative for visual disturbance.  Respiratory: Negative for cough and shortness of breath.   Cardiovascular: Positive for leg swelling. Negative for chest pain.  Gastrointestinal: Positive for nausea. Negative for diarrhea and vomiting.  Musculoskeletal: Positive for myalgias and neck pain.  Neurological: Positive for weakness and headaches. Negative for numbness.  All other systems reviewed and are negative.    Physical Exam Updated Vital Signs BP (!) 170/83 (BP Location: Right Arm)   Pulse 78   Temp 98.5 F (36.9 C) (Oral)   Resp 14   SpO2 98%   Physical Exam  Constitutional: She is oriented to person, place, and time. She appears well-developed and well-nourished. No distress.  HENT:  Head: Normocephalic and atraumatic.  Mouth/Throat: Oropharynx is clear and moist.  Eyes: Conjunctivae and EOM are normal. Pupils are equal, round, and reactive to light. No scleral icterus.  No horizontal, vertical or rotational nystagmus  Neck: Normal range of motion. Neck supple. Muscular tenderness present. No spinous process tenderness present.  Full active and passive ROM without pain No midline or paraspinal tenderness No nuchal rigidity or meningeal signs  Cardiovascular: Normal rate, regular rhythm and intact distal pulses.   Pulmonary/Chest: Effort normal and breath sounds normal. No respiratory distress. She has no wheezes. She has no rales.  Abdominal: Soft. Bowel sounds are normal. There is no tenderness. There is no rebound and no guarding.  Musculoskeletal: Normal range of motion.  No peripheral edema   Lymphadenopathy:     She has no cervical adenopathy.  Neurological: She is alert and oriented to person, place, and time. No cranial nerve deficit. She exhibits normal muscle tone. Coordination normal.  Mental Status:  Alert, oriented, thought content appropriate. Speech fluent without evidence of aphasia. Able to follow 2 step commands without difficulty.  Cranial Nerves:  II:  Peripheral visual fields grossly normal, pupils equal, round, reactive to light III,IV, VI: ptosis not present, extra-ocular motions intact bilaterally  V,VII: smile symmetric, facial light touch sensation equal VIII: hearing grossly normal bilaterally  IX,X: midline uvula rise  XI: bilateral shoulder shrug equal and strong XII: midline tongue extension  Motor:  5/5 in upper and lower extremities bilaterally including strong and equal grip strength and dorsiflexion/plantar flexion Sensory: Pinprick and light touch normal in all extremities.  Cerebellar: normal finger-to-nose with bilateral upper extremities Gait: normal gait and balance CV: distal pulses palpable throughout  No Clonus  Skin: Skin is warm and dry. No rash noted. She is not diaphoretic.  Psychiatric: She has a normal mood and affect. Her behavior is  normal. Judgment and thought content normal.  Nursing note and vitals reviewed.    ED Treatments / Results  DIAGNOSTIC STUDIES:  Oxygen Saturation is 98% on RA, normal by my interpretation.    COORDINATION OF CARE:  10:31 PM Discussed treatment plan with pt at bedside and pt agreed to plan.   Labs (all labs ordered are listed, but only abnormal results are displayed) Labs Reviewed  URINALYSIS, ROUTINE W REFLEX MICROSCOPIC - Abnormal; Notable for the following:       Result Value   Color, Urine STRAW (*)    Leukocytes, UA TRACE (*)    Squamous Epithelial / LPF 0-5 (*)    All other components within normal limits  COMPREHENSIVE METABOLIC PANEL - Abnormal; Notable for the following:    Glucose, Bld 175 (*)     BUN 24 (*)    Creatinine, Ser 1.27 (*)    GFR calc non Af Amer 38 (*)    GFR calc Af Amer 44 (*)    All other components within normal limits  CBG MONITORING, ED - Abnormal; Notable for the following:    Glucose-Capillary 172 (*)    All other components within normal limits  URINE CULTURE  CBC WITH DIFFERENTIAL/PLATELET  LIPASE, BLOOD  BRAIN NATRIURETIC PEPTIDE  CK  I-STAT TROPOININ, ED    EKG  EKG Interpretation  Date/Time:  Wednesday Oct 03 2016 23:23:54 EDT Ventricular Rate:  82 PR Interval:    QRS Duration: 94 QT Interval:  360 QTC Calculation: 421 R Axis:   6 Text Interpretation:  Sinus rhythm Consider left atrial enlargement No significant change since last tracing Confirmed by Winfred Leeds  MD, SAM [phone]) on 10/03/2016 11:31:51 PM       Radiology Dg Chest 2 View  Result Date: 10/03/2016 CLINICAL DATA:  Dyspnea, body ache for 1 week EXAM: CHEST  2 VIEW COMPARISON:  None. FINDINGS: The heart size and mediastinal contours are within normal limits. Slight uncoiling of the thoracic aorta with atherosclerosis. No aneurysm. Both lungs are clear. The visualized skeletal structures are unremarkable. IMPRESSION: No active cardiopulmonary disease. Electronically Signed   By: Ashley Royalty M.D.   On: 10/03/2016 23:46   Ct Head Wo Contrast  Result Date: 10/03/2016 CLINICAL DATA:  Weakness and hypertension x1 week. EXAM: CT HEAD WITHOUT CONTRAST CT CERVICAL SPINE WITHOUT CONTRAST TECHNIQUE: Multidetector CT imaging of the head and cervical spine was performed following the standard protocol without intravenous contrast. Multiplanar CT image reconstructions of the cervical spine were also generated. COMPARISON:  None. FINDINGS: CT HEAD FINDINGS Brain: Mild age related involutional changes the brain. Minimal small vessel ischemic disease of periventricular white matter. No large vascular territory infarction, hemorrhage or midline shift. No intra-axial mass nor extra-axial fluid collections. No  hydrocephalus. Basal cisterns and fourth ventricle are midline. Vascular: No hyperdense vessels. Mild a carotid siphon calcifications bilaterally. Skull: No acute fracture or suspicious osseous lesions. Sinuses/Orbits: Intact sinuses and orbits. No significant sinus disease. Clear mastoids. Other: None CT CERVICAL SPINE FINDINGS Alignment: Reversal cervical lordosis which may be due to positioning, muscle spasm or degenerative disc disease. Skull base and vertebrae: The craniocervical relationship is maintained. There is osteoarthritic joint space narrowing of the atlantodental interval with minimal spurring. Erosive changes are noted of the odontoid. No vertebral body fracture. Soft tissues and spinal canal: No prevertebral soft tissue swelling. No visible canal hematoma. Disc levels: Cervical spondylitic disc space narrowing from C4 through C7 with associated uncovertebral joint osteoarthritis and mild bilateral neural foraminal  encroachment. The facet joints are maintained without jumped appearing facets. Upper chest: 3 mm in the right upper lobe nodular density is seen, series 9 image 69. Other: None IMPRESSION: 1. No acute intracranial abnormality. Minimal small vessel ischemic disease of periventricular white matter. 2. Cervical spondylosis with disc space narrowing from C4 through C7. Associated uncovertebral joint osteoarthritis with associated bilateral neural foraminal encroachment. 3. 3 mm right upper lobe pulmonary nodule. Dedicated chest CT may determine if there are other nodules that may alter followup recommendations. Based on this pulmonary nodule, no follow-up needed if patient is low-risk. Non-contrast chest CT can be considered in 12 months if patient is high-risk. This recommendation follows the consensus statement: Guidelines for Management of Incidental Pulmonary Nodules Detected on CT Images: From the Fleischner Society 2017; Radiology 2017; 284:228-243. Electronically Signed   By: Ashley Royalty M.D.   On: 10/03/2016 23:31   Ct Cervical Spine Wo Contrast  Result Date: 10/03/2016 CLINICAL DATA:  Weakness and hypertension x1 week. EXAM: CT HEAD WITHOUT CONTRAST CT CERVICAL SPINE WITHOUT CONTRAST TECHNIQUE: Multidetector CT imaging of the head and cervical spine was performed following the standard protocol without intravenous contrast. Multiplanar CT image reconstructions of the cervical spine were also generated. COMPARISON:  None. FINDINGS: CT HEAD FINDINGS Brain: Mild age related involutional changes the brain. Minimal small vessel ischemic disease of periventricular white matter. No large vascular territory infarction, hemorrhage or midline shift. No intra-axial mass nor extra-axial fluid collections. No hydrocephalus. Basal cisterns and fourth ventricle are midline. Vascular: No hyperdense vessels. Mild a carotid siphon calcifications bilaterally. Skull: No acute fracture or suspicious osseous lesions. Sinuses/Orbits: Intact sinuses and orbits. No significant sinus disease. Clear mastoids. Other: None CT CERVICAL SPINE FINDINGS Alignment: Reversal cervical lordosis which may be due to positioning, muscle spasm or degenerative disc disease. Skull base and vertebrae: The craniocervical relationship is maintained. There is osteoarthritic joint space narrowing of the atlantodental interval with minimal spurring. Erosive changes are noted of the odontoid. No vertebral body fracture. Soft tissues and spinal canal: No prevertebral soft tissue swelling. No visible canal hematoma. Disc levels: Cervical spondylitic disc space narrowing from C4 through C7 with associated uncovertebral joint osteoarthritis and mild bilateral neural foraminal encroachment. The facet joints are maintained without jumped appearing facets. Upper chest: 3 mm in the right upper lobe nodular density is seen, series 9 image 69. Other: None IMPRESSION: 1. No acute intracranial abnormality. Minimal small vessel ischemic disease of  periventricular white matter. 2. Cervical spondylosis with disc space narrowing from C4 through C7. Associated uncovertebral joint osteoarthritis with associated bilateral neural foraminal encroachment. 3. 3 mm right upper lobe pulmonary nodule. Dedicated chest CT may determine if there are other nodules that may alter followup recommendations. Based on this pulmonary nodule, no follow-up needed if patient is low-risk. Non-contrast chest CT can be considered in 12 months if patient is high-risk. This recommendation follows the consensus statement: Guidelines for Management of Incidental Pulmonary Nodules Detected on CT Images: From the Fleischner Society 2017; Radiology 2017; 284:228-243. Electronically Signed   By: Ashley Royalty M.D.   On: 10/03/2016 23:31    Procedures Procedures (including critical care time)  Medications Ordered in ED Medications  acetaminophen (TYLENOL) tablet 1,000 mg (not administered)  sodium chloride 0.9 % bolus 500 mL (500 mLs Intravenous New Bag/Given 10/03/16 2241)     Initial Impression / Assessment and Plan / ED Course  I have reviewed the triage vital signs and the nursing notes.  Pertinent labs &  imaging results that were available during my care of the patient were reviewed by me and considered in my medical decision making (see chart for details).     Pt presents with BUE pain after Moving a bed.  No peripheral edema. Lab work with elevation and BUN and creatinine, just slightly above baseline. Patient given small fluid bolus.  No elevation of CK.  Doubt rhabdomyolysis.  No evidence of ACS.   CT without acute abnormality of the cervical spine.  Of note, pulmonary nodule noted.  Discussed with pt who will follow-up with PCP.  Normal neuro exam.  Doubt transverse myelitis.   Pt well appearing and ambulatory without assistance.    The patient was discussed with and seen by Dr. Laverta Baltimore who agrees with the treatment plan.   Final Clinical Impressions(s) / ED Diagnoses     Final diagnoses:  Pain in both upper extremities  Cervical arthritis (HCC)  Pulmonary nodule    New Prescriptions New Prescriptions   No medications on file    I personally performed the services described in this documentation, which was scribed in my presence. The recorded information has been reviewed and is accurate.     Jarrett Soho Cadince Hilscher, PA-C 10/04/16 [place], MD 10/04/16 1426

## 2016-10-04 MED ORDER — ACETAMINOPHEN 500 MG PO TABS
1000.0000 mg | ORAL_TABLET | Freq: Once | ORAL | Status: AC
  Administered 2016-10-04: 1000 mg via ORAL
  Filled 2016-10-04: qty 2

## 2016-10-04 NOTE — Discharge Instructions (Signed)
1. Medications: Tylenol for pain control, usual home medications 2. Treatment: rest, drink plenty of fluids, gentle stretching 3. Follow Up: Please followup with your primary doctor in 2-3 days for discussion of your diagnoses and further evaluation after today's visit; Please also follow-up for a discussion about your pulmonary nodule; Please return to the ER for worsening symptoms, SOB, chest pain or other concerns.

## 2016-10-05 LAB — URINE CULTURE

## 2016-10-09 ENCOUNTER — Other Ambulatory Visit: Payer: Self-pay | Admitting: Family Medicine

## 2016-10-09 DIAGNOSIS — R911 Solitary pulmonary nodule: Secondary | ICD-10-CM

## 2016-11-12 ENCOUNTER — Ambulatory Visit
Admission: RE | Admit: 2016-11-12 | Discharge: 2016-11-12 | Disposition: A | Discharge status: Home / Self Care | Payer: Medicare Other | Source: Ambulatory Visit | Attending: Family Medicine | Admitting: Family Medicine

## 2016-11-12 DIAGNOSIS — R911 Solitary pulmonary nodule: Secondary | ICD-10-CM

## 2016-11-13 ENCOUNTER — Other Ambulatory Visit (HOSPITAL_COMMUNITY): Payer: Self-pay | Admitting: Family Medicine

## 2016-11-13 DIAGNOSIS — R911 Solitary pulmonary nodule: Secondary | ICD-10-CM

## 2016-11-15 ENCOUNTER — Other Ambulatory Visit: Payer: Self-pay | Admitting: Family Medicine

## 2016-11-15 DIAGNOSIS — N63 Unspecified lump in unspecified breast: Secondary | ICD-10-CM

## 2016-11-15 DIAGNOSIS — E041 Nontoxic single thyroid nodule: Secondary | ICD-10-CM

## 2016-11-19 ENCOUNTER — Ambulatory Visit
Admission: RE | Admit: 2016-11-19 | Discharge: 2016-11-19 | Disposition: A | Discharge status: Home / Self Care | Payer: Medicare Other | Source: Ambulatory Visit | Attending: Family Medicine | Admitting: Family Medicine

## 2016-11-19 DIAGNOSIS — E041 Nontoxic single thyroid nodule: Secondary | ICD-10-CM

## 2016-11-23 ENCOUNTER — Encounter (HOSPITAL_COMMUNITY)
Admission: RE | Admit: 2016-11-23 | Discharge: 2016-11-23 | Disposition: A | Discharge status: Home / Self Care | Payer: Medicare Other | Source: Ambulatory Visit | Attending: Family Medicine | Admitting: Family Medicine

## 2016-11-23 DIAGNOSIS — N6320 Unspecified lump in the left breast, unspecified quadrant: Secondary | ICD-10-CM | POA: Diagnosis not present

## 2016-11-23 DIAGNOSIS — R911 Solitary pulmonary nodule: Secondary | ICD-10-CM

## 2016-11-23 DIAGNOSIS — R918 Other nonspecific abnormal finding of lung field: Secondary | ICD-10-CM | POA: Insufficient documentation

## 2016-11-23 DIAGNOSIS — I700 Atherosclerosis of aorta: Secondary | ICD-10-CM | POA: Diagnosis not present

## 2016-11-23 LAB — GLUCOSE, CAPILLARY: Glucose-Capillary: 119 mg/dL — ABNORMAL HIGH (ref 65–99)

## 2016-11-23 IMAGING — PT NM PET TUM IMG INITIAL (PI) SKULL BASE T - THIGH
1 series · 2 of 2 positions shown · non-contrast
Comparison: Chest CT of 11/12/2016.

CLINICAL DATA: Initial treatment strategy for pulmonary nodule.
Diabetes. Hypertension..

EXAM:
NUCLEAR MEDICINE PET SKULL BASE TO THIGH
TECHNIQUE: One hundred nineteen MCi F-18 FDG was injected intravenously.
Full-ring PET imaging was performed from the skull base to thigh
after the radiotracer. CT data was obtained and used for attenuation
correction and anatomic localization.
FASTING BLOOD GLUCOSE:  Value: 9.3 mg/dl

[Series 1032: results mm oncology reading · 4.0mm · 0.89mm/px · 2 of 2 slices shown]
[im 1/2]
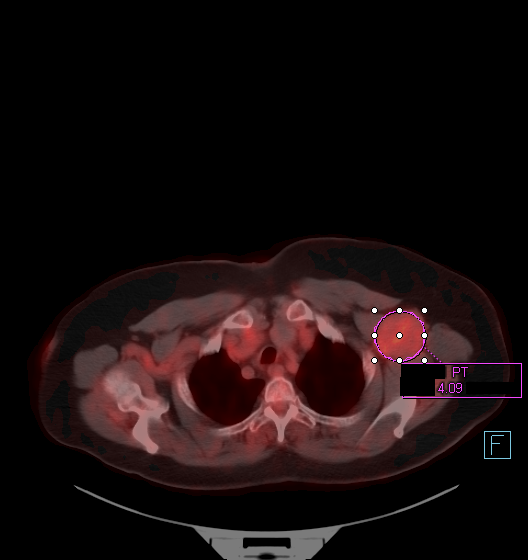
[im 2/2]
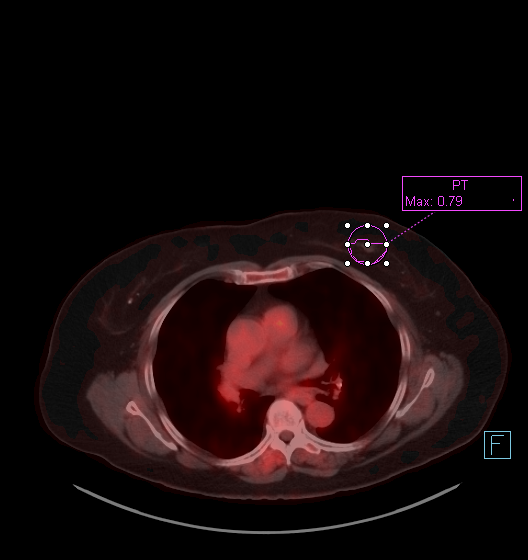

[2 of 2 positions shown; findings below may reference images not displayed]

FINDINGS: NECK

No areas of abnormal hypermetabolism. Small nodules in both parotid
glands are likely lymph nodes. No cervical adenopathy.

CHEST

Left axillary soft tissue mass is likely nodal. This measures 5.1 x
4.2 cm and a S.U.V. max of 4.1 on image 38/ series 2. No other nodal
hypermetabolism identified.

The left breast nodule described on the prior diagnostic CT measures
9 x 10 mm and is not significantly hypermetabolic, including on
image 57/series 4. Small bilateral pulmonary nodules are again
identified. The majority are below PET resolution, but similar in
size. Example at 9 mm in the left upper lobe on image 15/series 11.
Right middle lobe ground-glass opacities are not well evaluated. The
larger is likely similar including on image 29/ series 8.

A lingular nodule is similar to decreased in size at 8 mm on image
39/ series 8. Compare 11 mm on the prior. No correlate
hypermetabolism.

ABDOMEN/PELVIS

No abdominopelvic parenchymal or nodal hypermetabolism. Mild left
adrenal thickening. Abdominal aortic atherosclerosis. A central
subcentimeter right hepatic lobe low-density lesion is unchanged.

SKELETON

No abnormal marrow activity. No focal osseous lesion.
IMPRESSION: 1. Mildly hypermetabolic left axillary mass is suspicious for
localized adenopathy. Favor lymphoma.
2. The left breast nodule described on prior diagnostic CT is not
significantly hypermetabolic.
3. Small bilateral pulmonary nodules are primarily felt to be
similar. The majority are below PET resolution. Therefore, lack of
hypermetabolism is nonspecific. Consider CT followup at 3 months. If
there are infectious symptoms, patient could be treated with
antibiotics and shorter term follow-up CT performed.
4.  Aortic Atherosclerosis ([id number]).

## 2016-11-23 MED ORDER — FLUDEOXYGLUCOSE F - 18 (FDG) INJECTION
9.3000 | Freq: Once | INTRAVENOUS | Status: AC | PRN
  Administered 2016-11-23: 9.3 via INTRAVENOUS

## 2016-11-26 ENCOUNTER — Other Ambulatory Visit: Payer: Self-pay | Admitting: Family Medicine

## 2016-11-26 DIAGNOSIS — N63 Unspecified lump in unspecified breast: Secondary | ICD-10-CM

## 2016-11-30 ENCOUNTER — Emergency Department (HOSPITAL_COMMUNITY)
Admission: EM | Admit: 2016-11-30 | Discharge: 2016-12-01 | Disposition: A | Discharge status: Home / Self Care | Payer: Medicare Other | Attending: Emergency Medicine | Admitting: Emergency Medicine

## 2016-11-30 DIAGNOSIS — E7800 Pure hypercholesterolemia, unspecified: Secondary | ICD-10-CM | POA: Diagnosis not present

## 2016-11-30 DIAGNOSIS — E785 Hyperlipidemia, unspecified: Secondary | ICD-10-CM | POA: Diagnosis not present

## 2016-11-30 DIAGNOSIS — E119 Type 2 diabetes mellitus without complications: Secondary | ICD-10-CM | POA: Insufficient documentation

## 2016-11-30 DIAGNOSIS — R079 Chest pain, unspecified: Secondary | ICD-10-CM | POA: Insufficient documentation

## 2016-11-30 DIAGNOSIS — Z79899 Other long term (current) drug therapy: Secondary | ICD-10-CM | POA: Diagnosis not present

## 2016-11-30 DIAGNOSIS — Z7982 Long term (current) use of aspirin: Secondary | ICD-10-CM | POA: Diagnosis not present

## 2016-11-30 DIAGNOSIS — I10 Essential (primary) hypertension: Secondary | ICD-10-CM | POA: Insufficient documentation

## 2016-11-30 NOTE — ED Provider Notes (Signed)
[facility] DEPT Provider Note   CSN: [id number] Arrival date & time: 11/30/16  2350  By signing my name below, I, Evelene Croon, attest that this documentation has been prepared under the direction and in the presence of Aubrea Meixner, Annie Main, MD . Electronically Signed: Evelene Croon, Scribe. 12/01/2016. 12:04 AM.  History   Chief Complaint Chief Complaint  Patient presents with  . Chest Pain    The history is provided by the patient. No language interpreter was used.    HPI Comments:  Rhonda Conrad is a 83 y.o. female with a history of HTN, HLD, and DM, who presents to the Emergency Department via EMS from home complaining of non-radiating, central CP that began this evening while in bed. She describes the pain as a pressure. Pt was in bed with a baby on her chest when the house next door was raided by [employer] and police prior to symptom onset. She was given ASA and NTG en route with relief. Pt states she feels fine at this time. She denies SOB, back pain,  nausea, and vomiting. No h/o similar pain. No h/o MI.   [facility]  Past Medical History:  Diagnosis Date  . Anxiety   . Diabetes mellitus without complication ([place])   . High cholesterol   . Hypertension     Patient Active Problem List   Diagnosis Date Noted  . Anxiety state, unspecified 06/14/2013  . Chest pain 06/13/2013  . DM (diabetes mellitus) ([place]) 06/13/2013  . Hypertension 06/13/2013  . Hyperlipidemia 06/13/2013    Past Surgical History:  Procedure Laterality Date  . LEFT HEART CATHETERIZATION WITH CORONARY ANGIOGRAM N/A 06/15/2013   Procedure: LEFT HEART CATHETERIZATION WITH CORONARY ANGIOGRAM;  Surgeon: Clent Demark, MD;  Location: [facility] CATH LAB;  Service: Cardiovascular;  Laterality: N/A;  . vaginal polyp removal      OB History    No data available       Home Medications    Prior to Admission medications   Medication Sig Start Date End Date Taking? Authorizing Provider  acetaminophen  (TYLENOL) 500 MG tablet Take 500 mg by mouth every 6 (six) hours as needed for mild pain.    [provider]  ALPRAZolam Duanne Moron) 0.5 MG tablet Take 0.5 mg by mouth 3 (three) times daily as needed for anxiety.  02/12/14   [provider]  amLODipine (NORVASC) 10 MG tablet Take 10 mg by mouth daily.    [provider]  aspirin EC 81 MG EC tablet Take 1 tablet (81 mg total) by mouth daily. 06/15/13   Delfina Redwood, MD  colchicine (COLCRYS) 0.6 MG tablet Take 0.6 mg by mouth 2 (two) times daily as needed (gout).     [provider]  diclofenac sodium (VOLTAREN) 1 % GEL Apply 2 g topically 2 (two) times daily as needed (arthritic pain).    [provider]  insulin glargine (LANTUS) 100 UNIT/ML injection Inject 34 Units into the skin 2 (two) times daily.     [provider]  losartan (COZAAR) 100 MG tablet Take 1 tablet Daily 02/07/16   [provider]  metoprolol (LOPRESSOR) 50 MG tablet Take 50 mg by mouth 2 (two) times daily.    [provider]  ondansetron (ZOFRAN ODT) 4 MG disintegrating tablet Take 1 tablet (4 mg total) by mouth every 8 (eight) hours as needed for nausea or vomiting. 04/19/16   Quintella Reichert, MD  oxyCODONE-acetaminophen (PERCOCET) 7.5-325 MG per tablet Take 0.5-1 tablets by mouth  2 (two) times daily as needed for pain.    [provider]  pantoprazole (PROTONIX) 40 MG tablet Take 40 mg by mouth 2 (two) times daily as needed (heartburn).     [provider]  simvastatin (ZOCOR) 20 MG tablet Take 20 mg by mouth every evening.    [provider]  sitaGLIPtin-metformin (JANUMET) 50-500 MG per tablet Take 1 tablet by mouth daily.    [provider]  triamterene-hydrochlorothiazide (MAXZIDE) 75-50 MG tablet Take 1 tablet by mouth daily. 04/29/16   Dorie Rank, MD    Family History No family history on file.  Social History Social History  Substance Use Topics  . Smoking  status: Former Research scientist (life sciences)  . Smokeless tobacco: Never Used  . Alcohol use No     Allergies   Penicillins   Review of Systems Review of Systems  All systems reviewed and are negative for acute change except as noted in the HPI.   Physical Exam Updated Vital Signs There were no vitals taken for this visit.  Physical Exam  Constitutional: She is oriented to person, place, and time. She appears well-developed and well-nourished. No distress.  HENT:  Head: Normocephalic and atraumatic.  Mouth/Throat: Oropharynx is clear and moist. No oropharyngeal exudate.  Eyes: Conjunctivae and EOM are normal. Pupils are equal, round, and reactive to light.  Neck: Normal range of motion. Neck supple.  No meningismus.  Cardiovascular: Normal rate, regular rhythm, normal heart sounds and intact distal pulses.   No murmur heard. Pulmonary/Chest: Effort normal and breath sounds normal. No respiratory distress. She exhibits no tenderness.  Abdominal: Soft. There is no tenderness. There is no rebound and no guarding.  Musculoskeletal: Normal range of motion. She exhibits no edema or tenderness.  Neurological: She is alert and oriented to person, place, and time. No cranial nerve deficit. She exhibits normal muscle tone. Coordination normal.  No ataxia on finger to nose bilaterally. No pronator drift. 5/5 strength throughout. CN 2-12 intact.Equal grip strength. Sensation intact.   Skin: Skin is warm.  Psychiatric: She has a normal mood and affect. Her behavior is normal.  Nursing note and vitals reviewed.    ED Treatments / Results  DIAGNOSTIC STUDIES:  Oxygen Saturation is 100% on RA, normal by my interpretation.    COORDINATION OF CARE:  12:03 AM Discussed treatment plan with pt at bedside and pt agreed to plan.  Labs (all labs ordered are listed, but only abnormal results are displayed) Labs Reviewed  COMPREHENSIVE METABOLIC PANEL - Abnormal; Notable for the following:       Result Value    Potassium 6.2 (*)    CO2 19 (*)    Glucose, Bld 150 (*)    Creatinine, Ser 1.12 (*)    Calcium 8.7 (*)    AST 63 (*)    Total Bilirubin 1.3 (*)    GFR calc non Af Amer 44 (*)    GFR calc Af Amer 51 (*)    All other components within normal limits  CBC WITH DIFFERENTIAL/PLATELET - Abnormal; Notable for the following:    WBC 12.5 (*)    Lymphs Abs 5.6 (*)    All other components within normal limits  LIPASE, BLOOD  TROPONIN I  POTASSIUM  TROPONIN I    EKG  EKG Interpretation  Date/Time:  Friday November 30 2016 23:57:40 EDT Ventricular Rate:  96 PR Interval:    QRS Duration: 93 QT Interval:  357 QTC Calculation: 452 R Axis:   -14  Text Interpretation:  Sinus rhythm No significant change was found Confirmed by Ezequiel Essex [id number]) on 12/01/2016 12:11:10 AM       Radiology Dg Chest 2 View  Result Date: 12/01/2016 CLINICAL DATA:  Central chest pain and hypertension tonight EXAM: CHEST  2 VIEW COMPARISON:  10/03/2016, 11/12/2016. FINDINGS: Left upper lobe nodule appears unchanged from 11/23/2016. No consolidation. No effusion. Unremarkable hilar and mediastinal contours. Normal heart size. IMPRESSION: Known pulmonary nodules. No consolidation or effusion. Normal vasculature. Electronically Signed   By: Andreas Newport M.D.   On: 12/01/2016 01:02    Procedures Procedures (including critical care time)  Medications Ordered in ED Medications - No data to display   Initial Impression / Assessment and Plan / ED Course  I have reviewed the triage vital signs and the nursing notes.  Pertinent labs & imaging results that were available during my care of the patient were reviewed by me and considered in my medical decision making (see chart for details).     Patient with episode of central chest pain that started while she was asleep in bed after there was a disturbance at the neighbor's house. Resolved after aspirin and nitroglycerin.  EKG is unchanged without acute ST  changes.  Patient had cardiac catheterization in 2015 that showed minimal coronary disease.  FINDINGS: 1. LV showed good LV systolic function. 2. Left main was patent. 3. LAD was patent. 4. Diagonal 1 to diagonal 3 were very, very small which were patent. 5. Left circumflex had 10% to 15% mid stenosis. 6. OM-1 was very, very small.  OM-2 was moderate sized, which was     patent.  OM-3 was small which was     patent. 7. RCA was patent.  PDA and PLV branches were patent.   Heart score 4-5 despite reassuring cath.  Troponin negative.   2:04 AM Discussed case with Dr. Doylene Canard (cardiology) advises 2nd trop at 0300 and call back if elevated.  Troponin negative 2. Potassium is normal on recheck. Patient with no further episodes of chest pain. After discussion with Dr. Doylene Canard, he feels she is stable for discharge home and follow up with Dr. Terrence Dupont. Return precautions discussed.  Final Clinical Impressions(s) / ED Diagnoses   Final diagnoses:  Nonspecific chest pain    New Prescriptions New Prescriptions   No medications on file  I personally performed the services described in this documentation, which was scribed in my presence. The recorded information has been reviewed and is accurate.     Ezequiel Essex, MD 12/01/16 [phone]

## 2016-12-01 ENCOUNTER — Encounter (HOSPITAL_COMMUNITY): Payer: Self-pay | Admitting: Emergency Medicine

## 2016-12-01 ENCOUNTER — Emergency Department (HOSPITAL_COMMUNITY): Payer: Medicare Other

## 2016-12-01 DIAGNOSIS — R079 Chest pain, unspecified: Secondary | ICD-10-CM | POA: Diagnosis not present

## 2016-12-01 LAB — CBC WITH DIFFERENTIAL/PLATELET
BASOS PCT: 0 %
Basophils Absolute: 0 10*3/uL (ref 0.0–0.1)
Eosinophils Absolute: 0.4 10*3/uL (ref 0.0–0.7)
Eosinophils Relative: 3 %
HEMATOCRIT: 38 % (ref 36.0–46.0)
HEMOGLOBIN: 12 g/dL (ref 12.0–15.0)
LYMPHS ABS: 5.6 10*3/uL — AB (ref 0.7–4.0)
Lymphocytes Relative: 45 %
MCH: 28 pg (ref 26.0–34.0)
MCHC: 31.6 g/dL (ref 30.0–36.0)
MCV: 88.8 fL (ref 78.0–100.0)
MONOS PCT: 5 %
Monocytes Absolute: 0.7 10*3/uL (ref 0.1–1.0)
NEUTROS ABS: 5.7 10*3/uL (ref 1.7–7.7)
Neutrophils Relative %: 47 %
Platelets: 207 10*3/uL (ref 150–400)
RBC: 4.28 MIL/uL (ref 3.87–5.11)
RDW: 13.8 % (ref 11.5–15.5)
WBC: 12.5 10*3/uL — ABNORMAL HIGH (ref 4.0–10.5)

## 2016-12-01 LAB — COMPREHENSIVE METABOLIC PANEL
ALBUMIN: 3.5 g/dL (ref 3.5–5.0)
ALT: 35 U/L (ref 14–54)
AST: 63 U/L — ABNORMAL HIGH (ref 15–41)
Alkaline Phosphatase: 67 U/L (ref 38–126)
Anion gap: 12 (ref 5–15)
BUN: 19 mg/dL (ref 6–20)
CO2: 19 mmol/L — AB (ref 22–32)
Calcium: 8.7 mg/dL — ABNORMAL LOW (ref 8.9–10.3)
Chloride: 104 mmol/L (ref 101–111)
Creatinine, Ser: 1.12 mg/dL — ABNORMAL HIGH (ref 0.44–1.00)
GFR calc Af Amer: 51 mL/min — ABNORMAL LOW (ref >60)
GFR calc non Af Amer: 44 mL/min — ABNORMAL LOW (ref >60)
GLUCOSE: 150 mg/dL — AB (ref 65–99)
POTASSIUM: 6.2 mmol/L — AB (ref 3.5–5.1)
SODIUM: 135 mmol/L (ref 135–145)
TOTAL PROTEIN: 6.8 g/dL (ref 6.5–8.1)
Total Bilirubin: 1.3 mg/dL — ABNORMAL HIGH (ref 0.3–1.2)

## 2016-12-01 LAB — TROPONIN I
Troponin I: <0.03 ng/mL (ref <0.03)
Troponin I: <0.03 ng/mL (ref <0.03)

## 2016-12-01 LAB — POTASSIUM: POTASSIUM: 3.8 mmol/L (ref 3.5–5.1)

## 2016-12-01 LAB — LIPASE, BLOOD: Lipase: 24 U/L (ref 11–51)

## 2016-12-01 MED ORDER — ACETAMINOPHEN 325 MG PO TABS
650.0000 mg | ORAL_TABLET | Freq: Once | ORAL | Status: AC
  Administered 2016-12-01: 650 mg via ORAL
  Filled 2016-12-01: qty 2

## 2016-12-01 MED ORDER — ALPRAZOLAM 0.25 MG PO TABS
0.5000 mg | ORAL_TABLET | Freq: Once | ORAL | Status: AC
  Administered 2016-12-01: 0.5 mg via ORAL
  Filled 2016-12-01: qty 2

## 2016-12-01 NOTE — ED Triage Notes (Signed)
Pt to ED via [facility] with c/o central sharp chest pain.  St's onset after her neighbors house was raided by police.  EMS gave pt ASA (4 baby) and NTG x's 2 SL.  On arrival to ED pt st's she is pain free.

## 2016-12-01 NOTE — ED Notes (Signed)
Patient's family members called out, stating that they need to know what is going on, cursing every other word stating they've been here for over 3 hours and were told an hour. This RN went and explained that the bloodwork takes up to an hour to come back and the MD has to interpret results. Also discussed with family that we are going to get another troponin at 3am and the MD placed a consult with cardiology and was talking on the phone with them now. Awaiting to hear whether or not patient will stay. Family upset about the wait. RN apologized for wait time.

## 2016-12-01 NOTE — ED Notes (Signed)
MD at bedside. 

## 2016-12-01 NOTE — Discharge Instructions (Signed)
Your testing is reassuring without evidence of heart attack. Follow-up with Dr. Terrence Dupont. return to the ED if he develop worsening chest pain, shortness of breath or any other concerns.

## 2016-12-01 NOTE — ED Notes (Signed)
Patient ambulated to restroom with steady gait. Denies CP/SOB.   Transported to x-ray via stretcher.

## 2016-12-12 ENCOUNTER — Encounter (HOSPITAL_COMMUNITY): Payer: Self-pay | Admitting: Emergency Medicine

## 2016-12-12 ENCOUNTER — Ambulatory Visit
Admission: RE | Admit: 2016-12-12 | Discharge: 2016-12-12 | Disposition: A | Discharge status: Home / Self Care | Payer: Medicare Other | Source: Ambulatory Visit | Attending: Family Medicine | Admitting: Family Medicine

## 2016-12-12 ENCOUNTER — Other Ambulatory Visit: Payer: Self-pay | Admitting: Family Medicine

## 2016-12-12 ENCOUNTER — Emergency Department (HOSPITAL_COMMUNITY)
Admission: EM | Admit: 2016-12-12 | Discharge: 2016-12-12 | Disposition: A | Discharge status: Home / Self Care | Payer: Medicare Other | Attending: Emergency Medicine | Admitting: Emergency Medicine

## 2016-12-12 DIAGNOSIS — Z794 Long term (current) use of insulin: Secondary | ICD-10-CM | POA: Diagnosis not present

## 2016-12-12 DIAGNOSIS — Z7982 Long term (current) use of aspirin: Secondary | ICD-10-CM | POA: Insufficient documentation

## 2016-12-12 DIAGNOSIS — N63 Unspecified lump in unspecified breast: Secondary | ICD-10-CM

## 2016-12-12 DIAGNOSIS — R2232 Localized swelling, mass and lump, left upper limb: Secondary | ICD-10-CM

## 2016-12-12 DIAGNOSIS — Z87891 Personal history of nicotine dependence: Secondary | ICD-10-CM | POA: Diagnosis not present

## 2016-12-12 DIAGNOSIS — M7989 Other specified soft tissue disorders: Secondary | ICD-10-CM | POA: Insufficient documentation

## 2016-12-12 DIAGNOSIS — E119 Type 2 diabetes mellitus without complications: Secondary | ICD-10-CM | POA: Insufficient documentation

## 2016-12-12 DIAGNOSIS — R229 Localized swelling, mass and lump, unspecified: Secondary | ICD-10-CM | POA: Diagnosis present

## 2016-12-12 DIAGNOSIS — I10 Essential (primary) hypertension: Secondary | ICD-10-CM | POA: Insufficient documentation

## 2016-12-12 LAB — CBC WITH DIFFERENTIAL/PLATELET
Basophils Absolute: 0 10*3/uL (ref 0.0–0.1)
Basophils Relative: 0 %
Eosinophils Absolute: 0.1 10*3/uL (ref 0.0–0.7)
Eosinophils Relative: 1 %
HEMATOCRIT: 35.8 % — AB (ref 36.0–46.0)
Hemoglobin: 12 g/dL (ref 12.0–15.0)
LYMPHS PCT: 33 %
Lymphs Abs: 3.8 10*3/uL (ref 0.7–4.0)
MCH: 29.2 pg (ref 26.0–34.0)
MCHC: 33.5 g/dL (ref 30.0–36.0)
MCV: 87.1 fL (ref 78.0–100.0)
MONO ABS: 0.5 10*3/uL (ref 0.1–1.0)
MONOS PCT: 5 %
NEUTROS ABS: 7 10*3/uL (ref 1.7–7.7)
NEUTROS PCT: 61 %
Platelets: 211 10*3/uL (ref 150–400)
RBC: 4.11 MIL/uL (ref 3.87–5.11)
RDW: 13.7 % (ref 11.5–15.5)
WBC: 11.5 10*3/uL — ABNORMAL HIGH (ref 4.0–10.5)

## 2016-12-12 LAB — BASIC METABOLIC PANEL
ANION GAP: 9 (ref 5–15)
BUN: 22 mg/dL — ABNORMAL HIGH (ref 6–20)
CALCIUM: 9 mg/dL (ref 8.9–10.3)
CHLORIDE: 107 mmol/L (ref 101–111)
CO2: 25 mmol/L (ref 22–32)
CREATININE: 0.93 mg/dL (ref 0.44–1.00)
GFR calc Af Amer: >60 mL/min (ref >60)
GFR calc non Af Amer: 55 mL/min — ABNORMAL LOW (ref >60)
GLUCOSE: 103 mg/dL — AB (ref 65–99)
Potassium: 3.8 mmol/L (ref 3.5–5.1)
Sodium: 141 mmol/L (ref 135–145)

## 2016-12-12 LAB — CBG MONITORING, ED: Glucose-Capillary: 88 mg/dL (ref 65–99)

## 2016-12-12 MED ORDER — ENOXAPARIN SODIUM 100 MG/ML ~~LOC~~ SOLN
1.0000 mg/kg | Freq: Once | SUBCUTANEOUS | Status: AC
  Administered 2016-12-12: 85 mg via SUBCUTANEOUS
  Filled 2016-12-12: qty 0.85

## 2016-12-12 NOTE — ED Notes (Signed)
Dr. Enriqueta Shutter (rad. With the [facility]) phones to tell us they saw pt. Today and have elucidated a mass at left axilla, and have made plans to biopsy same. Their only concern is that they were unable to determine if left axillary vein is thrombosed.

## 2016-12-12 NOTE — ED Notes (Signed)
Bed: WA14 Expected date:  Expected time:  Means of arrival:  Comments: Triage 1 

## 2016-12-13 ENCOUNTER — Ambulatory Visit (HOSPITAL_COMMUNITY)
Admission: RE | Admit: 2016-12-13 | Discharge: 2016-12-13 | Disposition: A | Discharge status: Home / Self Care | Payer: Medicare Other | Source: Ambulatory Visit | Attending: Emergency Medicine | Admitting: Emergency Medicine

## 2016-12-13 ENCOUNTER — Ambulatory Visit (HOSPITAL_COMMUNITY): Admit: 2016-12-13 | Payer: Medicare Other

## 2016-12-13 DIAGNOSIS — R2232 Localized swelling, mass and lump, left upper limb: Secondary | ICD-10-CM | POA: Insufficient documentation

## 2016-12-13 DIAGNOSIS — M7989 Other specified soft tissue disorders: Secondary | ICD-10-CM | POA: Diagnosis not present

## 2016-12-13 NOTE — ED Provider Notes (Signed)
[facility] DEPT Provider Note   CSN: [id number] Arrival date & time: 12/12/16  1648     History   Chief Complaint No chief complaint on file.   HPI Rhonda Conrad is a 83 y.o. female.  HPI    83yo female presents at the request of Dr. Enriqueta Shutter at the breast center. They found left axillary mass but were not sure if there was a thrombosis in the left axillary vein.  Pt denies other symptoms, including no chest pain nor dyspnea. Reports her breasts are sore from her mammogram.  She denies any other symptoms, no fever, no cough. Denies pain or swelling in her left arm.    Past Medical History:  Diagnosis Date  . Anxiety   . Diabetes mellitus without complication ([place])   . High cholesterol   . Hypertension     Patient Active Problem List   Diagnosis Date Noted  . Anxiety state, unspecified 06/14/2013  . Chest pain 06/13/2013  . DM (diabetes mellitus) ([place]) 06/13/2013  . Hypertension 06/13/2013  . Hyperlipidemia 06/13/2013    Past Surgical History:  Procedure Laterality Date  . LEFT HEART CATHETERIZATION WITH CORONARY ANGIOGRAM N/A 06/15/2013   Procedure: LEFT HEART CATHETERIZATION WITH CORONARY ANGIOGRAM;  Surgeon: Clent Demark, MD;  Location: [facility] CATH LAB;  Service: Cardiovascular;  Laterality: N/A;  . vaginal polyp removal      OB History    No data available       Home Medications    Prior to Admission medications   Medication Sig Start Date End Date Taking? Authorizing Provider  ALPRAZolam Duanne Moron) 0.5 MG tablet Take 0.5 mg by mouth 3 (three) times daily as needed for anxiety.  02/12/14  Yes [provider]  amLODipine (NORVASC) 10 MG tablet Take 10 mg by mouth daily.   Yes [provider]  aspirin EC 81 MG EC tablet Take 1 tablet (81 mg total) by mouth daily. 06/15/13  Yes Delfina Redwood, MD  colchicine (COLCRYS) 0.6 MG tablet Take 0.6 mg by mouth 2 (two) times daily as needed (gout).    Yes [provider]  diclofenac  sodium (VOLTAREN) 1 % GEL Apply 2 g topically 2 (two) times daily as needed (arthritic pain).   Yes [provider]  insulin glargine (LANTUS) 100 UNIT/ML injection Inject 34 Units into the skin 2 (two) times daily.    Yes [provider]  losartan (COZAAR) 100 MG tablet Take 1 tablet by mouth daily 02/07/16  Yes [provider]  metoprolol (LOPRESSOR) 50 MG tablet Take 50 mg by mouth 2 (two) times daily.   Yes [provider]  ondansetron (ZOFRAN) 8 MG tablet TK 1 T PO BID PRF NAUSEA 11/19/16  Yes [provider]  oxyCODONE-acetaminophen (PERCOCET) 7.5-325 MG per tablet Take 0.5-1 tablets by mouth 2 (two) times daily as needed for severe pain.    Yes [provider]  simvastatin (ZOCOR) 20 MG tablet Take 20 mg by mouth every evening.   Yes [provider]  sitaGLIPtin-metformin (JANUMET) 50-500 MG per tablet Take 1 tablet by mouth daily.   Yes [provider]  triamterene-hydrochlorothiazide (MAXZIDE-25) 37.5-25 MG tablet TK 1 T PO ONCE A DAY IN THE MORNING 11/05/16  Yes [provider]  acetaminophen (TYLENOL) 500 MG tablet Take 500 mg by mouth every 6 (six) hours as needed for mild pain or headache.     [provider]  ondansetron (ZOFRAN ODT) 4 MG disintegrating tablet Take 1 tablet (  4 mg total) by mouth every 8 (eight) hours as needed for nausea or vomiting. Patient not taking: Reported on 12/12/2016 04/19/16   Quintella Reichert, MD  pantoprazole (PROTONIX) 40 MG tablet Take 40 mg by mouth 2 (two) times daily as needed (heartburn).     [provider]  tiZANidine (ZANAFLEX) 2 MG tablet TK 1 T PO QD HS PRF MUSCLE PAIN 11/05/16   [provider]  triamterene-hydrochlorothiazide (MAXZIDE) 75-50 MG tablet Take 1 tablet by mouth daily. Patient not taking: Reported on 12/12/2016 04/29/16   Dorie Rank, MD    Family History History reviewed. No pertinent family history.  Social History Social History    Substance Use Topics  . Smoking status: Former Research scientist (life sciences)  . Smokeless tobacco: Never Used  . Alcohol use No     Allergies   Penicillins   Review of Systems Review of Systems  Constitutional: Negative for fever.  HENT: Negative for sore throat.   Eyes: Negative for visual disturbance.  Respiratory: Negative for cough and shortness of breath.   Cardiovascular: Negative for chest pain.  Gastrointestinal: Negative for abdominal pain, nausea and vomiting.  Genitourinary: Negative for difficulty urinating.  Musculoskeletal: Negative for back pain and neck pain.  Skin: Negative for rash.  Neurological: Negative for syncope and headaches.     Physical Exam Updated Vital Signs BP (!) 170/82 (BP Location: Left Arm)   Pulse 78   Temp 98.5 F (36.9 C)   Resp 18   Ht 5\' 5"  (1.651 m)   Wt 84.9 kg (187 lb 4 oz)   SpO2 98%   BMI 31.16 kg/m   Physical Exam  Constitutional: She is oriented to person, place, and time. She appears well-developed and well-nourished. No distress.  HENT:  Head: Normocephalic and atraumatic.  Eyes: Conjunctivae and EOM are normal.  Neck: Normal range of motion.  Cardiovascular: Normal rate, regular rhythm, normal heart sounds and intact distal pulses.  Exam reveals no gallop and no friction rub.   No murmur heard. Pulmonary/Chest: Effort normal and breath sounds normal. No respiratory distress. She has no wheezes. She has no rales.  Abdominal: Soft. She exhibits no distension. There is no tenderness. There is no guarding.  Musculoskeletal: She exhibits no edema or tenderness.  Neurological: She is alert and oriented to person, place, and time.  Skin: Skin is warm and dry. No rash noted. She is not diaphoretic. No erythema.  Nursing note and vitals reviewed.    ED Treatments / Results  Labs (all labs ordered are listed, but only abnormal results are displayed) Labs Reviewed  CBC WITH DIFFERENTIAL/PLATELET - Abnormal; Notable for the following:        Result Value   WBC 11.5 (*)    HCT 35.8 (*)    All other components within normal limits  BASIC METABOLIC PANEL - Abnormal; Notable for the following:    Glucose, Bld 103 (*)    BUN 22 (*)    GFR calc non Af Amer 55 (*)    All other components within normal limits  CBG MONITORING, ED    EKG  EKG Interpretation None       Radiology Mm Diag Breast Tomo Bilateral  Result Date: 12/12/2016 CLINICAL DATA:  Ordering physician describes a palpable nodule within the left axilla. EXAM: 2D DIGITAL DIAGNOSTIC BILATERAL MAMMOGRAM WITH CAD AND ADJUNCT TOMO ULTRASOUND LEFT AXILLA COMPARISON:  Previous exam(s). ACR Breast Density Category a: The breast tissue is almost entirely fatty. FINDINGS: Bilateral 2D CC and  MLO projections were obtained today, with additional 3D tomosynthesis. There are no new dominant masses, suspicious calcifications or secondary signs of malignancy within either breast. Specifically, there is no mammographic abnormality within the upper-outer quadrant of the left breast, or visualized portion of the lower axilla, corresponding to the area of clinical concern. Mammographic images were processed with CAD. Targeted ultrasound is performed, showing an irregular mass within the left axilla which measures 6.2 cm greatest dimension. The immediately adjacent left axillary vein appears distended and no flow is demonstrable within this portion of the vein suggesting DVT. IMPRESSION: 1. Highly suspicious mass within the left axilla, measuring 6.2 cm greatest dimension, corresponding to the area of clinical concern. Ultrasound-guided biopsy is recommended. 2. Possible DVT within the adjacent left axillary vein. 3. No mammographic evidence of malignancy within either breast. These findings were discussed with Dr. Kenton Kingfisher. Patient was sent to the [facility] emergency room to exclude axillary vein DVT. RECOMMENDATION: Ultrasound-guided biopsy of the left axillary mass. Patient will be scheduled  for ultrasound-guided biopsy at her earliest convenience. I have discussed the findings and recommendations with the patient. Results were also provided in writing at the conclusion of the visit. If applicable, a reminder letter will be sent to the patient regarding the next appointment. BI-RADS CATEGORY  5: Highly suggestive of malignancy. Electronically Signed   By: Franki Cabot M.D.   On: 12/12/2016 17:14   [place] Axilla Left  Result Date: 12/12/2016 CLINICAL DATA:  Ordering physician describes a palpable nodule within the left axilla. EXAM: 2D DIGITAL DIAGNOSTIC BILATERAL MAMMOGRAM WITH CAD AND ADJUNCT TOMO ULTRASOUND LEFT AXILLA COMPARISON:  Previous exam(s). ACR Breast Density Category a: The breast tissue is almost entirely fatty. FINDINGS: Bilateral 2D CC and MLO projections were obtained today, with additional 3D tomosynthesis. There are no new dominant masses, suspicious calcifications or secondary signs of malignancy within either breast. Specifically, there is no mammographic abnormality within the upper-outer quadrant of the left breast, or visualized portion of the lower axilla, corresponding to the area of clinical concern. Mammographic images were processed with CAD. Targeted ultrasound is performed, showing an irregular mass within the left axilla which measures 6.2 cm greatest dimension. The immediately adjacent left axillary vein appears distended and no flow is demonstrable within this portion of the vein suggesting DVT. IMPRESSION: 1. Highly suspicious mass within the left axilla, measuring 6.2 cm greatest dimension, corresponding to the area of clinical concern. Ultrasound-guided biopsy is recommended. 2. Possible DVT within the adjacent left axillary vein. 3. No mammographic evidence of malignancy within either breast. These findings were discussed with Dr. Kenton Kingfisher. Patient was sent to the [facility] emergency room to exclude axillary vein DVT. RECOMMENDATION: Ultrasound-guided biopsy of the  left axillary mass. Patient will be scheduled for ultrasound-guided biopsy at her earliest convenience. I have discussed the findings and recommendations with the patient. Results were also provided in writing at the conclusion of the visit. If applicable, a reminder letter will be sent to the patient regarding the next appointment. BI-RADS CATEGORY  5: Highly suggestive of malignancy. Electronically Signed   By: Franki Cabot M.D.   On: 12/12/2016 17:14    Procedures Procedures (including critical care time)  Medications Ordered in ED Medications  enoxaparin (LOVENOX) injection 85 mg (85 mg Subcutaneous Given 12/12/16 2144)     Initial Impression / Assessment and Plan / ED Course  I have reviewed the triage vital signs and the nursing notes.  Pertinent labs & imaging results that were available  during my care of the patient were reviewed by me and considered in my medical decision making (see chart for details).     83yo female presents at the request of Dr. Enriqueta Shutter at the breast center. They found left axillary mass but were not sure if there was a thrombosis in the left axillary vein.  Pt denies other symptoms, including no chest pain nor dyspnea. Called radiology to confirm upper extremity vascular US DVT study would be the most appropriate test.  Unfortunately, the patient arrived and roomed at 630PM, and ordered vascular study within 15 minutes of arrival, however the vascular tech tells me they are done with studies at [facility] and she still has multiple studies pending and requesting study tomorrow unless patient unstable.  Patient reports she would be able to return to [facility] for vascular study tomorrow.  Given radiology concern for ?thrombosis of axillary vein at breast center, patient without history of bleeding disorder, will plan for empiric lovenox at this time.  Discussed risk of bleeding.  Obtained baseline hgb and renal function. Given 1mg /kg lovenox.  Recommend return for vascular  DVT study in AM and placed order.    Final Clinical Impressions(s) / ED Diagnoses   Final diagnoses:  Concern for possible axillary venous thrombosis on evaluation at breast center    New Prescriptions Discharge Medication List as of 12/12/2016  9:04 PM       Gareth Morgan, MD 12/13/16 0310

## 2016-12-13 NOTE — Progress Notes (Signed)
*  PRELIMINARY RESULTS* Vascular Ultrasound Left upper extremity venous duplex has been completed.  Preliminary findings: no evidence of DVT or superficial thrombosis.  Landry Mellow, RDMS, RVT  12/13/2016, 4:09 PM

## 2016-12-17 ENCOUNTER — Ambulatory Visit
Admission: RE | Admit: 2016-12-17 | Discharge: 2016-12-17 | Disposition: A | Discharge status: Home / Self Care | Payer: Medicare Other | Source: Ambulatory Visit | Attending: Family Medicine | Admitting: Family Medicine

## 2016-12-17 ENCOUNTER — Other Ambulatory Visit (HOSPITAL_COMMUNITY)
Admission: RE | Admit: 2016-12-17 | Discharge: 2016-12-17 | Disposition: A | Discharge status: Home / Self Care | Payer: Medicare Other | Source: Ambulatory Visit | Attending: Dermatology | Admitting: Dermatology

## 2016-12-17 ENCOUNTER — Other Ambulatory Visit: Payer: Self-pay | Admitting: Family Medicine

## 2016-12-17 DIAGNOSIS — N63 Unspecified lump in unspecified breast: Secondary | ICD-10-CM

## 2016-12-27 ENCOUNTER — Ambulatory Visit: Payer: Self-pay | Admitting: General Surgery

## 2017-01-18 NOTE — Pre-Procedure Instructions (Signed)
Rhonda Conrad  01/18/2017      [place], [place] [address] [place] [address] [place] [address] Phone: [phone] Fax: [phone]  [id number] [place], [place] [address] Phone: [phone] Fax: [phone]  [facility] [place], [place] - [place] AT [facility] OF [place] [address] Phone: [phone] Fax: [phone]    Your procedure is scheduled on August 27  Report to [place] at [place].M.  Call this number if you have problems the morning of surgery:  [phone]   Remember:  Do not eat food or drink liquids after midnight.  Continue all other medications as directed by your physician except follow these instructions about you medications   Take these medicines the morning of surgery with A SIP OF WATER acetaminophen (TYLENOL),  amLODipine (NORVASC) , colchicine (COLCRYS) if needed, metoprolol (LOPRESSOR), ondansetron (ZOFRAN),  oxyCODONE-acetaminophen (PERCOCET) , pantoprazole (PROTONIX),   7 days prior to surgery STOP taking any Aleve, Naproxen, Ibuprofen, Motrin, Advil, Goody's, BC's, all herbal medications, fish oil, and all vitamins  Follow your doctors instructions regarding your Aspirin.  If no instructions were given by the doctor you will need to call the office to get instructions.  Your pre admission RN will also call for those instructions    WHAT DO I DO ABOUT MY DIABETES MEDICATION?   Marland Kitchen Do not take oral diabetes medicines (pills) the morning of surgery., sitaGLIPtin-metformin (JANUMET)  . THE NIGHT BEFORE SURGERY, take ______17_____ units of ___insulin glargine (LANTUS) ________insulin.       Marland Kitchen HE MORNING OF SURGERY, take _____17________ units of _insulin glargine (LANTUS) _________insulin.   How to Manage Your  Diabetes Before and After Surgery  Why is it important to control my blood sugar before and after surgery? . Improving blood sugar levels before and after surgery helps healing and can limit problems. . A way of improving blood sugar control is eating a healthy diet by: o  Eating less sugar and carbohydrates o  Increasing activity/exercise o  Talking with your doctor about reaching your blood sugar goals . High blood sugars (greater than 180 mg/dL) can raise your risk of infections and slow your recovery, so you will need to focus on controlling your diabetes during the weeks before surgery. . Make sure that the doctor who takes care of your diabetes knows about your planned surgery including the date and location.  How do I manage my blood sugar before surgery? . Check your blood sugar at least 4 times a day, starting 2 days before surgery, to make sure that the level is not too high or low. o Check your blood sugar the morning of your surgery when you wake up and every 2 hours until you get to the Short Stay unit. . If your blood sugar is less than 70 mg/dL, you will need to treat for low blood sugar: o Do not take insulin. o Treat a low blood sugar (less than 70 mg/dL) with  cup of clear juice (cranberry or apple), 4 glucose tablets, OR glucose gel. o Recheck blood sugar in 15 minutes after treatment (to make sure it is greater than 70 mg/dL). If your blood sugar is not greater than 70 mg/dL on recheck, call [phone] for further instructions. . Report your blood sugar to  the short stay nurse when you get to Short Stay.  . If you are admitted to the hospital after surgery: o Your blood sugar will be checked by the staff and you will probably be given insulin after surgery (instead of oral diabetes medicines) to make sure you have good blood sugar levels. o The goal for blood sugar control after surgery is 80-180 mg/dL.    Do not wear jewelry, make-up or nail polish.  Do not wear  lotions, powders, or perfumes, or deoderant.  Do not shave 48 hours prior to surgery.  Men may shave face and neck.  Do not bring valuables to the hospital.  [facility] is not responsible for any belongings or valuables.  Contacts, dentures or bridgework may not be worn into surgery.  Leave your suitcase in the car.  After surgery it may be brought to your room.  For patients admitted to the hospital, discharge time will be determined by your treatment team.  Patients discharged the day of surgery will not be allowed to drive home.    Special instructions:   [facility]- Preparing For Surgery  Before surgery, you can play an important role. Because skin is not sterile, your skin needs to be as free of germs as possible. You can reduce the number of germs on your skin by washing with CHG (chlorahexidine gluconate) Soap before surgery.  CHG is an antiseptic cleaner which kills germs and bonds with the skin to continue killing germs even after washing.  Please do not use if you have an allergy to CHG or antibacterial soaps. If your skin becomes reddened/irritated stop using the CHG.  Do not shave (including legs and underarms) for at least 48 hours prior to first CHG shower. It is OK to shave your face.  Please follow these instructions carefully.   1. Shower the NIGHT BEFORE SURGERY and the MORNING OF SURGERY with CHG.   2. If you chose to wash your hair, wash your hair first as usual with your normal shampoo.  3. After you shampoo, rinse your hair and body thoroughly to remove the shampoo.  4. Use CHG as you would any other liquid soap. You can apply CHG directly to the skin and wash gently with a scrungie or a clean washcloth.   5. Apply the CHG Soap to your body ONLY FROM THE NECK DOWN.  Do not use on open wounds or open sores. Avoid contact with your eyes, ears, mouth and genitals (private parts). Wash genitals (private parts) with your normal soap.  6. Wash thoroughly, paying  special attention to the area where your surgery will be performed.  7. Thoroughly rinse your body with warm water from the neck down.  8. DO NOT shower/wash with your normal soap after using and rinsing off the CHG Soap.  9. Pat yourself dry with a CLEAN TOWEL.   10. Wear CLEAN PAJAMAS   11. Place CLEAN SHEETS on your bed the night of your first shower and DO NOT SLEEP WITH PETS.    Day of Surgery: Do not apply any deodorants/lotions. Please wear clean clothes to the hospital/surgery center.      Please read over the following fact sheets that you were given.

## 2017-01-21 ENCOUNTER — Encounter (HOSPITAL_COMMUNITY): Payer: Self-pay

## 2017-01-21 ENCOUNTER — Encounter (HOSPITAL_COMMUNITY)
Admission: RE | Admit: 2017-01-21 | Discharge: 2017-01-21 | Disposition: A | Discharge status: Home / Self Care | Payer: Medicare Other | Source: Ambulatory Visit | Attending: General Surgery | Admitting: General Surgery

## 2017-01-21 DIAGNOSIS — R079 Chest pain, unspecified: Secondary | ICD-10-CM | POA: Diagnosis not present

## 2017-01-21 DIAGNOSIS — R590 Localized enlarged lymph nodes: Secondary | ICD-10-CM | POA: Diagnosis not present

## 2017-01-21 DIAGNOSIS — F411 Generalized anxiety disorder: Secondary | ICD-10-CM | POA: Diagnosis not present

## 2017-01-21 DIAGNOSIS — E785 Hyperlipidemia, unspecified: Secondary | ICD-10-CM | POA: Insufficient documentation

## 2017-01-21 DIAGNOSIS — I10 Essential (primary) hypertension: Secondary | ICD-10-CM | POA: Diagnosis not present

## 2017-01-21 DIAGNOSIS — Z01812 Encounter for preprocedural laboratory examination: Secondary | ICD-10-CM | POA: Diagnosis present

## 2017-01-21 DIAGNOSIS — E119 Type 2 diabetes mellitus without complications: Secondary | ICD-10-CM | POA: Insufficient documentation

## 2017-01-21 HISTORY — DX: Unspecified osteoarthritis, unspecified site: M19.90

## 2017-01-21 HISTORY — DX: Headache, unspecified: R51.9

## 2017-01-21 HISTORY — DX: Headache: R51

## 2017-01-21 LAB — BASIC METABOLIC PANEL
ANION GAP: 8 (ref 5–15)
BUN: 20 mg/dL (ref 6–20)
CO2: 24 mmol/L (ref 22–32)
Calcium: 9.3 mg/dL (ref 8.9–10.3)
Chloride: 107 mmol/L (ref 101–111)
Creatinine, Ser: 1.06 mg/dL — ABNORMAL HIGH (ref 0.44–1.00)
GFR calc Af Amer: 55 mL/min — ABNORMAL LOW (ref >60)
GFR calc non Af Amer: 47 mL/min — ABNORMAL LOW (ref >60)
GLUCOSE: 98 mg/dL (ref 65–99)
Potassium: 3.6 mmol/L (ref 3.5–5.1)
Sodium: 139 mmol/L (ref 135–145)

## 2017-01-21 LAB — GLUCOSE, CAPILLARY: Glucose-Capillary: 86 mg/dL (ref 65–99)

## 2017-01-21 LAB — CBC
HEMATOCRIT: 38.8 % (ref 36.0–46.0)
Hemoglobin: 12.4 g/dL (ref 12.0–15.0)
MCH: 28.1 pg (ref 26.0–34.0)
MCHC: 32 g/dL (ref 30.0–36.0)
MCV: 88 fL (ref 78.0–100.0)
Platelets: 229 10*3/uL (ref 150–400)
RBC: 4.41 MIL/uL (ref 3.87–5.11)
RDW: 13.7 % (ref 11.5–15.5)
WBC: 9.3 10*3/uL (ref 4.0–10.5)

## 2017-01-21 LAB — HEMOGLOBIN A1C
Hgb A1c MFr Bld: 7.1 % — ABNORMAL HIGH (ref 4.8–5.6)
Mean Plasma Glucose: 157.07 mg/dL

## 2017-01-21 MED ORDER — CHLORHEXIDINE GLUCONATE CLOTH 2 % EX PADS: 6.0000 | MEDICATED_PAD | Freq: Once | CUTANEOUS | Status: DC

## 2017-01-22 NOTE — Progress Notes (Signed)
Anesthesia chart review: Patient is an 83 year old female scheduled for left axillary lymph node biopsy on 01/28/2017 by Dr. Autumn Messing. She had an incidental finding of RUL pulmonary nodule on recent c-spine CT. Subsequent chest CT showed left axillary mass, left breast nodule, multiple pulmonary nodules, and indeterminate thyroid nodule. She underwent PET scan, thyroid U/S, and mammogram with results listed below. 12/17/16 left axillary core needle biopsy showed benign lymph nodal tissues. 12/18/16 tissue flow cytometry showed no monoclonal B cell population or abnormal T cell phenotype identified. She is now scheduled for surgical left axillary lymph node biopsy in hopes to get a definitive diagnosis.   History includes former smoker, DM2, HTN, hypercholesterolemia, anxiety, arthritis, headache. No significant CAD by 2015 cath (10-15% mid CX).  PCP is listed as Selina Cooley, FNP.  Meds include Xanax, amlodipine, aspirin 81 mg, BuSpar, colchicine, Lantus, losartan, Zofran, Percocet, Protonix, Zocor, Janumet, Zanaflex, Maxide-25.  BP (!) 148/75   Pulse 85   Temp 36.7 C   Resp 20   Ht 5\' 4"  (1.626 m)   Wt 187 lb 6.4 oz (85 kg)   SpO2 96%   BMI 32.17 kg/m   EKG 11/30/16: SR.  Cardiac cath 06/16/13 (Dr. Terrence Dupont): FINDINGS: 1. LV showed good LV systolic function. 2. Left main was patent. 3. LAD was patent. 4. Diagonal 1 to diagonal 3 were very, very small which were patent. 5. Left circumflex had 10% to 15% mid stenosis. 6. OM-1 was very, very small.  OM-2 was moderate sized, which was patent.  OM-3 was small which was patent. 7. RCA was patent.  PDA and PLV branches were patent.  CXR 12/01/16: IMPRESSION: Known pulmonary nodules. No consolidation or effusion. Normal Vasculature.  Chest CT 11/12/16: IMPRESSION: - There is a large soft tissue mass within the left axilla concerning for a malignant process, potentially metastatic adenopathy. - Irregular nodule within the left breast  concerning for the possibility of primary breast malignancy. This needs dedicated evaluation with diagnostic mammography. At the time of diagnostic mammography, ultrasound of the left axilla can be performed. - Multiple bilateral pulmonary nodules as detailed above which are nonspecific in etiology however raise the possibility of metastatic disease. - Additionally, there are larger sub solid nodules within the right middle lobe and subpleural right upper lobe which may represent metastatic disease, infectious/ inflammatory process or potentially pulmonary malignancy. Recommend attention on follow-up. - Given the multitude of findings, recommend further evaluation with PET-CT. - Indeterminate thyroid nodule. If not previously performed, recommend further evaluation with thyroid ultrasound.  PET scan 11/23/16: IMPRESSION: 1. Mildly hypermetabolic left axillary mass is suspicious for localized adenopathy. Favor lymphoma. 2. The left breast nodule described on prior diagnostic CT is not significantly hypermetabolic. 3. Small bilateral pulmonary nodules are primarily felt to be similar. The majority are below PET resolution. Therefore, lack of hypermetabolism is nonspecific. Consider CT followup at 3 months. If there are infectious symptoms, patient could be treated with antibiotics and shorter term follow-up CT performed. 4.  Aortic Atherosclerosis (ICD10-I70.0).  Thyroid U/S 11/19/16: FINDINGS: Parenchymal Echotexture: Moderately heterogenous Isthmus: 5 mm Right lobe: 3.8 x 2.0 x 1.9 cm Left lobe: 4.1 x 1.8 x 1.8 cm ACR TI-RADS recommendations: This nodule does NOT meet TI-RADS criteria for biopsy or dedicated follow-up. There are additional numerous bilateral subcentimeter cystic thyroid nodules all measuring 7 mm or less in size. These also does not meet criteria for follow-up or biopsy.  Mammogram 12/12/16: IMPRESSION: 1. Highly suspicious mass within the left axilla, measuring  6.2  cm greatest dimension, corresponding to the area of clinical concern. Ultrasound-guided biopsy is recommended. 2. Possible DVT within the adjacent left axillary vein. 3. No mammographic evidence of malignancy within either breast. RECOMMENDATION: Ultrasound-guided biopsy of the left axillary mass.  Preoperative labs noted. Cr 1.06. CBC WNL. A1c 7.1.   If no acute changes then I anticipate that she can proceed as planned.  George Hugh [facility] Short Stay Center/Anesthesiology Phone [phone] 01/22/2017 6:59 PM

## 2017-01-25 MED ORDER — VANCOMYCIN HCL IN DEXTROSE 1-5 GM/200ML-% IV SOLN
1000.0000 mg | INTRAVENOUS | Status: AC
  Administered 2017-01-28: 1000 mg via INTRAVENOUS
  Filled 2017-01-25: qty 200

## 2017-01-28 ENCOUNTER — Ambulatory Visit (HOSPITAL_COMMUNITY)
Admission: RE | Admit: 2017-01-28 | Discharge: 2017-01-28 | Disposition: A | Discharge status: Home / Self Care | Payer: Medicare Other | Source: Ambulatory Visit | Attending: General Surgery | Admitting: General Surgery

## 2017-01-28 ENCOUNTER — Encounter (HOSPITAL_COMMUNITY)
Admission: RE | Disposition: A | Discharge status: Home / Self Care | Payer: Self-pay | Source: Ambulatory Visit | Attending: General Surgery

## 2017-01-28 ENCOUNTER — Ambulatory Visit (HOSPITAL_COMMUNITY): Payer: Medicare Other | Admitting: Vascular Surgery

## 2017-01-28 ENCOUNTER — Ambulatory Visit (HOSPITAL_COMMUNITY): Payer: Medicare Other | Admitting: Certified Registered"

## 2017-01-28 ENCOUNTER — Encounter (HOSPITAL_COMMUNITY): Payer: Self-pay | Admitting: Surgery

## 2017-01-28 DIAGNOSIS — M549 Dorsalgia, unspecified: Secondary | ICD-10-CM | POA: Diagnosis not present

## 2017-01-28 DIAGNOSIS — Z880 Allergy status to penicillin: Secondary | ICD-10-CM | POA: Diagnosis not present

## 2017-01-28 DIAGNOSIS — Z794 Long term (current) use of insulin: Secondary | ICD-10-CM | POA: Insufficient documentation

## 2017-01-28 DIAGNOSIS — Z87891 Personal history of nicotine dependence: Secondary | ICD-10-CM | POA: Diagnosis not present

## 2017-01-28 DIAGNOSIS — E7800 Pure hypercholesterolemia, unspecified: Secondary | ICD-10-CM | POA: Insufficient documentation

## 2017-01-28 DIAGNOSIS — Z79899 Other long term (current) drug therapy: Secondary | ICD-10-CM | POA: Diagnosis not present

## 2017-01-28 DIAGNOSIS — R590 Localized enlarged lymph nodes: Secondary | ICD-10-CM | POA: Diagnosis present

## 2017-01-28 DIAGNOSIS — M1990 Unspecified osteoarthritis, unspecified site: Secondary | ICD-10-CM | POA: Insufficient documentation

## 2017-01-28 DIAGNOSIS — E119 Type 2 diabetes mellitus without complications: Secondary | ICD-10-CM | POA: Insufficient documentation

## 2017-01-28 DIAGNOSIS — I10 Essential (primary) hypertension: Secondary | ICD-10-CM | POA: Diagnosis not present

## 2017-01-28 DIAGNOSIS — Z811 Family history of alcohol abuse and dependence: Secondary | ICD-10-CM | POA: Insufficient documentation

## 2017-01-28 HISTORY — PX: AXILLARY LYMPH NODE BIOPSY: SHX5737

## 2017-01-28 LAB — GLUCOSE, CAPILLARY
GLUCOSE-CAPILLARY: 102 mg/dL — AB (ref 65–99)
GLUCOSE-CAPILLARY: 104 mg/dL — AB (ref 65–99)
Glucose-Capillary: 79 mg/dL (ref 65–99)

## 2017-01-28 SURGERY — AXILLARY LYMPH NODE BIOPSY
Anesthesia: General | Site: Axilla | Laterality: Left

## 2017-01-28 MED ORDER — FENTANYL CITRATE (PF) 250 MCG/5ML IJ SOLN
INTRAMUSCULAR | Status: AC
  Filled 2017-01-28: qty 5

## 2017-01-28 MED ORDER — BUPIVACAINE-EPINEPHRINE 0.25% -1:200000 IJ SOLN
INTRAMUSCULAR | Status: DC | PRN
  Administered 2017-01-28: 9 mL

## 2017-01-28 MED ORDER — ACETAMINOPHEN 500 MG PO TABS
ORAL_TABLET | ORAL | Status: AC
  Administered 2017-01-28: 1000 mg via ORAL
  Filled 2017-01-28: qty 2

## 2017-01-28 MED ORDER — CELECOXIB 200 MG PO CAPS
400.0000 mg | ORAL_CAPSULE | ORAL | Status: AC
  Administered 2017-01-28: 400 mg via ORAL

## 2017-01-28 MED ORDER — PHENYLEPHRINE HCL 10 MG/ML IJ SOLN
INTRAMUSCULAR | Status: DC | PRN
  Administered 2017-01-28: 160 ug via INTRAVENOUS
  Administered 2017-01-28 (×2): 80 ug via INTRAVENOUS

## 2017-01-28 MED ORDER — MIDAZOLAM HCL 2 MG/2ML IJ SOLN
INTRAMUSCULAR | Status: AC
  Filled 2017-01-28: qty 2

## 2017-01-28 MED ORDER — PROPOFOL 10 MG/ML IV BOLUS
INTRAVENOUS | Status: DC | PRN
  Administered 2017-01-28: 50 mg via INTRAVENOUS
  Administered 2017-01-28: 100 mg via INTRAVENOUS

## 2017-01-28 MED ORDER — MEPERIDINE HCL 25 MG/ML IJ SOLN: 6.2500 mg | INTRAMUSCULAR | Status: DC | PRN

## 2017-01-28 MED ORDER — OXYCODONE HCL 5 MG PO TABS
ORAL_TABLET | ORAL | Status: AC
  Administered 2017-01-28: 5 mg via ORAL
  Filled 2017-01-28: qty 1

## 2017-01-28 MED ORDER — CELECOXIB 200 MG PO CAPS
ORAL_CAPSULE | ORAL | Status: AC
  Administered 2017-01-28: 400 mg via ORAL
  Filled 2017-01-28: qty 2

## 2017-01-28 MED ORDER — 0.9 % SODIUM CHLORIDE (POUR BTL) OPTIME
TOPICAL | Status: DC | PRN
  Administered 2017-01-28: 1000 mL

## 2017-01-28 MED ORDER — GABAPENTIN 300 MG PO CAPS
ORAL_CAPSULE | ORAL | Status: AC
  Administered 2017-01-28: 300 mg via ORAL
  Filled 2017-01-28: qty 1

## 2017-01-28 MED ORDER — PHENYLEPHRINE 40 MCG/ML (10ML) SYRINGE FOR IV PUSH (FOR BLOOD PRESSURE SUPPORT)
PREFILLED_SYRINGE | INTRAVENOUS | Status: AC
  Filled 2017-01-28: qty 10

## 2017-01-28 MED ORDER — FENTANYL CITRATE (PF) 250 MCG/5ML IJ SOLN
INTRAMUSCULAR | Status: DC | PRN
  Administered 2017-01-28 (×3): 50 ug via INTRAVENOUS

## 2017-01-28 MED ORDER — HYDROMORPHONE HCL 1 MG/ML IJ SOLN
0.2500 mg | INTRAMUSCULAR | Status: DC | PRN
  Administered 2017-01-28 (×2): 0.25 mg via INTRAVENOUS

## 2017-01-28 MED ORDER — LIDOCAINE 2% (20 MG/ML) 5 ML SYRINGE
INTRAMUSCULAR | Status: AC
  Filled 2017-01-28: qty 10

## 2017-01-28 MED ORDER — PROPOFOL 10 MG/ML IV BOLUS
INTRAVENOUS | Status: AC
  Filled 2017-01-28: qty 20

## 2017-01-28 MED ORDER — HYDROMORPHONE HCL 1 MG/ML IJ SOLN
INTRAMUSCULAR | Status: AC
  Administered 2017-01-28: 0.25 mg via INTRAVENOUS
  Filled 2017-01-28: qty 1

## 2017-01-28 MED ORDER — ALBUTEROL SULFATE HFA 108 (90 BASE) MCG/ACT IN AERS
INHALATION_SPRAY | RESPIRATORY_TRACT | Status: DC | PRN
  Administered 2017-01-28 (×2): 2 via RESPIRATORY_TRACT

## 2017-01-28 MED ORDER — GABAPENTIN 300 MG PO CAPS
300.0000 mg | ORAL_CAPSULE | ORAL | Status: AC
  Administered 2017-01-28: 300 mg via ORAL

## 2017-01-28 MED ORDER — FENTANYL CITRATE (PF) 100 MCG/2ML IJ SOLN: 25.0000 ug | INTRAMUSCULAR | Status: DC | PRN

## 2017-01-28 MED ORDER — HYDROCODONE-ACETAMINOPHEN 5-325 MG PO TABS: 1.0000 | ORAL_TABLET | ORAL | 0 refills | Status: DC | PRN

## 2017-01-28 MED ORDER — PHENYLEPHRINE HCL 10 MG/ML IJ SOLN
INTRAMUSCULAR | Status: DC | PRN
  Administered 2017-01-28: 50 ug/min via INTRAVENOUS

## 2017-01-28 MED ORDER — ONDANSETRON HCL 4 MG/2ML IJ SOLN
INTRAMUSCULAR | Status: AC
  Filled 2017-01-28: qty 2

## 2017-01-28 MED ORDER — BUPIVACAINE-EPINEPHRINE 0.25% -1:200000 IJ SOLN
INTRAMUSCULAR | Status: AC
  Filled 2017-01-28: qty 1

## 2017-01-28 MED ORDER — ROCURONIUM BROMIDE 10 MG/ML (PF) SYRINGE
PREFILLED_SYRINGE | INTRAVENOUS | Status: AC
  Filled 2017-01-28: qty 5

## 2017-01-28 MED ORDER — ONDANSETRON HCL 4 MG/2ML IJ SOLN: 4.0000 mg | Freq: Once | INTRAMUSCULAR | Status: DC | PRN

## 2017-01-28 MED ORDER — EPHEDRINE SULFATE 50 MG/ML IJ SOLN
INTRAMUSCULAR | Status: DC | PRN
  Administered 2017-01-28: 10 mg via INTRAVENOUS

## 2017-01-28 MED ORDER — ONDANSETRON HCL 4 MG/2ML IJ SOLN
INTRAMUSCULAR | Status: DC | PRN
  Administered 2017-01-28: 4 mg via INTRAVENOUS

## 2017-01-28 MED ORDER — OXYCODONE HCL 5 MG/5ML PO SOLN: 5.0000 mg | Freq: Once | ORAL | Status: AC | PRN

## 2017-01-28 MED ORDER — OXYCODONE HCL 5 MG PO TABS
5.0000 mg | ORAL_TABLET | Freq: Once | ORAL | Status: AC | PRN
  Administered 2017-01-28: 5 mg via ORAL

## 2017-01-28 MED ORDER — EPHEDRINE 5 MG/ML INJ
INTRAVENOUS | Status: AC
  Filled 2017-01-28: qty 10

## 2017-01-28 MED ORDER — LACTATED RINGERS IV SOLN
INTRAVENOUS | Status: DC
  Administered 2017-01-28 (×2): via INTRAVENOUS

## 2017-01-28 MED ORDER — ACETAMINOPHEN 500 MG PO TABS
1000.0000 mg | ORAL_TABLET | ORAL | Status: AC
  Administered 2017-01-28: 1000 mg via ORAL

## 2017-01-28 MED ORDER — LIDOCAINE HCL (CARDIAC) 20 MG/ML IV SOLN
INTRAVENOUS | Status: DC | PRN
  Administered 2017-01-28: 100 mg via INTRATRACHEAL

## 2017-01-28 SURGICAL SUPPLY — 32 items
ADH SKN CLS APL DERMABOND .7 (GAUZE/BANDAGES/DRESSINGS) ×1
APPLIER CLIP 9.375 MED OPEN (MISCELLANEOUS) ×6
APR CLP MED 9.3 20 MLT OPN (MISCELLANEOUS) ×2
CANISTER SUCT 3000ML PPV (MISCELLANEOUS) ×2 IMPLANT
CHLORAPREP W/TINT 10.5 ML (MISCELLANEOUS) ×3 IMPLANT
CLIP APPLIE 9.375 MED OPEN (MISCELLANEOUS) IMPLANT
CONT SPEC 4OZ CLIKSEAL STRL BL (MISCELLANEOUS) ×5 IMPLANT
COVER SURGICAL LIGHT HANDLE (MISCELLANEOUS) ×3 IMPLANT
DECANTER SPIKE VIAL GLASS SM (MISCELLANEOUS) ×3 IMPLANT
DERMABOND ADVANCED (GAUZE/BANDAGES/DRESSINGS) ×2
DERMABOND ADVANCED .7 DNX12 (GAUZE/BANDAGES/DRESSINGS) ×1 IMPLANT
DRAPE LAPAROTOMY 100X72 PEDS (DRAPES) ×3 IMPLANT
ELECT CAUTERY BLADE 6.4 (BLADE) ×3 IMPLANT
ELECT REM PT RETURN 9FT ADLT (ELECTROSURGICAL) ×3
ELECTRODE REM PT RTRN 9FT ADLT (ELECTROSURGICAL) ×1 IMPLANT
GLOVE BIO SURGEON STRL SZ7.5 (GLOVE) ×3 IMPLANT
GOWN STRL REUS W/ TWL LRG LVL3 (GOWN DISPOSABLE) ×2 IMPLANT
GOWN STRL REUS W/TWL LRG LVL3 (GOWN DISPOSABLE) ×12
KIT BASIN OR (CUSTOM PROCEDURE TRAY) ×3 IMPLANT
KIT ROOM TURNOVER OR (KITS) ×3 IMPLANT
NDL HYPO 25GX1X1/2 BEV (NEEDLE) ×2 IMPLANT
NEEDLE HYPO 25GX1X1/2 BEV (NEEDLE) ×3 IMPLANT
NS IRRIG 1000ML POUR BTL (IV SOLUTION) ×3 IMPLANT
PACK SURGICAL SETUP 50X90 (CUSTOM PROCEDURE TRAY) ×3 IMPLANT
PAD ARMBOARD 7.5X6 YLW CONV (MISCELLANEOUS) ×3 IMPLANT
PENCIL BUTTON HOLSTER BLD 10FT (ELECTRODE) ×3 IMPLANT
SUT MNCRL AB 4-0 PS2 18 (SUTURE) ×3 IMPLANT
SUT VIC AB 3-0 SH 18 (SUTURE) ×2 IMPLANT
SUT VIC AB 3-0 SH 27 (SUTURE) ×3
SUT VIC AB 3-0 SH 27X BRD (SUTURE) ×1 IMPLANT
SYR CONTROL 10ML LL (SYRINGE) ×4 IMPLANT
TOWEL OR 17X24 6PK STRL BLUE (TOWEL DISPOSABLE) ×3 IMPLANT

## 2017-01-28 NOTE — Transfer of Care (Signed)
Immediate Anesthesia Transfer of Care Note  Patient: SHAMIRA TOUTANT  Procedure(s) Performed: Procedure(s) with comments: LEFT AXILLARY LYMPH NODE BIOPSY (Left) - RNFA  Patient Location: PACU  Anesthesia Type:General  Level of Consciousness: awake and patient cooperative  Airway & Oxygen Therapy: Patient Spontanous Breathing  Post-op Assessment: Report given to RN and Post -op Vital signs reviewed and stable  Post vital signs: Reviewed and stable  Last Vitals:  Vitals:   01/28/17 0949  BP: (!) 176/72  Pulse: 89  Resp: 20  Temp: 36.7 C  SpO2: 97%    Last Pain:  Vitals:   01/28/17 0949  TempSrc: Oral      Patients Stated Pain Goal: 3 (59/45/85 [id number])  Complications: No apparent anesthesia complications

## 2017-01-28 NOTE — Interval H&P Note (Signed)
**Note Rhonda-Identified via Obfuscation** History and Physical Interval Note:  01/28/2017 10:56 AM  Rhonda Conrad  has presented today for surgery, with the diagnosis of enlarged left axillary lymph node  The various methods of treatment have been discussed with the patient and family. After consideration of risks, benefits and other options for treatment, the patient has consented to  Procedure(s) with comments: LEFT AXILLARY LYMPH NODE BIOPSY (Left) - RNFA as a surgical intervention .  The patient's history has been reviewed, patient examined, no change in status, stable for surgery.  I have reviewed the patient's chart and labs.  Questions were answered to the patient's satisfaction.     TOTH III,Ivoree Felmlee S

## 2017-01-28 NOTE — H&P (Signed)
**Note Rhonda-Identified via Obfuscation** Rhonda Conrad  Location: [facility] Surgery Patient #: [id number] DOB: April 24, 1932 Single / Language: Rhonda Conrad / Race: Black or African American Female   History of Present Illness The patient is a 83 year old female who presents with a complaint of Mass. We are asked to see the patient in consultation by Dr. Shirline Frees to evaluate her for an enlarged lymph node in the left axilla. The patient is a 83 year old black female who recently went for a routine screening mammogram. At that time she was found to have an enlarged lymph node in the left axilla. This was biopsied and came back as normal lymph node tissue. The lymph node measured approximately 6 cm. She denies any aches or pains. She denies any unintentional weight loss. She otherwise feels normal.   Diagnostic Studies History Pap Smear  >5 years ago  Allergies  Penicillins  Itching.  Medication History Oxycodone-Acetaminophen (7.5-325MG  Tablet, Oral) Active. Acetaminophen (500MG  Tablet, Oral) Active. Colchicine (0.6MG  Tablet, Oral) Active. Voltaren (External) Specific strength unknown - Active. Insulin Glargine (Subcutaneous) Specific strength unknown - Active. Metoprolol Tartrate (50MG  Tablet, Oral) Active. Simvastatin (20MG  Tablet, Oral) Active. SITagliptin Phosphate (Oral) Specific strength unknown - Active. Janumet (50-1000MG  Tablet, Oral) Active.  Social History Alcohol use  Moderate alcohol use. Caffeine use  Tea. Illicit drug use  Remotely quit drug use. Tobacco use  Former smoker.  Family History  Alcohol Abuse  Brother.  Pregnancy / Birth History Age of menopause  66-50 Gravida  1 Maternal age  13-30 Para  1  Other Problems Arthritis  Back Pain  Diabetes Mellitus  High blood pressure  Hypercholesterolemia     Review of Systems  General Present- Weight Loss. Not Present- Appetite Loss, Chills, Fatigue, Fever, Night Sweats and Weight Gain. HEENT Present-  Wears glasses/contact lenses. Not Present- Earache, Hearing Loss, Hoarseness, Nose Bleed, Oral Ulcers, Ringing in the Ears, Seasonal Allergies, Sinus Pain, Sore Throat, Visual Disturbances and Yellow Eyes. Respiratory Not Present- Bloody sputum, Chronic Cough, Difficulty Breathing, Snoring and Wheezing. Cardiovascular Present- Swelling of Extremities. Not Present- Chest Pain, Difficulty Breathing Lying Down, Leg Cramps, Palpitations, Rapid Heart Rate and Shortness of Breath. Gastrointestinal Present- Nausea. Not Present- Abdominal Pain, Bloating, Bloody Stool, Change in Bowel Habits, Chronic diarrhea, Constipation, Difficulty Swallowing, Excessive gas, Gets full quickly at meals, Hemorrhoids, Indigestion, Rectal Pain and Vomiting. Female Genitourinary Present- Nocturia. Not Present- Frequency, Painful Urination, Pelvic Pain and Urgency. Musculoskeletal Present- Joint Stiffness and Muscle Pain. Not Present- Back Pain, Joint Pain, Muscle Weakness and Swelling of Extremities. Neurological Present- Headaches. Not Present- Decreased Memory, Fainting, Numbness, Seizures, Tingling, Tremor, Trouble walking and Weakness. Psychiatric Present- Anxiety. Not Present- Bipolar, Change in Sleep Pattern, Depression, Fearful and Frequent crying.  Vitals  Weight: 189 lb Height: 65in Body Surface Area: 1.93 m Body Mass Index: 31.45 kg/m  Temp.: 86F  Pulse: 106 (Regular)  BP: 150/80 (Sitting, Left Arm, Standard)       Physical Exam  General Mental Status-Alert. General Appearance-Consistent with stated age. Hydration-Well hydrated. Voice-Normal.  Head and Neck Head-normocephalic, atraumatic with no lesions or palpable masses. Trachea-midline. Thyroid Gland Characteristics - normal size and consistency.  Eye Eyeball - Bilateral-Extraocular movements intact. Sclera/Conjunctiva - Bilateral-No scleral icterus.  Chest and Lung Exam Chest and lung exam reveals -quiet, even  and easy respiratory effort with no use of accessory muscles and on auscultation, normal breath sounds, no adventitious sounds and normal vocal resonance. Inspection Chest Wall - Normal. Back - normal.  Breast Note: There is no palpable  mass in either breast. There is an enlarged palpable lymph node in the left axilla. It is difficult to tell if this is fixed or mobile.   Cardiovascular Cardiovascular examination reveals -normal heart sounds, regular rate and rhythm with no murmurs and normal pedal pulses bilaterally.  Abdomen Inspection Inspection of the abdomen reveals - No Hernias. Skin - Scar - no surgical scars. Palpation/Percussion Palpation and Percussion of the abdomen reveal - Soft, Non Tender, No Rebound tenderness, No Rigidity (guarding) and No hepatosplenomegaly. Auscultation Auscultation of the abdomen reveals - Bowel sounds normal.  Neurologic Neurologic evaluation reveals -alert and oriented x 3 with no impairment of recent or remote memory. Mental Status-Normal.  Musculoskeletal Normal Exam - Left-Upper Extremity Strength Normal and Lower Extremity Strength Normal. Normal Exam - Right-Upper Extremity Strength Normal and Lower Extremity Strength Normal.  Lymphatic Head & Neck  General Head & Neck Lymphatics: Bilateral - Description - Normal. Axillary  General Axillary Region: Bilateral - Description - Normal. Tenderness - Non Tender. Femoral & Inguinal  Generalized Femoral & Inguinal Lymphatics: Bilateral - Description - Normal. Tenderness - Non Tender.    Assessment & Plan ENLARGED LYMPH NODES IN ARMPIT (R59.0) Impression: The patient appears to have a large lymph node measuring about 6 cm in the left axilla. Needle biopsy of this was benign. There is concern for a more serious process so we have been asked for an excisional biopsy of the lymph node. I have discussed with her in detail the risks and benefits of the operation as well as some of the  tendon was pexed and she understands and wishes to proceed.

## 2017-01-28 NOTE — Anesthesia Postprocedure Evaluation (Signed)
Anesthesia Post Note  Patient: Rhonda Conrad  Procedure(s) Performed: Procedure(s) (LRB): LEFT AXILLARY LYMPH NODE BIOPSY (Left)     Patient location during evaluation: PACU Anesthesia Type: General Level of consciousness: awake and alert Pain management: pain level controlled Vital Signs Assessment: post-procedure vital signs reviewed and stable Respiratory status: spontaneous breathing, nonlabored ventilation, respiratory function stable and patient connected to nasal cannula oxygen Cardiovascular status: blood pressure returned to baseline and stable Postop Assessment: no signs of nausea or vomiting Anesthetic complications: no    Last Vitals:  Vitals:   01/28/17 0949  BP: (!) 176/72  Pulse: 89  Resp: 20  Temp: 36.7 C  SpO2: 97%    Last Pain:  Vitals:   01/28/17 0949  TempSrc: Oral                 Johnnette Laux

## 2017-01-28 NOTE — Anesthesia Procedure Notes (Signed)
Procedure Name: LMA Insertion Date/Time: 01/28/2017 12:34 PM Performed by: Lance Coon Pre-anesthesia Checklist: Patient identified, Emergency Drugs available, Suction available, Patient being monitored and Timeout performed Patient Re-evaluated:Patient Re-evaluated prior to induction Oxygen Delivery Method: Circle system utilized Preoxygenation: Pre-oxygenation with 100% oxygen Induction Type: IV induction LMA: LMA inserted LMA Size: 4.0 Number of attempts: 1 Placement Confirmation: breath sounds checked- equal and bilateral and positive ETCO2 Tube secured with: Tape Dental Injury: Teeth and Oropharynx as per pre-operative assessment

## 2017-01-28 NOTE — Op Note (Signed)
**Note Rhonda-Identified via Obfuscation** 01/28/2017  1:56 PM  PATIENT:  Rhonda Conrad  83 y.o. female  PRE-OPERATIVE DIAGNOSIS:  Enlarged Left Axillary Lymph Node  POST-OPERATIVE DIAGNOSIS:  Enlarged Left Axillary Lymph Node  PROCEDURE:  Procedure(s) with comments: DEEP LEFT AXILLARY LYMPH NODE BIOPSY   SURGEON:  Surgeon(s) and Role:    * Jovita Kussmaul, MD - Primary    * Judeth Horn, MD - Assisting  PHYSICIAN ASSISTANT:   ASSISTANTS: Judyann Munson, RNFA   ANESTHESIA:   local and general  EBL:  Total I/O In: 1000 [I.V.:1000] Out: -   BLOOD ADMINISTERED:none  DRAINS: none   LOCAL MEDICATIONS USED:  MARCAINE     SPECIMEN:  Source of Specimen:  left axillary lymph node  DISPOSITION OF SPECIMEN:  PATHOLOGY  COUNTS:  YES  TOURNIQUET:  * No tourniquets in log *  DICTATION: .Dragon Dictation   After informed consent was obtained the patient was brought to the operating room and placed in the supine position on the operating room table. After adequate induction of general anesthesia the patient's left breast and axillary area were prepped with ChloraPrep, allowed to dry, and draped in usual sterile manner. An appropriate timeout was performed. A transversely oriented incision was made across the left axilla. The incision was carried through the skin and subcutaneous tissue sharply with the electrocautery until the deep left axillary space was entered. The dissection was carried superiorly until we were able to identify the left axillary vein. The dissection was then carried medially and laterally until the serratus muscle the chest wall and the latissimus muscle were identified to delineate the boundaries of the axilla. There were a couple firm enlarged lymph nodes that were identified and these were excised sharply with electrocautery and the lymphatics were controlled with clips. We were also able to then identify the large left axillary mass that was seen on her previous scans. The mass was quite large and  seemed to involve the left axillary vein. The mass continued above the left axillary vein. The mass looked either cystic or vascular but did not appear to be a firm enlarged lymph node. After close examination I felt like the mass was not likely to be resectable without causing significant morbidity to the left arm. We already had a couple abnormal appearing lymph nodes in the vicinity that could give [place] a diagnosis. I also got intraoperative opinions from vascular surgery as well as one of my partners who agreed. At this point we stopped the dissection. The area was examined and found to be hemostatic. The deep layer of the wound was closed with interrupted 3-0 Vicryl stitches. The skin was then closed with a running 4-0 Monocryl subcuticular stitch. Dermabond dressings were applied. The patient tolerated the procedure well. At the end of the case all needle sponge and instrument counts were correct. The patient was then awakened and taken to recovery in stable condition.  PLAN OF CARE: Discharge to home after PACU  PATIENT DISPOSITION:  PACU - hemodynamically stable.   Delay start of Pharmacological VTE agent (>24hrs) due to surgical blood loss or risk of bleeding: not applicable

## 2017-01-29 ENCOUNTER — Encounter (HOSPITAL_COMMUNITY): Payer: Self-pay | Admitting: General Surgery

## 2017-01-30 NOTE — Anesthesia Preprocedure Evaluation (Signed)
Anesthesia Evaluation  Patient identified by MRN, date of birth, ID band Patient awake    Reviewed: Allergy & Precautions, NPO status , Patient's Chart, lab work & pertinent test results  Airway Mallampati: I  TM Distance: >3 FB Neck ROM: Full    Dental   Pulmonary former smoker,    Pulmonary exam normal        Cardiovascular hypertension, Pt. on medications Normal cardiovascular exam     Neuro/Psych Anxiety    GI/Hepatic   Endo/Other  diabetes, Type 2, Insulin Dependent  Renal/GU      Musculoskeletal   Abdominal   Peds  Hematology   Anesthesia Other Findings   Reproductive/Obstetrics                             Anesthesia Physical Anesthesia Plan  ASA: III  Anesthesia Plan: General   Post-op Pain Management:    Induction: Intravenous  PONV Risk Score and Plan: 3 and Ondansetron, Dexamethasone, Midazolam and Treatment may vary due to age or medical condition  Airway Management Planned: LMA  Additional Equipment:   Intra-op Plan:   Post-operative Plan: Extubation in OR  Informed Consent: I have reviewed the patients History and Physical, chart, labs and discussed the procedure including the risks, benefits and alternatives for the proposed anesthesia with the patient or authorized representative who has indicated his/her understanding and acceptance.     Plan Discussed with: CRNA and Surgeon  Anesthesia Plan Comments:         Anesthesia Quick Evaluation

## 2017-02-13 ENCOUNTER — Other Ambulatory Visit: Payer: Self-pay | Admitting: General Surgery

## 2017-02-13 DIAGNOSIS — R590 Localized enlarged lymph nodes: Secondary | ICD-10-CM

## 2017-02-26 ENCOUNTER — Ambulatory Visit
Admission: RE | Admit: 2017-02-26 | Discharge: 2017-02-26 | Disposition: A | Discharge status: Home / Self Care | Payer: Medicare Other | Source: Ambulatory Visit | Attending: General Surgery | Admitting: General Surgery

## 2017-02-26 ENCOUNTER — Other Ambulatory Visit: Payer: Self-pay | Admitting: General Surgery

## 2017-02-26 DIAGNOSIS — R590 Localized enlarged lymph nodes: Secondary | ICD-10-CM

## 2017-03-09 ENCOUNTER — Ambulatory Visit
Admission: RE | Admit: 2017-03-09 | Discharge: 2017-03-09 | Disposition: A | Discharge status: Home / Self Care | Payer: Medicare Other | Source: Ambulatory Visit | Attending: General Surgery | Admitting: General Surgery

## 2017-03-09 DIAGNOSIS — R590 Localized enlarged lymph nodes: Secondary | ICD-10-CM

## 2017-03-09 MED ORDER — GADOBENATE DIMEGLUMINE 529 MG/ML IV SOLN
18.0000 mL | Freq: Once | INTRAVENOUS | Status: AC | PRN
  Administered 2017-03-09: 18 mL via INTRAVENOUS

## 2017-03-11 ENCOUNTER — Other Ambulatory Visit: Payer: Self-pay | Admitting: General Surgery

## 2017-03-11 ENCOUNTER — Inpatient Hospital Stay: Admission: RE | Admit: 2017-03-11 | Payer: Medicare Other | Source: Ambulatory Visit

## 2017-03-11 DIAGNOSIS — R599 Enlarged lymph nodes, unspecified: Secondary | ICD-10-CM

## 2017-03-13 ENCOUNTER — Ambulatory Visit
Admission: RE | Admit: 2017-03-13 | Discharge: 2017-03-13 | Disposition: A | Discharge status: Home / Self Care | Payer: Medicare Other | Source: Ambulatory Visit | Attending: General Surgery | Admitting: General Surgery

## 2017-03-13 DIAGNOSIS — R599 Enlarged lymph nodes, unspecified: Secondary | ICD-10-CM

## 2018-06-09 ENCOUNTER — Encounter (HOSPITAL_COMMUNITY): Payer: Self-pay | Admitting: Emergency Medicine

## 2018-06-09 ENCOUNTER — Emergency Department (HOSPITAL_COMMUNITY)
Admission: EM | Admit: 2018-06-09 | Discharge: 2018-06-09 | Disposition: A | Discharge status: Home / Self Care | Payer: Medicare Other | Attending: Emergency Medicine | Admitting: Emergency Medicine

## 2018-06-09 DIAGNOSIS — I10 Essential (primary) hypertension: Secondary | ICD-10-CM | POA: Insufficient documentation

## 2018-06-09 DIAGNOSIS — Z79899 Other long term (current) drug therapy: Secondary | ICD-10-CM | POA: Diagnosis not present

## 2018-06-09 DIAGNOSIS — Z7982 Long term (current) use of aspirin: Secondary | ICD-10-CM | POA: Insufficient documentation

## 2018-06-09 DIAGNOSIS — Z87891 Personal history of nicotine dependence: Secondary | ICD-10-CM | POA: Diagnosis not present

## 2018-06-09 DIAGNOSIS — R809 Proteinuria, unspecified: Secondary | ICD-10-CM

## 2018-06-09 DIAGNOSIS — G4700 Insomnia, unspecified: Secondary | ICD-10-CM | POA: Diagnosis not present

## 2018-06-09 DIAGNOSIS — Z794 Long term (current) use of insulin: Secondary | ICD-10-CM | POA: Insufficient documentation

## 2018-06-09 DIAGNOSIS — E876 Hypokalemia: Secondary | ICD-10-CM | POA: Diagnosis not present

## 2018-06-09 DIAGNOSIS — E119 Type 2 diabetes mellitus without complications: Secondary | ICD-10-CM | POA: Insufficient documentation

## 2018-06-09 LAB — URINALYSIS, ROUTINE W REFLEX MICROSCOPIC
Bacteria, UA: NONE SEEN
Bilirubin Urine: NEGATIVE
GLUCOSE, UA: NEGATIVE mg/dL
Ketones, ur: NEGATIVE mg/dL
LEUKOCYTES UA: NEGATIVE
Nitrite: NEGATIVE
PH: 6 (ref 5.0–8.0)
PROTEIN: 30 mg/dL — AB
SPECIFIC GRAVITY, URINE: 1.008 (ref 1.005–1.030)

## 2018-06-09 LAB — CBC WITH DIFFERENTIAL/PLATELET
ABS IMMATURE GRANULOCYTES: 0.03 10*3/uL (ref 0.00–0.07)
BASOS PCT: 0 %
Basophils Absolute: 0 10*3/uL (ref 0.0–0.1)
EOS ABS: 0.3 10*3/uL (ref 0.0–0.5)
EOS PCT: 3 %
HCT: 42 % (ref 36.0–46.0)
Hemoglobin: 13.5 g/dL (ref 12.0–15.0)
Immature Granulocytes: 0 %
Lymphocytes Relative: 37 %
Lymphs Abs: 3.6 10*3/uL (ref 0.7–4.0)
MCH: 28.8 pg (ref 26.0–34.0)
MCHC: 32.1 g/dL (ref 30.0–36.0)
MCV: 89.6 fL (ref 80.0–100.0)
MONO ABS: 0.7 10*3/uL (ref 0.1–1.0)
MONOS PCT: 7 %
NEUTROS ABS: 5 10*3/uL (ref 1.7–7.7)
Neutrophils Relative %: 53 %
PLATELETS: 190 10*3/uL (ref 150–400)
RBC: 4.69 MIL/uL (ref 3.87–5.11)
RDW: 13.4 % (ref 11.5–15.5)
WBC: 9.7 10*3/uL (ref 4.0–10.5)
nRBC: 0 % (ref 0.0–0.2)

## 2018-06-09 LAB — I-STAT CHEM 8, ED
BUN: 17 mg/dL (ref 8–23)
CALCIUM ION: 1.18 mmol/L (ref 1.15–1.40)
CHLORIDE: 105 mmol/L (ref 98–111)
CREATININE: 0.8 mg/dL (ref 0.44–1.00)
GLUCOSE: 100 mg/dL — AB (ref 70–99)
HCT: 38 % (ref 36.0–46.0)
Hemoglobin: 12.9 g/dL (ref 12.0–15.0)
Potassium: 3.3 mmol/L — ABNORMAL LOW (ref 3.5–5.1)
Sodium: 141 mmol/L (ref 135–145)
TCO2: 25 mmol/L (ref 22–32)

## 2018-06-09 LAB — COMPREHENSIVE METABOLIC PANEL
ALK PHOS: 50 U/L (ref 38–126)
ALT: 12 U/L (ref 0–44)
ANION GAP: 8 (ref 5–15)
AST: 18 U/L (ref 15–41)
Albumin: 3.4 g/dL — ABNORMAL LOW (ref 3.5–5.0)
BUN: 16 mg/dL (ref 8–23)
CALCIUM: 9.3 mg/dL (ref 8.9–10.3)
CHLORIDE: 108 mmol/L (ref 98–111)
CO2: 22 mmol/L (ref 22–32)
Creatinine, Ser: 0.9 mg/dL (ref 0.44–1.00)
GFR calc non Af Amer: 59 mL/min — ABNORMAL LOW (ref >60)
Glucose, Bld: 103 mg/dL — ABNORMAL HIGH (ref 70–99)
Potassium: 3.5 mmol/L (ref 3.5–5.1)
Sodium: 138 mmol/L (ref 135–145)
TOTAL PROTEIN: 7 g/dL (ref 6.5–8.1)
Total Bilirubin: 0.2 mg/dL — ABNORMAL LOW (ref 0.3–1.2)

## 2018-06-09 MED ORDER — AMLODIPINE BESYLATE 5 MG PO TABS
10.0000 mg | ORAL_TABLET | Freq: Once | ORAL | Status: AC
  Administered 2018-06-09: 10 mg via ORAL
  Filled 2018-06-09: qty 2

## 2018-06-09 MED ORDER — TRIAMTERENE-HCTZ 37.5-25 MG PO TABS: 1.0000 | ORAL_TABLET | Freq: Every day | ORAL | Status: DC

## 2018-06-09 MED ORDER — LOSARTAN POTASSIUM 50 MG PO TABS
100.0000 mg | ORAL_TABLET | Freq: Once | ORAL | Status: AC
  Administered 2018-06-09: 100 mg via ORAL
  Filled 2018-06-09: qty 2

## 2018-06-09 MED ORDER — TRIAMTERENE-HCTZ 37.5-25 MG PO TABS
1.0000 | ORAL_TABLET | Freq: Once | ORAL | Status: AC
  Administered 2018-06-09: 1 via ORAL
  Filled 2018-06-09: qty 1

## 2018-06-09 NOTE — Discharge Instructions (Addendum)
You had mild protein in your urine, but your kidney function was normal. It is important you discuss with with your doctor.  Take your medication as prescribed, however do not take your morning blood pressure medicines today, as you had those while you are in the ER. It is important that you are on a regular sleep schedule, as staying up all night may cause your blood pressure to become abnormal. There is information about foods with high potassium, make sure you are eating foods with lots of potassium, as it was mildly low today.  Follow-up with your doctor tomorrow at your scheduled appointment for further conversation and management of your blood pressure. Return to the emergency room if you develop severe headache, vision loss, slurred speech, numbness/tingling/weakness, chest pain, or any new, worsening, concerning symptoms.

## 2018-06-09 NOTE — ED Triage Notes (Signed)
Pt transported from home by EMS with c/o HTN, pt reports taking her BP multiple times over night with reading above 200. Pt denies any symptoms.

## 2018-06-09 NOTE — ED Provider Notes (Signed)
[facility] EMERGENCY DEPARTMENT Provider Note   CSN: [id number] Arrival date & time: 06/09/18  0636     History   Chief Complaint Chief Complaint  Patient presents with  . Hypertension    HPI Rhonda Conrad is a 85 y.o. female presenting for evaluation of hypertension.  Patient states she checks her blood pressure frequently.  Around 2:00 this morning, she checked her blood pressure and was elevated over 200. She took one of her BP meds, but she is not sure which. BP improved for 784 systolic, but then returned to >200.  Patient states she takes most of her blood pressure medicine in the morning around 10:00, and 1 of them at night.  She is not sure which when she takes at night.  Patient states that she slept a lot yesterday, due to staying up late the night before.  As such, she did not sleep last night.  She denies headache, vision changes, slurred speech, chest pain, shortness of breath, numbness/weakness, nausea, abdominal pain, urinary symptoms, normal bowel movements.  Patient states she vomited once, but has no nausea currently.  She reports a history of diabetes and hypertension, no other medical problems.  She denies change in her medications recently. Pt has apt with PCP tomorrow.   Additional history obtained from chart review, pt is on 3 different BP meds. Recently diagnosed with enlarged L axillary lymph node, which was found to be benign. H/o cardiac cath in 2015, which showed minimal/mild stenosis.   HPI  Past Medical History:  Diagnosis Date  . Anxiety   . Arthritis   . Diabetes mellitus without complication ([place])   . Headache   . High cholesterol   . Hypertension     Patient Active Problem List   Diagnosis Date Noted  . Anxiety state, unspecified 06/14/2013  . Chest pain 06/13/2013  . DM (diabetes mellitus) ([place]) 06/13/2013  . Hypertension 06/13/2013  . Hyperlipidemia 06/13/2013    Past Surgical History:  Procedure Laterality Date    . AXILLARY LYMPH NODE BIOPSY Left 01/28/2017   Procedure: LEFT AXILLARY LYMPH NODE BIOPSY;  Surgeon: Jovita Kussmaul, MD;  Location: [facility];  Service: General;  Laterality: Left;  RNFA  . CARDIAC CATHETERIZATION    . LEFT HEART CATHETERIZATION WITH CORONARY ANGIOGRAM N/A 06/15/2013   Procedure: LEFT HEART CATHETERIZATION WITH CORONARY ANGIOGRAM;  Surgeon: Clent Demark, MD;  Location: [facility] CATH LAB;  Service: Cardiovascular;  Laterality: N/A;  . vaginal polyp removal       OB History   No obstetric history on file.      Home Medications    Prior to Admission medications   Medication Sig Start Date End Date Taking? Authorizing Provider  acetaminophen (TYLENOL) 500 MG tablet Take 500 mg by mouth every 8 (eight) hours as needed for mild pain or headache.     [provider]  ALPRAZolam Duanne Moron) 0.5 MG tablet Take 0.5 mg by mouth 3 (three) times daily as needed for anxiety.  02/12/14   [provider]  amLODipine (NORVASC) 10 MG tablet Take 10 mg by mouth daily.    [provider]  aspirin EC 81 MG EC tablet Take 1 tablet (81 mg total) by mouth daily. 06/15/13   Delfina Redwood, MD  benzonatate (TESSALON) 100 MG capsule Take 100 mg by mouth 3 (three) times daily as needed for cough.    [provider]  busPIRone (BUSPAR) 7.5 MG tablet Take 7.5 mg by mouth  2 (two) times daily.    [provider]  colchicine (COLCRYS) 0.6 MG tablet Take 0.6 mg by mouth 2 (two) times daily as needed (gout).     [provider]  diclofenac sodium (VOLTAREN) 1 % GEL Apply 2 g topically 2 (two) times daily as needed (arthritic pain).    [provider]  HYDROcodone-acetaminophen (NORCO/VICODIN) 5-325 MG tablet Take 1-2 tablets by mouth every 4 (four) hours as needed for moderate pain or severe pain. 01/28/17   Autumn Messing III, MD  insulin glargine (LANTUS) 100 UNIT/ML injection Inject 34 Units into the skin 2 (two) times daily.     [provider]   losartan (COZAAR) 100 MG tablet Take 100 mg by mouth daily 02/07/16   [provider]  ondansetron (ZOFRAN) 8 MG tablet Take 8 mg by mouth twice daily as needed for nausea 11/19/16   [provider]  oxyCODONE-acetaminophen (PERCOCET) 7.5-325 MG per tablet Take 0.5-1 tablets by mouth 2 (two) times daily as needed for severe pain.     [provider]  pantoprazole (PROTONIX) 40 MG tablet Take 40 mg by mouth 2 (two) times daily as needed (heartburn).     [provider]  simvastatin (ZOCOR) 20 MG tablet Take 20 mg by mouth every evening.    [provider]  sitaGLIPtin-metformin (JANUMET) 50-500 MG per tablet Take 1 tablet by mouth daily.    [provider]  tiZANidine (ZANAFLEX) 2 MG tablet Take 2 mg by mouth at bedtime as needed for muscle pain 11/05/16   [provider]  triamterene-hydrochlorothiazide (MAXZIDE-25) 37.5-25 MG tablet Take 1 tablet by mouth in the morning 11/05/16   [provider]    Family History No family history on file.  Social History Social History   Tobacco Use  . Smoking status: Former Research scientist (life sciences)  . Smokeless tobacco: Never Used  Substance Use Topics  . Alcohol use: No  . Drug use: No     Allergies   Penicillins   Review of Systems Review of Systems  Respiratory: Negative for cough and shortness of breath.   Cardiovascular: Negative for chest pain.  Gastrointestinal: Positive for vomiting. Negative for abdominal pain and nausea.  Neurological: Negative for dizziness, tremors, syncope, speech difficulty, weakness, light-headedness, numbness and headaches.  All other systems reviewed and are negative.    Physical Exam Updated Vital Signs BP (!) 161/68   Pulse (!) 51   Temp 98.1 F (36.7 C) (Oral)   Resp 14   Ht 5\' 2"  (1.575 m)   Wt 81.6 kg   SpO2 99%   BMI 32.92 kg/m   Physical Exam Vitals signs and nursing note reviewed.  Constitutional:      General: She is not in acute  distress.    Appearance: She is well-developed.     Comments: Elderly female resting comfortably in the bed in NAD  HENT:     Head: Normocephalic and atraumatic.  Eyes:     Conjunctiva/sclera: Conjunctivae normal.     Pupils: Pupils are equal, round, and reactive to light.  Neck:     Musculoskeletal: Normal range of motion and neck supple.  Cardiovascular:     Rate and Rhythm: Normal rate and regular rhythm.  Pulmonary:     Effort: Pulmonary effort is normal. No respiratory distress.     Breath sounds: Normal breath sounds. No wheezing.  Abdominal:     General: Bowel sounds are normal. There is no distension.  Palpations: Abdomen is soft. There is no mass.     Tenderness: There is no abdominal tenderness. There is no guarding or rebound.     Comments: No ttp of the abd. Soft without rigidity, guarding, or distention.   Musculoskeletal: Normal range of motion.     Right lower leg: No edema.     Left lower leg: No edema.     Comments: No leg swelling. radial and pedal pulses intact bilaterally. Strength of upper and lower extremities intact bilaterally.   Skin:    General: Skin is warm and dry.     Capillary Refill: Capillary refill takes less than 2 seconds.  Neurological:     General: No focal deficit present.     Mental Status: She is alert and oriented to person, place, and time.     GCS: GCS eye subscore is 4. GCS verbal subscore is 5. GCS motor subscore is 6.     Sensory: Sensation is intact.     Motor: Motor function is intact.     Coordination: Coordination is intact.     Comments: No obvious neurologic deficits  Psychiatric:        Mood and Affect: Mood normal.      ED Treatments / Results  Labs (all labs ordered are listed, but only abnormal results are displayed) Labs Reviewed  COMPREHENSIVE METABOLIC PANEL - Abnormal; Notable for the following components:      Result Value   Glucose, Bld 103 (*)    Albumin 3.4 (*)    Total Bilirubin 0.2 (*)    GFR calc  non Af Amer 59 (*)    All other components within normal limits  URINALYSIS, ROUTINE W REFLEX MICROSCOPIC - Abnormal; Notable for the following components:   Color, Urine STRAW (*)    Hgb urine dipstick SMALL (*)    Protein, ur 30 (*)    All other components within normal limits  I-STAT CHEM 8, ED - Abnormal; Notable for the following components:   Potassium 3.3 (*)    Glucose, Bld 100 (*)    All other components within normal limits  CBC WITH DIFFERENTIAL/PLATELET  CBG MONITORING, ED    EKG None  Radiology No results found.  Procedures Procedures (including critical care time)  Medications Ordered in ED Medications  amLODipine (NORVASC) tablet 10 mg (10 mg Oral Given 06/09/18 0724)  losartan (COZAAR) tablet 100 mg (100 mg Oral Given 06/09/18 0807)  triamterene-hydrochlorothiazide (MAXZIDE-25) 37.5-25 MG per tablet 1 tablet (1 tablet Oral Given 06/09/18 0724)     Initial Impression / Assessment and Plan / ED Course  I have reviewed the triage vital signs and the nursing notes.  Pertinent labs & imaging results that were available during my care of the patient were reviewed by me and considered in my medical decision making (see chart for details).     Patient presenting for evaluation of hypertension.  Physical exam reassuring, she appears nontoxic.  She denies chest pain, headache, numbness/weakness.  No obvious signs of endorgan damage.  Additionally, patient has not taken her morning blood pressure medicines, and has been awake all night.  This is likely contributing to her hypertension.  She is a 83 and had one episode of vomiting, will obtain basic labs.  Will give home blood pressure medications and reassess.  Case discussed with attending, Dr. Roxanne Mins evaluated the patient.  Labs reassuring, no leukocytosis.  Hemoglobin stable.  CMP delayed due to lab issues, therefore i-STAT Chem-8 ordered.  UA  pending.  Blood pressure improving, patient remains symptom-free.  Chem-8 shows  mild hypokalemia at 3.3.  Discussed with patient and importance of eating high potassium foods.  UA with 30 protein, but creatinine stable.  No obvious AKI.  Patient has appointment with her PCP tomorrow, will have her follow-up to discuss her blood pressure.  Discussed findings and plan with patient and granddaughter.  At this time, patient appears safe for discharge.  Return precautions given.  Patient states she understands and agrees to plan.   Final Clinical Impressions(s) / ED Diagnoses   Final diagnoses:  Hypertension, unspecified type  Proteinuria, unspecified type  Hypokalemia    ED Discharge Orders    None       Franchot Heidelberg, PA-C 18/86/77 [id number]    Delora Fuel, MD 68/15/94 2237

## 2018-10-02 IMAGING — US US THYROID
1 series · 13 of 25 positions shown · non-contrast
Comparison: 11/12/2016

CLINICAL DATA: Thyroid nodule by CT

EXAM:
THYROID ULTRASOUND
TECHNIQUE: Ultrasound examination of the thyroid gland and adjacent soft
tissues was performed.

[Series 1: us thyroid · 0.07mm/px · 13 of 48 slices shown]
[im 1/48]
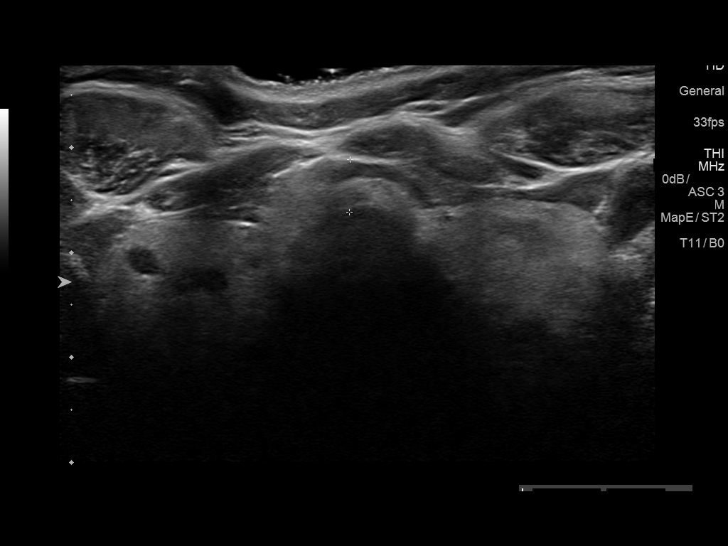
[im 4/48]
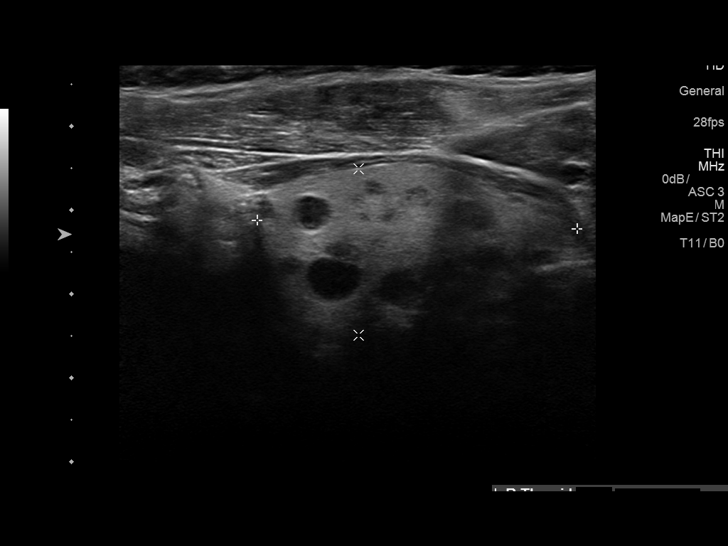
[im 8/48]
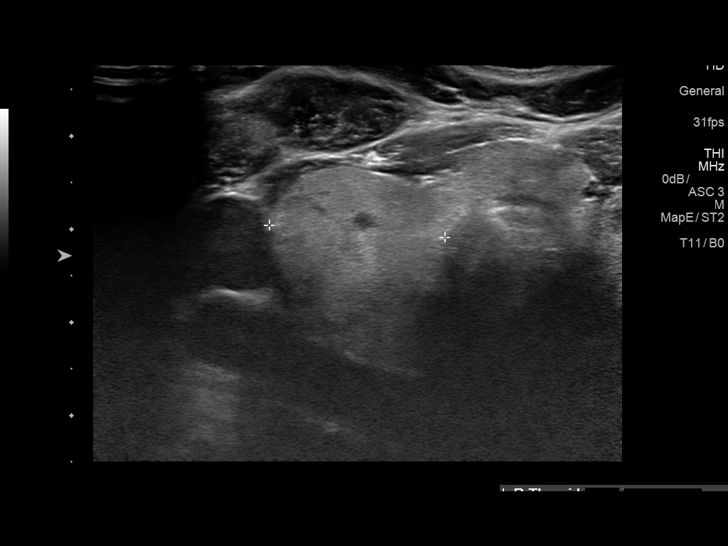
[im 12/48]
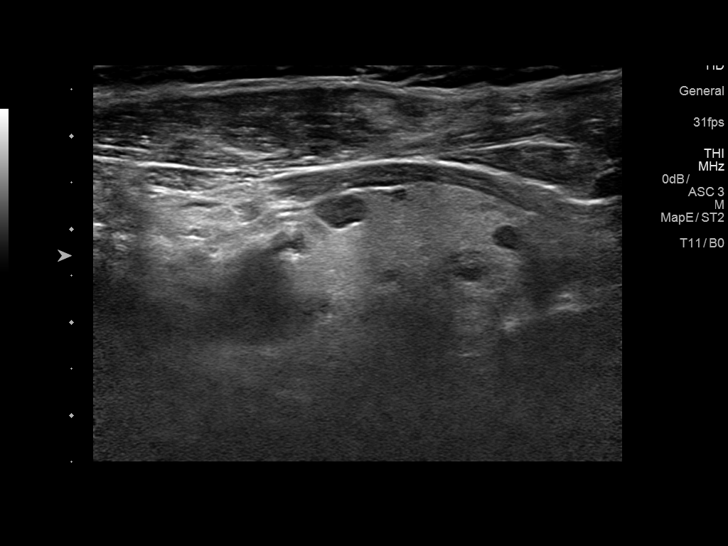
[im 16/48]
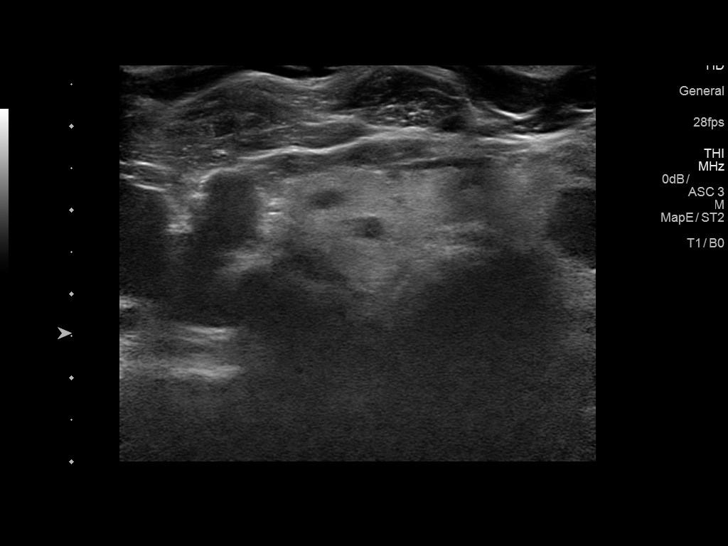
[im 20/48]
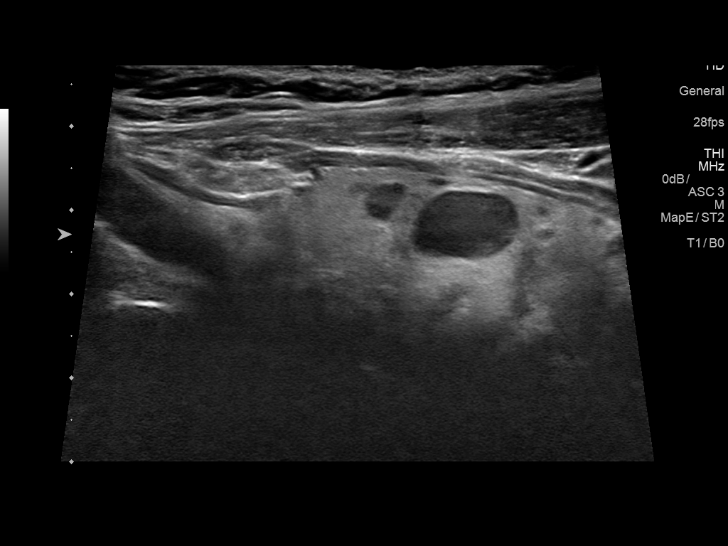
[im 24/48]
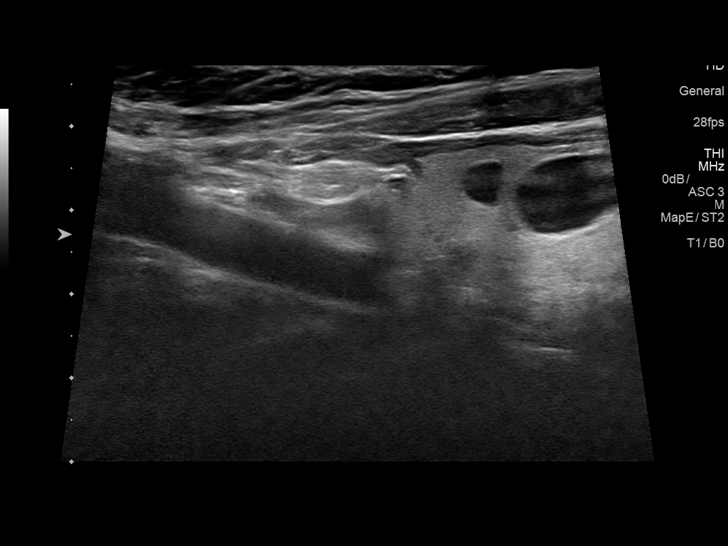
[im 28/48]
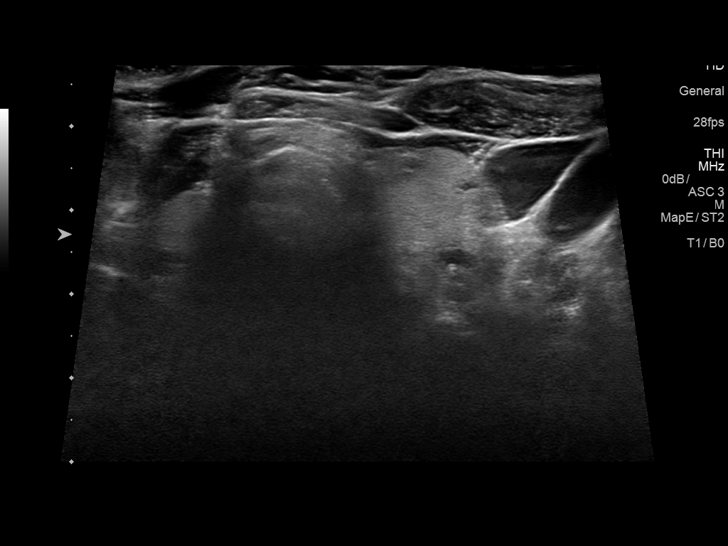
[im 32/48]
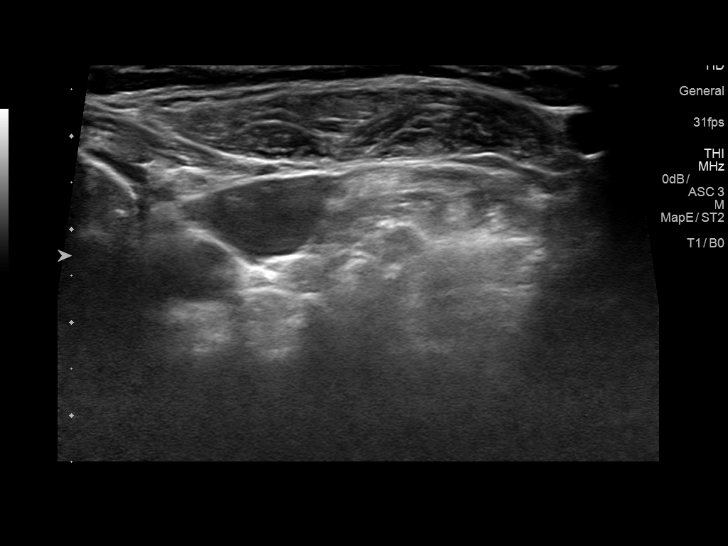
[im 36/48]
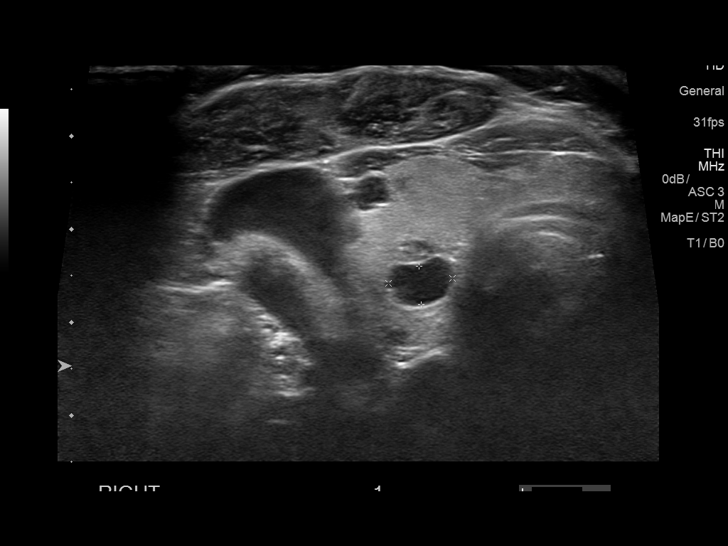
[im 40/48]
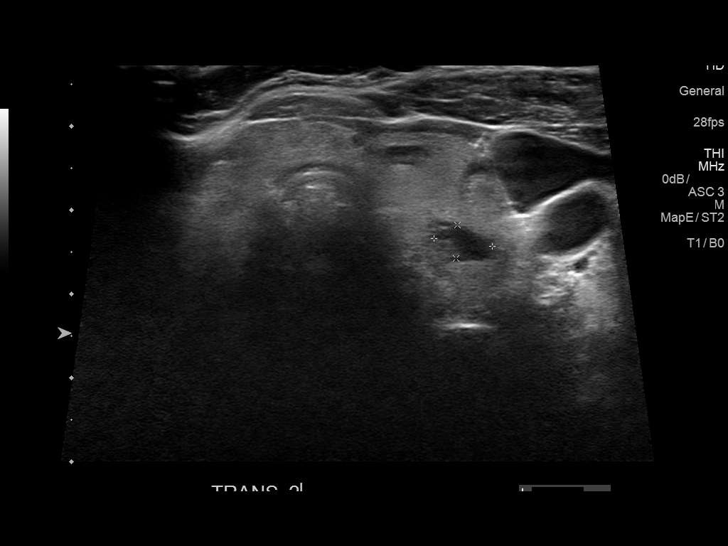
[im 44/48]
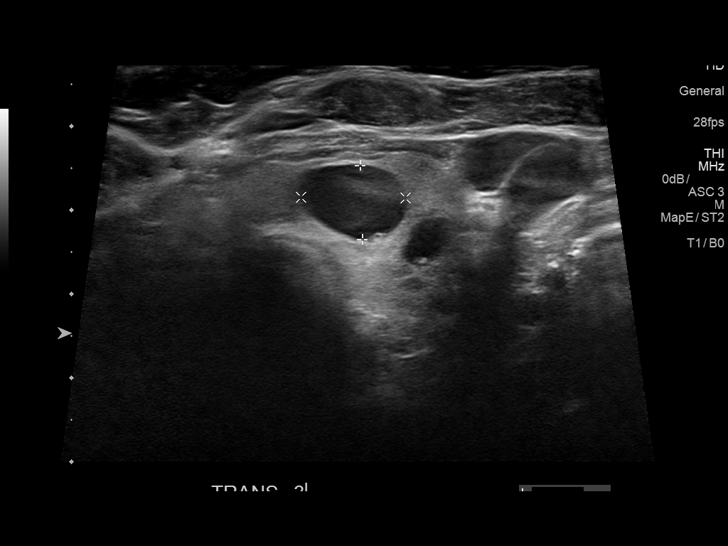
[im 48/48]
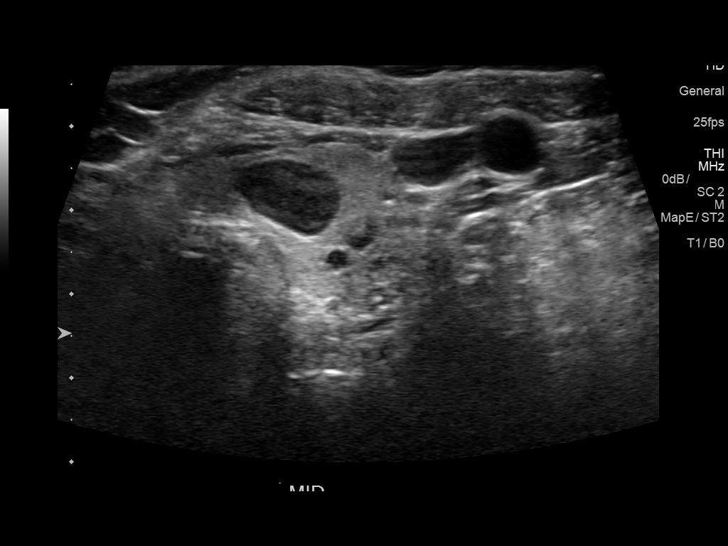

[13 of 25 positions shown; findings below may reference images not displayed]

FINDINGS: Parenchymal Echotexture: Moderately heterogenous

Isthmus: 5 mm

Right lobe: 3.8 x 2.0 x 1.9 cm

Left lobe: 4.1 x 1.8 x 1.8 cm

_________________________________________________________

Estimated total number of nodules >/= 1 cm: 1

Number of spongiform nodules >/=  2 cm not described below (TR1): 0

Number of mixed cystic and solid nodules >/= 1.5 cm not described
below (TR2): 0

_________________________________________________________

Nodule # 1, labeled 3 on the worksheet:

Location: Left; Mid

Maximum size: 1.4 cm; Other 2 dimensions: 0.9 x 1.3 cm

Composition: cystic/almost completely cystic (0)

Echogenicity: anechoic (0)

Shape: not taller-than-wide (0)

Margins: smooth (0)

Echogenic foci: none (0)

ACR TI-RADS total points: 0.

ACR TI-RADS risk category: TR1 (0-1 points).

ACR TI-RADS recommendations:

This nodule does NOT meet TI-RADS criteria for biopsy or dedicated
follow-up.

There are additional numerous bilateral subcentimeter cystic thyroid
nodules all measuring 7 mm or less in size. These also does not meet
criteria for follow-up or biopsy.

_________________________________________________________
IMPRESSION: 1.4 cm left mid pole thyroid cystic nodule (TR 1 nodule). This
nodule does not meet criteria for biopsy or follow-up. This
correlates with the CT finding from 11/12/2016.

The above is in keeping with the ACR TI-RADS recommendations - [HOSPITAL] 6605;[DATE].

## 2020-06-20 DIAGNOSIS — I10 Essential (primary) hypertension: Secondary | ICD-10-CM | POA: Diagnosis not present

## 2020-06-20 DIAGNOSIS — E7800 Pure hypercholesterolemia, unspecified: Secondary | ICD-10-CM | POA: Diagnosis not present

## 2020-06-20 DIAGNOSIS — E119 Type 2 diabetes mellitus without complications: Secondary | ICD-10-CM | POA: Diagnosis not present

## 2020-06-20 DIAGNOSIS — E1165 Type 2 diabetes mellitus with hyperglycemia: Secondary | ICD-10-CM | POA: Diagnosis not present

## 2020-06-20 DIAGNOSIS — N183 Chronic kidney disease, stage 3 unspecified: Secondary | ICD-10-CM | POA: Diagnosis not present

## 2020-06-20 DIAGNOSIS — M150 Primary generalized (osteo)arthritis: Secondary | ICD-10-CM | POA: Diagnosis not present

## 2020-07-03 DIAGNOSIS — I10 Essential (primary) hypertension: Secondary | ICD-10-CM | POA: Diagnosis not present

## 2020-07-04 DIAGNOSIS — I10 Essential (primary) hypertension: Secondary | ICD-10-CM | POA: Diagnosis not present

## 2020-07-11 ENCOUNTER — Other Ambulatory Visit: Payer: Self-pay | Admitting: Family Medicine

## 2020-07-11 DIAGNOSIS — Z1231 Encounter for screening mammogram for malignant neoplasm of breast: Secondary | ICD-10-CM

## 2020-07-18 DIAGNOSIS — Z20822 Contact with and (suspected) exposure to covid-19: Secondary | ICD-10-CM | POA: Diagnosis not present

## 2020-07-24 DIAGNOSIS — I10 Essential (primary) hypertension: Secondary | ICD-10-CM | POA: Diagnosis not present

## 2020-07-24 DIAGNOSIS — E119 Type 2 diabetes mellitus without complications: Secondary | ICD-10-CM | POA: Diagnosis not present

## 2020-07-24 DIAGNOSIS — N183 Chronic kidney disease, stage 3 unspecified: Secondary | ICD-10-CM | POA: Diagnosis not present

## 2020-07-24 DIAGNOSIS — E7800 Pure hypercholesterolemia, unspecified: Secondary | ICD-10-CM | POA: Diagnosis not present

## 2020-07-24 DIAGNOSIS — M150 Primary generalized (osteo)arthritis: Secondary | ICD-10-CM | POA: Diagnosis not present

## 2020-07-24 DIAGNOSIS — E1165 Type 2 diabetes mellitus with hyperglycemia: Secondary | ICD-10-CM | POA: Diagnosis not present

## 2020-08-01 DIAGNOSIS — N183 Chronic kidney disease, stage 3 unspecified: Secondary | ICD-10-CM | POA: Diagnosis not present

## 2020-08-01 DIAGNOSIS — E1165 Type 2 diabetes mellitus with hyperglycemia: Secondary | ICD-10-CM | POA: Diagnosis not present

## 2020-08-01 DIAGNOSIS — G894 Chronic pain syndrome: Secondary | ICD-10-CM | POA: Diagnosis not present

## 2020-08-01 DIAGNOSIS — E7800 Pure hypercholesterolemia, unspecified: Secondary | ICD-10-CM | POA: Diagnosis not present

## 2020-08-01 DIAGNOSIS — I10 Essential (primary) hypertension: Secondary | ICD-10-CM | POA: Diagnosis not present

## 2020-08-01 DIAGNOSIS — M150 Primary generalized (osteo)arthritis: Secondary | ICD-10-CM | POA: Diagnosis not present

## 2020-08-11 DIAGNOSIS — D242 Benign neoplasm of left breast: Secondary | ICD-10-CM | POA: Diagnosis not present

## 2020-08-11 DIAGNOSIS — N6320 Unspecified lump in the left breast, unspecified quadrant: Secondary | ICD-10-CM | POA: Diagnosis not present

## 2020-08-11 DIAGNOSIS — D241 Benign neoplasm of right breast: Secondary | ICD-10-CM | POA: Diagnosis not present

## 2020-08-11 DIAGNOSIS — Z1231 Encounter for screening mammogram for malignant neoplasm of breast: Secondary | ICD-10-CM | POA: Diagnosis not present

## 2020-09-20 DIAGNOSIS — N183 Chronic kidney disease, stage 3 unspecified: Secondary | ICD-10-CM | POA: Diagnosis not present

## 2020-09-20 DIAGNOSIS — E119 Type 2 diabetes mellitus without complications: Secondary | ICD-10-CM | POA: Diagnosis not present

## 2020-09-20 DIAGNOSIS — E1165 Type 2 diabetes mellitus with hyperglycemia: Secondary | ICD-10-CM | POA: Diagnosis not present

## 2020-09-20 DIAGNOSIS — M150 Primary generalized (osteo)arthritis: Secondary | ICD-10-CM | POA: Diagnosis not present

## 2020-09-20 DIAGNOSIS — I10 Essential (primary) hypertension: Secondary | ICD-10-CM | POA: Diagnosis not present

## 2020-09-20 DIAGNOSIS — E7800 Pure hypercholesterolemia, unspecified: Secondary | ICD-10-CM | POA: Diagnosis not present

## 2020-09-29 DIAGNOSIS — I10 Essential (primary) hypertension: Secondary | ICD-10-CM | POA: Diagnosis not present

## 2020-10-28 DIAGNOSIS — I10 Essential (primary) hypertension: Secondary | ICD-10-CM | POA: Diagnosis not present

## 2020-12-01 DIAGNOSIS — I10 Essential (primary) hypertension: Secondary | ICD-10-CM | POA: Diagnosis not present

## 2020-12-01 DIAGNOSIS — E1165 Type 2 diabetes mellitus with hyperglycemia: Secondary | ICD-10-CM | POA: Diagnosis not present

## 2020-12-01 DIAGNOSIS — E7800 Pure hypercholesterolemia, unspecified: Secondary | ICD-10-CM | POA: Diagnosis not present

## 2020-12-01 DIAGNOSIS — E119 Type 2 diabetes mellitus without complications: Secondary | ICD-10-CM | POA: Diagnosis not present

## 2020-12-01 DIAGNOSIS — N183 Chronic kidney disease, stage 3 unspecified: Secondary | ICD-10-CM | POA: Diagnosis not present

## 2020-12-01 DIAGNOSIS — M150 Primary generalized (osteo)arthritis: Secondary | ICD-10-CM | POA: Diagnosis not present

## 2020-12-30 DIAGNOSIS — I10 Essential (primary) hypertension: Secondary | ICD-10-CM | POA: Diagnosis not present

## 2020-12-30 DIAGNOSIS — E7800 Pure hypercholesterolemia, unspecified: Secondary | ICD-10-CM | POA: Diagnosis not present

## 2020-12-30 DIAGNOSIS — E119 Type 2 diabetes mellitus without complications: Secondary | ICD-10-CM | POA: Diagnosis not present

## 2020-12-30 DIAGNOSIS — E1165 Type 2 diabetes mellitus with hyperglycemia: Secondary | ICD-10-CM | POA: Diagnosis not present

## 2020-12-30 DIAGNOSIS — N183 Chronic kidney disease, stage 3 unspecified: Secondary | ICD-10-CM | POA: Diagnosis not present

## 2020-12-30 DIAGNOSIS — M150 Primary generalized (osteo)arthritis: Secondary | ICD-10-CM | POA: Diagnosis not present

## 2021-01-23 DIAGNOSIS — R928 Other abnormal and inconclusive findings on diagnostic imaging of breast: Secondary | ICD-10-CM | POA: Diagnosis not present

## 2021-01-23 DIAGNOSIS — N6320 Unspecified lump in the left breast, unspecified quadrant: Secondary | ICD-10-CM | POA: Diagnosis not present

## 2021-01-25 DIAGNOSIS — M150 Primary generalized (osteo)arthritis: Secondary | ICD-10-CM | POA: Diagnosis not present

## 2021-01-25 DIAGNOSIS — I10 Essential (primary) hypertension: Secondary | ICD-10-CM | POA: Diagnosis not present

## 2021-01-25 DIAGNOSIS — E119 Type 2 diabetes mellitus without complications: Secondary | ICD-10-CM | POA: Diagnosis not present

## 2021-01-25 DIAGNOSIS — E7800 Pure hypercholesterolemia, unspecified: Secondary | ICD-10-CM | POA: Diagnosis not present

## 2021-01-25 DIAGNOSIS — E1165 Type 2 diabetes mellitus with hyperglycemia: Secondary | ICD-10-CM | POA: Diagnosis not present

## 2021-01-25 DIAGNOSIS — N183 Chronic kidney disease, stage 3 unspecified: Secondary | ICD-10-CM | POA: Diagnosis not present

## 2021-01-30 DIAGNOSIS — I10 Essential (primary) hypertension: Secondary | ICD-10-CM | POA: Diagnosis not present

## 2021-01-30 DIAGNOSIS — E7800 Pure hypercholesterolemia, unspecified: Secondary | ICD-10-CM | POA: Diagnosis not present

## 2021-01-30 DIAGNOSIS — G894 Chronic pain syndrome: Secondary | ICD-10-CM | POA: Diagnosis not present

## 2021-01-30 DIAGNOSIS — E119 Type 2 diabetes mellitus without complications: Secondary | ICD-10-CM | POA: Diagnosis not present

## 2021-01-30 DIAGNOSIS — N183 Chronic kidney disease, stage 3 unspecified: Secondary | ICD-10-CM | POA: Diagnosis not present

## 2021-01-30 DIAGNOSIS — Z23 Encounter for immunization: Secondary | ICD-10-CM | POA: Diagnosis not present

## 2021-01-30 DIAGNOSIS — Z0000 Encounter for general adult medical examination without abnormal findings: Secondary | ICD-10-CM | POA: Diagnosis not present

## 2021-01-31 DIAGNOSIS — I10 Essential (primary) hypertension: Secondary | ICD-10-CM | POA: Diagnosis not present

## 2021-02-04 DIAGNOSIS — Z794 Long term (current) use of insulin: Secondary | ICD-10-CM | POA: Diagnosis not present

## 2021-02-04 DIAGNOSIS — I129 Hypertensive chronic kidney disease with stage 1 through stage 4 chronic kidney disease, or unspecified chronic kidney disease: Secondary | ICD-10-CM | POA: Diagnosis not present

## 2021-02-04 DIAGNOSIS — N183 Chronic kidney disease, stage 3 unspecified: Secondary | ICD-10-CM | POA: Diagnosis not present

## 2021-02-04 DIAGNOSIS — M1990 Unspecified osteoarthritis, unspecified site: Secondary | ICD-10-CM | POA: Diagnosis not present

## 2021-02-04 DIAGNOSIS — Z9181 History of falling: Secondary | ICD-10-CM | POA: Diagnosis not present

## 2021-02-04 DIAGNOSIS — M1A0790 Idiopathic chronic gout, unspecified ankle and foot, without tophus (tophi): Secondary | ICD-10-CM | POA: Diagnosis not present

## 2021-02-04 DIAGNOSIS — Z7982 Long term (current) use of aspirin: Secondary | ICD-10-CM | POA: Diagnosis not present

## 2021-02-04 DIAGNOSIS — E7800 Pure hypercholesterolemia, unspecified: Secondary | ICD-10-CM | POA: Diagnosis not present

## 2021-02-04 DIAGNOSIS — R911 Solitary pulmonary nodule: Secondary | ICD-10-CM | POA: Diagnosis not present

## 2021-02-04 DIAGNOSIS — H903 Sensorineural hearing loss, bilateral: Secondary | ICD-10-CM | POA: Diagnosis not present

## 2021-02-04 DIAGNOSIS — E1122 Type 2 diabetes mellitus with diabetic chronic kidney disease: Secondary | ICD-10-CM | POA: Diagnosis not present

## 2021-02-04 DIAGNOSIS — G894 Chronic pain syndrome: Secondary | ICD-10-CM | POA: Diagnosis not present

## 2021-02-04 DIAGNOSIS — E041 Nontoxic single thyroid nodule: Secondary | ICD-10-CM | POA: Diagnosis not present

## 2021-02-04 DIAGNOSIS — Z87891 Personal history of nicotine dependence: Secondary | ICD-10-CM | POA: Diagnosis not present

## 2021-02-07 DIAGNOSIS — E7800 Pure hypercholesterolemia, unspecified: Secondary | ICD-10-CM | POA: Diagnosis not present

## 2021-02-07 DIAGNOSIS — I129 Hypertensive chronic kidney disease with stage 1 through stage 4 chronic kidney disease, or unspecified chronic kidney disease: Secondary | ICD-10-CM | POA: Diagnosis not present

## 2021-02-07 DIAGNOSIS — M1990 Unspecified osteoarthritis, unspecified site: Secondary | ICD-10-CM | POA: Diagnosis not present

## 2021-02-07 DIAGNOSIS — Z9181 History of falling: Secondary | ICD-10-CM | POA: Diagnosis not present

## 2021-02-07 DIAGNOSIS — E041 Nontoxic single thyroid nodule: Secondary | ICD-10-CM | POA: Diagnosis not present

## 2021-02-07 DIAGNOSIS — G894 Chronic pain syndrome: Secondary | ICD-10-CM | POA: Diagnosis not present

## 2021-02-07 DIAGNOSIS — Z794 Long term (current) use of insulin: Secondary | ICD-10-CM | POA: Diagnosis not present

## 2021-02-07 DIAGNOSIS — N183 Chronic kidney disease, stage 3 unspecified: Secondary | ICD-10-CM | POA: Diagnosis not present

## 2021-02-07 DIAGNOSIS — H903 Sensorineural hearing loss, bilateral: Secondary | ICD-10-CM | POA: Diagnosis not present

## 2021-02-07 DIAGNOSIS — M1A0790 Idiopathic chronic gout, unspecified ankle and foot, without tophus (tophi): Secondary | ICD-10-CM | POA: Diagnosis not present

## 2021-02-07 DIAGNOSIS — Z87891 Personal history of nicotine dependence: Secondary | ICD-10-CM | POA: Diagnosis not present

## 2021-02-07 DIAGNOSIS — R911 Solitary pulmonary nodule: Secondary | ICD-10-CM | POA: Diagnosis not present

## 2021-02-07 DIAGNOSIS — Z7982 Long term (current) use of aspirin: Secondary | ICD-10-CM | POA: Diagnosis not present

## 2021-02-07 DIAGNOSIS — E1122 Type 2 diabetes mellitus with diabetic chronic kidney disease: Secondary | ICD-10-CM | POA: Diagnosis not present

## 2021-02-08 DIAGNOSIS — R911 Solitary pulmonary nodule: Secondary | ICD-10-CM | POA: Diagnosis not present

## 2021-02-08 DIAGNOSIS — Z9181 History of falling: Secondary | ICD-10-CM | POA: Diagnosis not present

## 2021-02-08 DIAGNOSIS — N183 Chronic kidney disease, stage 3 unspecified: Secondary | ICD-10-CM | POA: Diagnosis not present

## 2021-02-08 DIAGNOSIS — M1990 Unspecified osteoarthritis, unspecified site: Secondary | ICD-10-CM | POA: Diagnosis not present

## 2021-02-08 DIAGNOSIS — Z87891 Personal history of nicotine dependence: Secondary | ICD-10-CM | POA: Diagnosis not present

## 2021-02-08 DIAGNOSIS — Z7982 Long term (current) use of aspirin: Secondary | ICD-10-CM | POA: Diagnosis not present

## 2021-02-08 DIAGNOSIS — Z794 Long term (current) use of insulin: Secondary | ICD-10-CM | POA: Diagnosis not present

## 2021-02-08 DIAGNOSIS — E041 Nontoxic single thyroid nodule: Secondary | ICD-10-CM | POA: Diagnosis not present

## 2021-02-08 DIAGNOSIS — E7800 Pure hypercholesterolemia, unspecified: Secondary | ICD-10-CM | POA: Diagnosis not present

## 2021-02-08 DIAGNOSIS — I129 Hypertensive chronic kidney disease with stage 1 through stage 4 chronic kidney disease, or unspecified chronic kidney disease: Secondary | ICD-10-CM | POA: Diagnosis not present

## 2021-02-08 DIAGNOSIS — H903 Sensorineural hearing loss, bilateral: Secondary | ICD-10-CM | POA: Diagnosis not present

## 2021-02-08 DIAGNOSIS — E1122 Type 2 diabetes mellitus with diabetic chronic kidney disease: Secondary | ICD-10-CM | POA: Diagnosis not present

## 2021-02-08 DIAGNOSIS — G894 Chronic pain syndrome: Secondary | ICD-10-CM | POA: Diagnosis not present

## 2021-02-08 DIAGNOSIS — M1A0790 Idiopathic chronic gout, unspecified ankle and foot, without tophus (tophi): Secondary | ICD-10-CM | POA: Diagnosis not present

## 2021-02-09 DIAGNOSIS — Z7982 Long term (current) use of aspirin: Secondary | ICD-10-CM | POA: Diagnosis not present

## 2021-02-09 DIAGNOSIS — M1A0790 Idiopathic chronic gout, unspecified ankle and foot, without tophus (tophi): Secondary | ICD-10-CM | POA: Diagnosis not present

## 2021-02-09 DIAGNOSIS — E041 Nontoxic single thyroid nodule: Secondary | ICD-10-CM | POA: Diagnosis not present

## 2021-02-09 DIAGNOSIS — Z9181 History of falling: Secondary | ICD-10-CM | POA: Diagnosis not present

## 2021-02-09 DIAGNOSIS — Z87891 Personal history of nicotine dependence: Secondary | ICD-10-CM | POA: Diagnosis not present

## 2021-02-09 DIAGNOSIS — E1122 Type 2 diabetes mellitus with diabetic chronic kidney disease: Secondary | ICD-10-CM | POA: Diagnosis not present

## 2021-02-09 DIAGNOSIS — H903 Sensorineural hearing loss, bilateral: Secondary | ICD-10-CM | POA: Diagnosis not present

## 2021-02-09 DIAGNOSIS — M1990 Unspecified osteoarthritis, unspecified site: Secondary | ICD-10-CM | POA: Diagnosis not present

## 2021-02-09 DIAGNOSIS — G894 Chronic pain syndrome: Secondary | ICD-10-CM | POA: Diagnosis not present

## 2021-02-09 DIAGNOSIS — N183 Chronic kidney disease, stage 3 unspecified: Secondary | ICD-10-CM | POA: Diagnosis not present

## 2021-02-09 DIAGNOSIS — I129 Hypertensive chronic kidney disease with stage 1 through stage 4 chronic kidney disease, or unspecified chronic kidney disease: Secondary | ICD-10-CM | POA: Diagnosis not present

## 2021-02-09 DIAGNOSIS — Z794 Long term (current) use of insulin: Secondary | ICD-10-CM | POA: Diagnosis not present

## 2021-02-09 DIAGNOSIS — E7800 Pure hypercholesterolemia, unspecified: Secondary | ICD-10-CM | POA: Diagnosis not present

## 2021-02-09 DIAGNOSIS — R911 Solitary pulmonary nodule: Secondary | ICD-10-CM | POA: Diagnosis not present

## 2021-02-10 DIAGNOSIS — Z794 Long term (current) use of insulin: Secondary | ICD-10-CM | POA: Diagnosis not present

## 2021-02-10 DIAGNOSIS — R911 Solitary pulmonary nodule: Secondary | ICD-10-CM | POA: Diagnosis not present

## 2021-02-10 DIAGNOSIS — Z87891 Personal history of nicotine dependence: Secondary | ICD-10-CM | POA: Diagnosis not present

## 2021-02-10 DIAGNOSIS — E1122 Type 2 diabetes mellitus with diabetic chronic kidney disease: Secondary | ICD-10-CM | POA: Diagnosis not present

## 2021-02-10 DIAGNOSIS — E7800 Pure hypercholesterolemia, unspecified: Secondary | ICD-10-CM | POA: Diagnosis not present

## 2021-02-10 DIAGNOSIS — Z7982 Long term (current) use of aspirin: Secondary | ICD-10-CM | POA: Diagnosis not present

## 2021-02-10 DIAGNOSIS — G894 Chronic pain syndrome: Secondary | ICD-10-CM | POA: Diagnosis not present

## 2021-02-10 DIAGNOSIS — Z9181 History of falling: Secondary | ICD-10-CM | POA: Diagnosis not present

## 2021-02-10 DIAGNOSIS — I129 Hypertensive chronic kidney disease with stage 1 through stage 4 chronic kidney disease, or unspecified chronic kidney disease: Secondary | ICD-10-CM | POA: Diagnosis not present

## 2021-02-10 DIAGNOSIS — H903 Sensorineural hearing loss, bilateral: Secondary | ICD-10-CM | POA: Diagnosis not present

## 2021-02-10 DIAGNOSIS — N183 Chronic kidney disease, stage 3 unspecified: Secondary | ICD-10-CM | POA: Diagnosis not present

## 2021-02-10 DIAGNOSIS — M1A0790 Idiopathic chronic gout, unspecified ankle and foot, without tophus (tophi): Secondary | ICD-10-CM | POA: Diagnosis not present

## 2021-02-10 DIAGNOSIS — M1990 Unspecified osteoarthritis, unspecified site: Secondary | ICD-10-CM | POA: Diagnosis not present

## 2021-02-10 DIAGNOSIS — E041 Nontoxic single thyroid nodule: Secondary | ICD-10-CM | POA: Diagnosis not present

## 2021-02-14 DIAGNOSIS — Z794 Long term (current) use of insulin: Secondary | ICD-10-CM | POA: Diagnosis not present

## 2021-02-14 DIAGNOSIS — M1A0790 Idiopathic chronic gout, unspecified ankle and foot, without tophus (tophi): Secondary | ICD-10-CM | POA: Diagnosis not present

## 2021-02-14 DIAGNOSIS — E1122 Type 2 diabetes mellitus with diabetic chronic kidney disease: Secondary | ICD-10-CM | POA: Diagnosis not present

## 2021-02-14 DIAGNOSIS — H903 Sensorineural hearing loss, bilateral: Secondary | ICD-10-CM | POA: Diagnosis not present

## 2021-02-14 DIAGNOSIS — R911 Solitary pulmonary nodule: Secondary | ICD-10-CM | POA: Diagnosis not present

## 2021-02-14 DIAGNOSIS — M1990 Unspecified osteoarthritis, unspecified site: Secondary | ICD-10-CM | POA: Diagnosis not present

## 2021-02-14 DIAGNOSIS — Z9181 History of falling: Secondary | ICD-10-CM | POA: Diagnosis not present

## 2021-02-14 DIAGNOSIS — I129 Hypertensive chronic kidney disease with stage 1 through stage 4 chronic kidney disease, or unspecified chronic kidney disease: Secondary | ICD-10-CM | POA: Diagnosis not present

## 2021-02-14 DIAGNOSIS — Z87891 Personal history of nicotine dependence: Secondary | ICD-10-CM | POA: Diagnosis not present

## 2021-02-14 DIAGNOSIS — E7800 Pure hypercholesterolemia, unspecified: Secondary | ICD-10-CM | POA: Diagnosis not present

## 2021-02-14 DIAGNOSIS — Z7982 Long term (current) use of aspirin: Secondary | ICD-10-CM | POA: Diagnosis not present

## 2021-02-14 DIAGNOSIS — E041 Nontoxic single thyroid nodule: Secondary | ICD-10-CM | POA: Diagnosis not present

## 2021-02-14 DIAGNOSIS — G894 Chronic pain syndrome: Secondary | ICD-10-CM | POA: Diagnosis not present

## 2021-02-14 DIAGNOSIS — N183 Chronic kidney disease, stage 3 unspecified: Secondary | ICD-10-CM | POA: Diagnosis not present

## 2021-02-15 DIAGNOSIS — G894 Chronic pain syndrome: Secondary | ICD-10-CM | POA: Diagnosis not present

## 2021-02-15 DIAGNOSIS — M1990 Unspecified osteoarthritis, unspecified site: Secondary | ICD-10-CM | POA: Diagnosis not present

## 2021-02-15 DIAGNOSIS — Z794 Long term (current) use of insulin: Secondary | ICD-10-CM | POA: Diagnosis not present

## 2021-02-15 DIAGNOSIS — E041 Nontoxic single thyroid nodule: Secondary | ICD-10-CM | POA: Diagnosis not present

## 2021-02-15 DIAGNOSIS — H903 Sensorineural hearing loss, bilateral: Secondary | ICD-10-CM | POA: Diagnosis not present

## 2021-02-15 DIAGNOSIS — N183 Chronic kidney disease, stage 3 unspecified: Secondary | ICD-10-CM | POA: Diagnosis not present

## 2021-02-15 DIAGNOSIS — Z7982 Long term (current) use of aspirin: Secondary | ICD-10-CM | POA: Diagnosis not present

## 2021-02-15 DIAGNOSIS — R911 Solitary pulmonary nodule: Secondary | ICD-10-CM | POA: Diagnosis not present

## 2021-02-15 DIAGNOSIS — Z9181 History of falling: Secondary | ICD-10-CM | POA: Diagnosis not present

## 2021-02-15 DIAGNOSIS — I129 Hypertensive chronic kidney disease with stage 1 through stage 4 chronic kidney disease, or unspecified chronic kidney disease: Secondary | ICD-10-CM | POA: Diagnosis not present

## 2021-02-15 DIAGNOSIS — M1A0790 Idiopathic chronic gout, unspecified ankle and foot, without tophus (tophi): Secondary | ICD-10-CM | POA: Diagnosis not present

## 2021-02-15 DIAGNOSIS — E7800 Pure hypercholesterolemia, unspecified: Secondary | ICD-10-CM | POA: Diagnosis not present

## 2021-02-15 DIAGNOSIS — E1122 Type 2 diabetes mellitus with diabetic chronic kidney disease: Secondary | ICD-10-CM | POA: Diagnosis not present

## 2021-02-15 DIAGNOSIS — Z87891 Personal history of nicotine dependence: Secondary | ICD-10-CM | POA: Diagnosis not present

## 2021-02-16 DIAGNOSIS — Z7982 Long term (current) use of aspirin: Secondary | ICD-10-CM | POA: Diagnosis not present

## 2021-02-16 DIAGNOSIS — Z87891 Personal history of nicotine dependence: Secondary | ICD-10-CM | POA: Diagnosis not present

## 2021-02-16 DIAGNOSIS — G894 Chronic pain syndrome: Secondary | ICD-10-CM | POA: Diagnosis not present

## 2021-02-16 DIAGNOSIS — M1990 Unspecified osteoarthritis, unspecified site: Secondary | ICD-10-CM | POA: Diagnosis not present

## 2021-02-16 DIAGNOSIS — E041 Nontoxic single thyroid nodule: Secondary | ICD-10-CM | POA: Diagnosis not present

## 2021-02-16 DIAGNOSIS — N183 Chronic kidney disease, stage 3 unspecified: Secondary | ICD-10-CM | POA: Diagnosis not present

## 2021-02-16 DIAGNOSIS — Z9181 History of falling: Secondary | ICD-10-CM | POA: Diagnosis not present

## 2021-02-16 DIAGNOSIS — M1A0790 Idiopathic chronic gout, unspecified ankle and foot, without tophus (tophi): Secondary | ICD-10-CM | POA: Diagnosis not present

## 2021-02-16 DIAGNOSIS — H903 Sensorineural hearing loss, bilateral: Secondary | ICD-10-CM | POA: Diagnosis not present

## 2021-02-16 DIAGNOSIS — E1122 Type 2 diabetes mellitus with diabetic chronic kidney disease: Secondary | ICD-10-CM | POA: Diagnosis not present

## 2021-02-16 DIAGNOSIS — Z794 Long term (current) use of insulin: Secondary | ICD-10-CM | POA: Diagnosis not present

## 2021-02-16 DIAGNOSIS — R911 Solitary pulmonary nodule: Secondary | ICD-10-CM | POA: Diagnosis not present

## 2021-02-16 DIAGNOSIS — I129 Hypertensive chronic kidney disease with stage 1 through stage 4 chronic kidney disease, or unspecified chronic kidney disease: Secondary | ICD-10-CM | POA: Diagnosis not present

## 2021-02-16 DIAGNOSIS — E7800 Pure hypercholesterolemia, unspecified: Secondary | ICD-10-CM | POA: Diagnosis not present

## 2021-02-17 DIAGNOSIS — H903 Sensorineural hearing loss, bilateral: Secondary | ICD-10-CM | POA: Diagnosis not present

## 2021-02-17 DIAGNOSIS — Z9181 History of falling: Secondary | ICD-10-CM | POA: Diagnosis not present

## 2021-02-17 DIAGNOSIS — E041 Nontoxic single thyroid nodule: Secondary | ICD-10-CM | POA: Diagnosis not present

## 2021-02-17 DIAGNOSIS — G894 Chronic pain syndrome: Secondary | ICD-10-CM | POA: Diagnosis not present

## 2021-02-17 DIAGNOSIS — Z7982 Long term (current) use of aspirin: Secondary | ICD-10-CM | POA: Diagnosis not present

## 2021-02-17 DIAGNOSIS — N183 Chronic kidney disease, stage 3 unspecified: Secondary | ICD-10-CM | POA: Diagnosis not present

## 2021-02-17 DIAGNOSIS — R911 Solitary pulmonary nodule: Secondary | ICD-10-CM | POA: Diagnosis not present

## 2021-02-17 DIAGNOSIS — I129 Hypertensive chronic kidney disease with stage 1 through stage 4 chronic kidney disease, or unspecified chronic kidney disease: Secondary | ICD-10-CM | POA: Diagnosis not present

## 2021-02-17 DIAGNOSIS — M1A0790 Idiopathic chronic gout, unspecified ankle and foot, without tophus (tophi): Secondary | ICD-10-CM | POA: Diagnosis not present

## 2021-02-17 DIAGNOSIS — Z87891 Personal history of nicotine dependence: Secondary | ICD-10-CM | POA: Diagnosis not present

## 2021-02-17 DIAGNOSIS — Z794 Long term (current) use of insulin: Secondary | ICD-10-CM | POA: Diagnosis not present

## 2021-02-17 DIAGNOSIS — E1122 Type 2 diabetes mellitus with diabetic chronic kidney disease: Secondary | ICD-10-CM | POA: Diagnosis not present

## 2021-02-17 DIAGNOSIS — M1990 Unspecified osteoarthritis, unspecified site: Secondary | ICD-10-CM | POA: Diagnosis not present

## 2021-02-17 DIAGNOSIS — E7800 Pure hypercholesterolemia, unspecified: Secondary | ICD-10-CM | POA: Diagnosis not present

## 2021-02-20 DIAGNOSIS — E1122 Type 2 diabetes mellitus with diabetic chronic kidney disease: Secondary | ICD-10-CM | POA: Diagnosis not present

## 2021-02-20 DIAGNOSIS — E7800 Pure hypercholesterolemia, unspecified: Secondary | ICD-10-CM | POA: Diagnosis not present

## 2021-02-20 DIAGNOSIS — H903 Sensorineural hearing loss, bilateral: Secondary | ICD-10-CM | POA: Diagnosis not present

## 2021-02-20 DIAGNOSIS — Z7982 Long term (current) use of aspirin: Secondary | ICD-10-CM | POA: Diagnosis not present

## 2021-02-20 DIAGNOSIS — Z87891 Personal history of nicotine dependence: Secondary | ICD-10-CM | POA: Diagnosis not present

## 2021-02-20 DIAGNOSIS — Z794 Long term (current) use of insulin: Secondary | ICD-10-CM | POA: Diagnosis not present

## 2021-02-20 DIAGNOSIS — Z9181 History of falling: Secondary | ICD-10-CM | POA: Diagnosis not present

## 2021-02-20 DIAGNOSIS — I129 Hypertensive chronic kidney disease with stage 1 through stage 4 chronic kidney disease, or unspecified chronic kidney disease: Secondary | ICD-10-CM | POA: Diagnosis not present

## 2021-02-20 DIAGNOSIS — M1A0790 Idiopathic chronic gout, unspecified ankle and foot, without tophus (tophi): Secondary | ICD-10-CM | POA: Diagnosis not present

## 2021-02-20 DIAGNOSIS — R911 Solitary pulmonary nodule: Secondary | ICD-10-CM | POA: Diagnosis not present

## 2021-02-20 DIAGNOSIS — G894 Chronic pain syndrome: Secondary | ICD-10-CM | POA: Diagnosis not present

## 2021-02-20 DIAGNOSIS — M1990 Unspecified osteoarthritis, unspecified site: Secondary | ICD-10-CM | POA: Diagnosis not present

## 2021-02-20 DIAGNOSIS — N183 Chronic kidney disease, stage 3 unspecified: Secondary | ICD-10-CM | POA: Diagnosis not present

## 2021-02-20 DIAGNOSIS — E041 Nontoxic single thyroid nodule: Secondary | ICD-10-CM | POA: Diagnosis not present

## 2021-02-23 DIAGNOSIS — Z9181 History of falling: Secondary | ICD-10-CM | POA: Diagnosis not present

## 2021-02-23 DIAGNOSIS — M1990 Unspecified osteoarthritis, unspecified site: Secondary | ICD-10-CM | POA: Diagnosis not present

## 2021-02-23 DIAGNOSIS — E041 Nontoxic single thyroid nodule: Secondary | ICD-10-CM | POA: Diagnosis not present

## 2021-02-23 DIAGNOSIS — R911 Solitary pulmonary nodule: Secondary | ICD-10-CM | POA: Diagnosis not present

## 2021-02-23 DIAGNOSIS — E7800 Pure hypercholesterolemia, unspecified: Secondary | ICD-10-CM | POA: Diagnosis not present

## 2021-02-23 DIAGNOSIS — N183 Chronic kidney disease, stage 3 unspecified: Secondary | ICD-10-CM | POA: Diagnosis not present

## 2021-02-23 DIAGNOSIS — M1A0790 Idiopathic chronic gout, unspecified ankle and foot, without tophus (tophi): Secondary | ICD-10-CM | POA: Diagnosis not present

## 2021-02-23 DIAGNOSIS — H903 Sensorineural hearing loss, bilateral: Secondary | ICD-10-CM | POA: Diagnosis not present

## 2021-02-23 DIAGNOSIS — I129 Hypertensive chronic kidney disease with stage 1 through stage 4 chronic kidney disease, or unspecified chronic kidney disease: Secondary | ICD-10-CM | POA: Diagnosis not present

## 2021-02-23 DIAGNOSIS — G894 Chronic pain syndrome: Secondary | ICD-10-CM | POA: Diagnosis not present

## 2021-02-23 DIAGNOSIS — Z794 Long term (current) use of insulin: Secondary | ICD-10-CM | POA: Diagnosis not present

## 2021-02-23 DIAGNOSIS — Z87891 Personal history of nicotine dependence: Secondary | ICD-10-CM | POA: Diagnosis not present

## 2021-02-23 DIAGNOSIS — E1122 Type 2 diabetes mellitus with diabetic chronic kidney disease: Secondary | ICD-10-CM | POA: Diagnosis not present

## 2021-02-23 DIAGNOSIS — Z7982 Long term (current) use of aspirin: Secondary | ICD-10-CM | POA: Diagnosis not present

## 2021-03-01 DIAGNOSIS — N183 Chronic kidney disease, stage 3 unspecified: Secondary | ICD-10-CM | POA: Diagnosis not present

## 2021-03-01 DIAGNOSIS — R911 Solitary pulmonary nodule: Secondary | ICD-10-CM | POA: Diagnosis not present

## 2021-03-01 DIAGNOSIS — Z7982 Long term (current) use of aspirin: Secondary | ICD-10-CM | POA: Diagnosis not present

## 2021-03-01 DIAGNOSIS — I129 Hypertensive chronic kidney disease with stage 1 through stage 4 chronic kidney disease, or unspecified chronic kidney disease: Secondary | ICD-10-CM | POA: Diagnosis not present

## 2021-03-01 DIAGNOSIS — M1990 Unspecified osteoarthritis, unspecified site: Secondary | ICD-10-CM | POA: Diagnosis not present

## 2021-03-01 DIAGNOSIS — Z9181 History of falling: Secondary | ICD-10-CM | POA: Diagnosis not present

## 2021-03-01 DIAGNOSIS — H903 Sensorineural hearing loss, bilateral: Secondary | ICD-10-CM | POA: Diagnosis not present

## 2021-03-01 DIAGNOSIS — G894 Chronic pain syndrome: Secondary | ICD-10-CM | POA: Diagnosis not present

## 2021-03-01 DIAGNOSIS — E041 Nontoxic single thyroid nodule: Secondary | ICD-10-CM | POA: Diagnosis not present

## 2021-03-01 DIAGNOSIS — M1A0790 Idiopathic chronic gout, unspecified ankle and foot, without tophus (tophi): Secondary | ICD-10-CM | POA: Diagnosis not present

## 2021-03-01 DIAGNOSIS — E7800 Pure hypercholesterolemia, unspecified: Secondary | ICD-10-CM | POA: Diagnosis not present

## 2021-03-01 DIAGNOSIS — E1122 Type 2 diabetes mellitus with diabetic chronic kidney disease: Secondary | ICD-10-CM | POA: Diagnosis not present

## 2021-03-01 DIAGNOSIS — Z794 Long term (current) use of insulin: Secondary | ICD-10-CM | POA: Diagnosis not present

## 2021-03-01 DIAGNOSIS — Z87891 Personal history of nicotine dependence: Secondary | ICD-10-CM | POA: Diagnosis not present

## 2021-03-02 DIAGNOSIS — M1A0790 Idiopathic chronic gout, unspecified ankle and foot, without tophus (tophi): Secondary | ICD-10-CM | POA: Diagnosis not present

## 2021-03-02 DIAGNOSIS — Z87891 Personal history of nicotine dependence: Secondary | ICD-10-CM | POA: Diagnosis not present

## 2021-03-02 DIAGNOSIS — Z794 Long term (current) use of insulin: Secondary | ICD-10-CM | POA: Diagnosis not present

## 2021-03-02 DIAGNOSIS — Z7982 Long term (current) use of aspirin: Secondary | ICD-10-CM | POA: Diagnosis not present

## 2021-03-02 DIAGNOSIS — N183 Chronic kidney disease, stage 3 unspecified: Secondary | ICD-10-CM | POA: Diagnosis not present

## 2021-03-02 DIAGNOSIS — E7800 Pure hypercholesterolemia, unspecified: Secondary | ICD-10-CM | POA: Diagnosis not present

## 2021-03-02 DIAGNOSIS — G894 Chronic pain syndrome: Secondary | ICD-10-CM | POA: Diagnosis not present

## 2021-03-02 DIAGNOSIS — I129 Hypertensive chronic kidney disease with stage 1 through stage 4 chronic kidney disease, or unspecified chronic kidney disease: Secondary | ICD-10-CM | POA: Diagnosis not present

## 2021-03-02 DIAGNOSIS — Z9181 History of falling: Secondary | ICD-10-CM | POA: Diagnosis not present

## 2021-03-02 DIAGNOSIS — M1990 Unspecified osteoarthritis, unspecified site: Secondary | ICD-10-CM | POA: Diagnosis not present

## 2021-03-02 DIAGNOSIS — R911 Solitary pulmonary nodule: Secondary | ICD-10-CM | POA: Diagnosis not present

## 2021-03-02 DIAGNOSIS — E1122 Type 2 diabetes mellitus with diabetic chronic kidney disease: Secondary | ICD-10-CM | POA: Diagnosis not present

## 2021-03-02 DIAGNOSIS — E041 Nontoxic single thyroid nodule: Secondary | ICD-10-CM | POA: Diagnosis not present

## 2021-03-02 DIAGNOSIS — H903 Sensorineural hearing loss, bilateral: Secondary | ICD-10-CM | POA: Diagnosis not present

## 2021-03-03 DIAGNOSIS — I10 Essential (primary) hypertension: Secondary | ICD-10-CM | POA: Diagnosis not present

## 2021-03-03 DIAGNOSIS — N183 Chronic kidney disease, stage 3 unspecified: Secondary | ICD-10-CM | POA: Diagnosis not present

## 2021-03-03 DIAGNOSIS — M150 Primary generalized (osteo)arthritis: Secondary | ICD-10-CM | POA: Diagnosis not present

## 2021-03-03 DIAGNOSIS — E7800 Pure hypercholesterolemia, unspecified: Secondary | ICD-10-CM | POA: Diagnosis not present

## 2021-03-03 DIAGNOSIS — E1165 Type 2 diabetes mellitus with hyperglycemia: Secondary | ICD-10-CM | POA: Diagnosis not present

## 2021-03-03 DIAGNOSIS — E119 Type 2 diabetes mellitus without complications: Secondary | ICD-10-CM | POA: Diagnosis not present

## 2021-03-08 DIAGNOSIS — E7800 Pure hypercholesterolemia, unspecified: Secondary | ICD-10-CM | POA: Diagnosis not present

## 2021-03-08 DIAGNOSIS — H903 Sensorineural hearing loss, bilateral: Secondary | ICD-10-CM | POA: Diagnosis not present

## 2021-03-08 DIAGNOSIS — G894 Chronic pain syndrome: Secondary | ICD-10-CM | POA: Diagnosis not present

## 2021-03-08 DIAGNOSIS — M1990 Unspecified osteoarthritis, unspecified site: Secondary | ICD-10-CM | POA: Diagnosis not present

## 2021-03-08 DIAGNOSIS — I129 Hypertensive chronic kidney disease with stage 1 through stage 4 chronic kidney disease, or unspecified chronic kidney disease: Secondary | ICD-10-CM | POA: Diagnosis not present

## 2021-03-08 DIAGNOSIS — Z794 Long term (current) use of insulin: Secondary | ICD-10-CM | POA: Diagnosis not present

## 2021-03-08 DIAGNOSIS — N183 Chronic kidney disease, stage 3 unspecified: Secondary | ICD-10-CM | POA: Diagnosis not present

## 2021-03-08 DIAGNOSIS — E041 Nontoxic single thyroid nodule: Secondary | ICD-10-CM | POA: Diagnosis not present

## 2021-03-08 DIAGNOSIS — R911 Solitary pulmonary nodule: Secondary | ICD-10-CM | POA: Diagnosis not present

## 2021-03-08 DIAGNOSIS — Z7982 Long term (current) use of aspirin: Secondary | ICD-10-CM | POA: Diagnosis not present

## 2021-03-08 DIAGNOSIS — E1122 Type 2 diabetes mellitus with diabetic chronic kidney disease: Secondary | ICD-10-CM | POA: Diagnosis not present

## 2021-03-08 DIAGNOSIS — Z87891 Personal history of nicotine dependence: Secondary | ICD-10-CM | POA: Diagnosis not present

## 2021-03-08 DIAGNOSIS — M1A0790 Idiopathic chronic gout, unspecified ankle and foot, without tophus (tophi): Secondary | ICD-10-CM | POA: Diagnosis not present

## 2021-03-08 DIAGNOSIS — Z9181 History of falling: Secondary | ICD-10-CM | POA: Diagnosis not present

## 2021-03-13 DIAGNOSIS — H5212 Myopia, left eye: Secondary | ICD-10-CM | POA: Diagnosis not present

## 2021-03-13 DIAGNOSIS — Z961 Presence of intraocular lens: Secondary | ICD-10-CM | POA: Diagnosis not present

## 2021-03-13 DIAGNOSIS — H52202 Unspecified astigmatism, left eye: Secondary | ICD-10-CM | POA: Diagnosis not present

## 2021-03-13 DIAGNOSIS — H524 Presbyopia: Secondary | ICD-10-CM | POA: Diagnosis not present

## 2021-03-13 DIAGNOSIS — H5201 Hypermetropia, right eye: Secondary | ICD-10-CM | POA: Diagnosis not present

## 2021-03-13 DIAGNOSIS — H5231 Anisometropia: Secondary | ICD-10-CM | POA: Diagnosis not present

## 2021-03-13 DIAGNOSIS — H52201 Unspecified astigmatism, right eye: Secondary | ICD-10-CM | POA: Diagnosis not present

## 2021-03-13 DIAGNOSIS — E119 Type 2 diabetes mellitus without complications: Secondary | ICD-10-CM | POA: Diagnosis not present

## 2021-03-17 DIAGNOSIS — E1122 Type 2 diabetes mellitus with diabetic chronic kidney disease: Secondary | ICD-10-CM | POA: Diagnosis not present

## 2021-03-17 DIAGNOSIS — E7800 Pure hypercholesterolemia, unspecified: Secondary | ICD-10-CM | POA: Diagnosis not present

## 2021-03-17 DIAGNOSIS — Z794 Long term (current) use of insulin: Secondary | ICD-10-CM | POA: Diagnosis not present

## 2021-03-17 DIAGNOSIS — M1990 Unspecified osteoarthritis, unspecified site: Secondary | ICD-10-CM | POA: Diagnosis not present

## 2021-03-17 DIAGNOSIS — N183 Chronic kidney disease, stage 3 unspecified: Secondary | ICD-10-CM | POA: Diagnosis not present

## 2021-03-17 DIAGNOSIS — E041 Nontoxic single thyroid nodule: Secondary | ICD-10-CM | POA: Diagnosis not present

## 2021-03-17 DIAGNOSIS — Z7982 Long term (current) use of aspirin: Secondary | ICD-10-CM | POA: Diagnosis not present

## 2021-03-17 DIAGNOSIS — H903 Sensorineural hearing loss, bilateral: Secondary | ICD-10-CM | POA: Diagnosis not present

## 2021-03-17 DIAGNOSIS — M1A0790 Idiopathic chronic gout, unspecified ankle and foot, without tophus (tophi): Secondary | ICD-10-CM | POA: Diagnosis not present

## 2021-03-17 DIAGNOSIS — I129 Hypertensive chronic kidney disease with stage 1 through stage 4 chronic kidney disease, or unspecified chronic kidney disease: Secondary | ICD-10-CM | POA: Diagnosis not present

## 2021-03-17 DIAGNOSIS — R911 Solitary pulmonary nodule: Secondary | ICD-10-CM | POA: Diagnosis not present

## 2021-03-17 DIAGNOSIS — Z87891 Personal history of nicotine dependence: Secondary | ICD-10-CM | POA: Diagnosis not present

## 2021-03-17 DIAGNOSIS — G894 Chronic pain syndrome: Secondary | ICD-10-CM | POA: Diagnosis not present

## 2021-03-17 DIAGNOSIS — Z9181 History of falling: Secondary | ICD-10-CM | POA: Diagnosis not present

## 2021-03-20 DIAGNOSIS — I10 Essential (primary) hypertension: Secondary | ICD-10-CM | POA: Diagnosis not present

## 2021-03-20 DIAGNOSIS — N183 Chronic kidney disease, stage 3 unspecified: Secondary | ICD-10-CM | POA: Diagnosis not present

## 2021-03-20 DIAGNOSIS — M150 Primary generalized (osteo)arthritis: Secondary | ICD-10-CM | POA: Diagnosis not present

## 2021-03-20 DIAGNOSIS — E1165 Type 2 diabetes mellitus with hyperglycemia: Secondary | ICD-10-CM | POA: Diagnosis not present

## 2021-03-20 DIAGNOSIS — E7800 Pure hypercholesterolemia, unspecified: Secondary | ICD-10-CM | POA: Diagnosis not present

## 2021-03-23 DIAGNOSIS — E7800 Pure hypercholesterolemia, unspecified: Secondary | ICD-10-CM | POA: Diagnosis not present

## 2021-03-23 DIAGNOSIS — E1122 Type 2 diabetes mellitus with diabetic chronic kidney disease: Secondary | ICD-10-CM | POA: Diagnosis not present

## 2021-03-23 DIAGNOSIS — H903 Sensorineural hearing loss, bilateral: Secondary | ICD-10-CM | POA: Diagnosis not present

## 2021-03-23 DIAGNOSIS — M1990 Unspecified osteoarthritis, unspecified site: Secondary | ICD-10-CM | POA: Diagnosis not present

## 2021-03-23 DIAGNOSIS — R911 Solitary pulmonary nodule: Secondary | ICD-10-CM | POA: Diagnosis not present

## 2021-03-23 DIAGNOSIS — M1A0790 Idiopathic chronic gout, unspecified ankle and foot, without tophus (tophi): Secondary | ICD-10-CM | POA: Diagnosis not present

## 2021-03-23 DIAGNOSIS — Z7982 Long term (current) use of aspirin: Secondary | ICD-10-CM | POA: Diagnosis not present

## 2021-03-23 DIAGNOSIS — G894 Chronic pain syndrome: Secondary | ICD-10-CM | POA: Diagnosis not present

## 2021-03-23 DIAGNOSIS — E041 Nontoxic single thyroid nodule: Secondary | ICD-10-CM | POA: Diagnosis not present

## 2021-03-23 DIAGNOSIS — I129 Hypertensive chronic kidney disease with stage 1 through stage 4 chronic kidney disease, or unspecified chronic kidney disease: Secondary | ICD-10-CM | POA: Diagnosis not present

## 2021-03-23 DIAGNOSIS — Z794 Long term (current) use of insulin: Secondary | ICD-10-CM | POA: Diagnosis not present

## 2021-03-23 DIAGNOSIS — N183 Chronic kidney disease, stage 3 unspecified: Secondary | ICD-10-CM | POA: Diagnosis not present

## 2021-03-23 DIAGNOSIS — Z87891 Personal history of nicotine dependence: Secondary | ICD-10-CM | POA: Diagnosis not present

## 2021-03-23 DIAGNOSIS — Z9181 History of falling: Secondary | ICD-10-CM | POA: Diagnosis not present

## 2021-03-29 DIAGNOSIS — E041 Nontoxic single thyroid nodule: Secondary | ICD-10-CM | POA: Diagnosis not present

## 2021-03-29 DIAGNOSIS — I129 Hypertensive chronic kidney disease with stage 1 through stage 4 chronic kidney disease, or unspecified chronic kidney disease: Secondary | ICD-10-CM | POA: Diagnosis not present

## 2021-03-29 DIAGNOSIS — M1A0790 Idiopathic chronic gout, unspecified ankle and foot, without tophus (tophi): Secondary | ICD-10-CM | POA: Diagnosis not present

## 2021-03-29 DIAGNOSIS — Z7982 Long term (current) use of aspirin: Secondary | ICD-10-CM | POA: Diagnosis not present

## 2021-03-29 DIAGNOSIS — R911 Solitary pulmonary nodule: Secondary | ICD-10-CM | POA: Diagnosis not present

## 2021-03-29 DIAGNOSIS — E7800 Pure hypercholesterolemia, unspecified: Secondary | ICD-10-CM | POA: Diagnosis not present

## 2021-03-29 DIAGNOSIS — Z794 Long term (current) use of insulin: Secondary | ICD-10-CM | POA: Diagnosis not present

## 2021-03-29 DIAGNOSIS — E1122 Type 2 diabetes mellitus with diabetic chronic kidney disease: Secondary | ICD-10-CM | POA: Diagnosis not present

## 2021-03-29 DIAGNOSIS — M1990 Unspecified osteoarthritis, unspecified site: Secondary | ICD-10-CM | POA: Diagnosis not present

## 2021-03-29 DIAGNOSIS — H903 Sensorineural hearing loss, bilateral: Secondary | ICD-10-CM | POA: Diagnosis not present

## 2021-03-29 DIAGNOSIS — Z87891 Personal history of nicotine dependence: Secondary | ICD-10-CM | POA: Diagnosis not present

## 2021-03-29 DIAGNOSIS — G894 Chronic pain syndrome: Secondary | ICD-10-CM | POA: Diagnosis not present

## 2021-03-29 DIAGNOSIS — Z9181 History of falling: Secondary | ICD-10-CM | POA: Diagnosis not present

## 2021-03-29 DIAGNOSIS — N183 Chronic kidney disease, stage 3 unspecified: Secondary | ICD-10-CM | POA: Diagnosis not present

## 2021-04-21 DIAGNOSIS — M150 Primary generalized (osteo)arthritis: Secondary | ICD-10-CM | POA: Diagnosis not present

## 2021-04-21 DIAGNOSIS — E7800 Pure hypercholesterolemia, unspecified: Secondary | ICD-10-CM | POA: Diagnosis not present

## 2021-04-21 DIAGNOSIS — I10 Essential (primary) hypertension: Secondary | ICD-10-CM | POA: Diagnosis not present

## 2021-04-21 DIAGNOSIS — E1165 Type 2 diabetes mellitus with hyperglycemia: Secondary | ICD-10-CM | POA: Diagnosis not present

## 2021-04-21 DIAGNOSIS — N183 Chronic kidney disease, stage 3 unspecified: Secondary | ICD-10-CM | POA: Diagnosis not present

## 2021-05-19 DIAGNOSIS — E1165 Type 2 diabetes mellitus with hyperglycemia: Secondary | ICD-10-CM | POA: Diagnosis not present

## 2021-05-19 DIAGNOSIS — E7800 Pure hypercholesterolemia, unspecified: Secondary | ICD-10-CM | POA: Diagnosis not present

## 2021-05-19 DIAGNOSIS — M150 Primary generalized (osteo)arthritis: Secondary | ICD-10-CM | POA: Diagnosis not present

## 2021-05-19 DIAGNOSIS — N183 Chronic kidney disease, stage 3 unspecified: Secondary | ICD-10-CM | POA: Diagnosis not present

## 2021-05-19 DIAGNOSIS — I10 Essential (primary) hypertension: Secondary | ICD-10-CM | POA: Diagnosis not present

## 2021-06-28 DIAGNOSIS — E1165 Type 2 diabetes mellitus with hyperglycemia: Secondary | ICD-10-CM | POA: Diagnosis not present

## 2021-06-28 DIAGNOSIS — N183 Chronic kidney disease, stage 3 unspecified: Secondary | ICD-10-CM | POA: Diagnosis not present

## 2021-06-28 DIAGNOSIS — M150 Primary generalized (osteo)arthritis: Secondary | ICD-10-CM | POA: Diagnosis not present

## 2021-06-28 DIAGNOSIS — I10 Essential (primary) hypertension: Secondary | ICD-10-CM | POA: Diagnosis not present

## 2021-06-28 DIAGNOSIS — E7800 Pure hypercholesterolemia, unspecified: Secondary | ICD-10-CM | POA: Diagnosis not present

## 2021-07-04 DIAGNOSIS — I10 Essential (primary) hypertension: Secondary | ICD-10-CM | POA: Diagnosis not present

## 2021-07-19 DIAGNOSIS — E1165 Type 2 diabetes mellitus with hyperglycemia: Secondary | ICD-10-CM | POA: Diagnosis not present

## 2021-07-19 DIAGNOSIS — I10 Essential (primary) hypertension: Secondary | ICD-10-CM | POA: Diagnosis not present

## 2021-07-19 DIAGNOSIS — N183 Chronic kidney disease, stage 3 unspecified: Secondary | ICD-10-CM | POA: Diagnosis not present

## 2021-07-19 DIAGNOSIS — E7800 Pure hypercholesterolemia, unspecified: Secondary | ICD-10-CM | POA: Diagnosis not present

## 2021-07-31 DIAGNOSIS — E1165 Type 2 diabetes mellitus with hyperglycemia: Secondary | ICD-10-CM | POA: Diagnosis not present

## 2021-07-31 DIAGNOSIS — E7800 Pure hypercholesterolemia, unspecified: Secondary | ICD-10-CM | POA: Diagnosis not present

## 2021-07-31 DIAGNOSIS — G894 Chronic pain syndrome: Secondary | ICD-10-CM | POA: Diagnosis not present

## 2021-07-31 DIAGNOSIS — N183 Chronic kidney disease, stage 3 unspecified: Secondary | ICD-10-CM | POA: Diagnosis not present

## 2021-07-31 DIAGNOSIS — I10 Essential (primary) hypertension: Secondary | ICD-10-CM | POA: Diagnosis not present

## 2021-08-01 DIAGNOSIS — E1165 Type 2 diabetes mellitus with hyperglycemia: Secondary | ICD-10-CM | POA: Diagnosis not present

## 2021-08-31 DIAGNOSIS — I10 Essential (primary) hypertension: Secondary | ICD-10-CM | POA: Diagnosis not present

## 2021-10-18 DIAGNOSIS — E1165 Type 2 diabetes mellitus with hyperglycemia: Secondary | ICD-10-CM | POA: Diagnosis not present

## 2021-10-31 DIAGNOSIS — E7800 Pure hypercholesterolemia, unspecified: Secondary | ICD-10-CM | POA: Diagnosis not present

## 2021-10-31 DIAGNOSIS — I10 Essential (primary) hypertension: Secondary | ICD-10-CM | POA: Diagnosis not present

## 2021-10-31 DIAGNOSIS — E1165 Type 2 diabetes mellitus with hyperglycemia: Secondary | ICD-10-CM | POA: Diagnosis not present

## 2022-01-04 DIAGNOSIS — E7800 Pure hypercholesterolemia, unspecified: Secondary | ICD-10-CM | POA: Diagnosis not present

## 2022-01-04 DIAGNOSIS — E1165 Type 2 diabetes mellitus with hyperglycemia: Secondary | ICD-10-CM | POA: Diagnosis not present

## 2022-01-04 DIAGNOSIS — I10 Essential (primary) hypertension: Secondary | ICD-10-CM | POA: Diagnosis not present

## 2022-01-24 DIAGNOSIS — Z1231 Encounter for screening mammogram for malignant neoplasm of breast: Secondary | ICD-10-CM | POA: Diagnosis not present

## 2022-01-29 DIAGNOSIS — H9193 Unspecified hearing loss, bilateral: Secondary | ICD-10-CM | POA: Diagnosis not present

## 2022-01-29 DIAGNOSIS — G894 Chronic pain syndrome: Secondary | ICD-10-CM | POA: Diagnosis not present

## 2022-01-29 DIAGNOSIS — Z0000 Encounter for general adult medical examination without abnormal findings: Secondary | ICD-10-CM | POA: Diagnosis not present

## 2022-01-29 DIAGNOSIS — N183 Chronic kidney disease, stage 3 unspecified: Secondary | ICD-10-CM | POA: Diagnosis not present

## 2022-01-29 DIAGNOSIS — I10 Essential (primary) hypertension: Secondary | ICD-10-CM | POA: Diagnosis not present

## 2022-01-29 DIAGNOSIS — E1165 Type 2 diabetes mellitus with hyperglycemia: Secondary | ICD-10-CM | POA: Diagnosis not present

## 2022-01-29 DIAGNOSIS — E7800 Pure hypercholesterolemia, unspecified: Secondary | ICD-10-CM | POA: Diagnosis not present

## 2022-02-07 DIAGNOSIS — D696 Thrombocytopenia, unspecified: Secondary | ICD-10-CM | POA: Diagnosis not present

## 2022-02-07 DIAGNOSIS — R531 Weakness: Secondary | ICD-10-CM | POA: Diagnosis not present

## 2022-02-07 DIAGNOSIS — Z794 Long term (current) use of insulin: Secondary | ICD-10-CM | POA: Diagnosis not present

## 2022-02-07 DIAGNOSIS — N3001 Acute cystitis with hematuria: Secondary | ICD-10-CM | POA: Diagnosis not present

## 2022-02-07 DIAGNOSIS — R110 Nausea: Secondary | ICD-10-CM | POA: Diagnosis not present

## 2022-02-07 DIAGNOSIS — K828 Other specified diseases of gallbladder: Secondary | ICD-10-CM | POA: Diagnosis not present

## 2022-02-07 DIAGNOSIS — R7881 Bacteremia: Secondary | ICD-10-CM | POA: Diagnosis not present

## 2022-02-07 DIAGNOSIS — R2232 Localized swelling, mass and lump, left upper limb: Secondary | ICD-10-CM | POA: Diagnosis not present

## 2022-02-07 DIAGNOSIS — K769 Liver disease, unspecified: Secondary | ICD-10-CM | POA: Diagnosis not present

## 2022-02-07 DIAGNOSIS — N10 Acute tubulo-interstitial nephritis: Secondary | ICD-10-CM | POA: Diagnosis not present

## 2022-02-07 DIAGNOSIS — D320 Benign neoplasm of cerebral meninges: Secondary | ICD-10-CM | POA: Diagnosis not present

## 2022-02-07 DIAGNOSIS — R918 Other nonspecific abnormal finding of lung field: Secondary | ICD-10-CM | POA: Diagnosis not present

## 2022-02-07 DIAGNOSIS — R1111 Vomiting without nausea: Secondary | ICD-10-CM | POA: Diagnosis not present

## 2022-02-07 DIAGNOSIS — R112 Nausea with vomiting, unspecified: Secondary | ICD-10-CM | POA: Diagnosis not present

## 2022-02-07 DIAGNOSIS — M1990 Unspecified osteoarthritis, unspecified site: Secondary | ICD-10-CM | POA: Diagnosis not present

## 2022-02-07 DIAGNOSIS — E119 Type 2 diabetes mellitus without complications: Secondary | ICD-10-CM | POA: Diagnosis not present

## 2022-02-07 DIAGNOSIS — K449 Diaphragmatic hernia without obstruction or gangrene: Secondary | ICD-10-CM | POA: Diagnosis not present

## 2022-02-07 DIAGNOSIS — E11649 Type 2 diabetes mellitus with hypoglycemia without coma: Secondary | ICD-10-CM | POA: Diagnosis not present

## 2022-02-07 DIAGNOSIS — Z7984 Long term (current) use of oral hypoglycemic drugs: Secondary | ICD-10-CM | POA: Diagnosis not present

## 2022-02-07 DIAGNOSIS — N179 Acute kidney failure, unspecified: Secondary | ICD-10-CM | POA: Diagnosis not present

## 2022-02-07 DIAGNOSIS — M109 Gout, unspecified: Secondary | ICD-10-CM | POA: Diagnosis not present

## 2022-02-07 DIAGNOSIS — I129 Hypertensive chronic kidney disease with stage 1 through stage 4 chronic kidney disease, or unspecified chronic kidney disease: Secondary | ICD-10-CM | POA: Diagnosis not present

## 2022-02-07 DIAGNOSIS — G9389 Other specified disorders of brain: Secondary | ICD-10-CM | POA: Diagnosis not present

## 2022-02-07 DIAGNOSIS — Z20822 Contact with and (suspected) exposure to covid-19: Secondary | ICD-10-CM | POA: Diagnosis not present

## 2022-02-07 DIAGNOSIS — N12 Tubulo-interstitial nephritis, not specified as acute or chronic: Secondary | ICD-10-CM | POA: Diagnosis not present

## 2022-02-07 DIAGNOSIS — R911 Solitary pulmonary nodule: Secondary | ICD-10-CM | POA: Diagnosis not present

## 2022-02-07 DIAGNOSIS — E1122 Type 2 diabetes mellitus with diabetic chronic kidney disease: Secondary | ICD-10-CM | POA: Diagnosis not present

## 2022-02-07 DIAGNOSIS — R509 Fever, unspecified: Secondary | ICD-10-CM | POA: Diagnosis not present

## 2022-02-07 DIAGNOSIS — N281 Cyst of kidney, acquired: Secondary | ICD-10-CM | POA: Diagnosis not present

## 2022-02-07 DIAGNOSIS — I6781 Acute cerebrovascular insufficiency: Secondary | ICD-10-CM | POA: Diagnosis not present

## 2022-02-07 DIAGNOSIS — Z743 Need for continuous supervision: Secondary | ICD-10-CM | POA: Diagnosis not present

## 2022-02-07 DIAGNOSIS — N6332 Unspecified lump in axillary tail of the left breast: Secondary | ICD-10-CM | POA: Diagnosis not present

## 2022-02-07 DIAGNOSIS — E785 Hyperlipidemia, unspecified: Secondary | ICD-10-CM | POA: Diagnosis not present

## 2022-02-07 DIAGNOSIS — N183 Chronic kidney disease, stage 3 unspecified: Secondary | ICD-10-CM | POA: Diagnosis not present

## 2022-02-15 DIAGNOSIS — R262 Difficulty in walking, not elsewhere classified: Secondary | ICD-10-CM | POA: Diagnosis not present

## 2022-02-15 DIAGNOSIS — N3001 Acute cystitis with hematuria: Secondary | ICD-10-CM | POA: Diagnosis not present

## 2022-02-15 DIAGNOSIS — I10 Essential (primary) hypertension: Secondary | ICD-10-CM | POA: Diagnosis not present

## 2022-02-15 DIAGNOSIS — M6281 Muscle weakness (generalized): Secondary | ICD-10-CM | POA: Diagnosis not present

## 2022-02-15 DIAGNOSIS — N6332 Unspecified lump in axillary tail of the left breast: Secondary | ICD-10-CM | POA: Diagnosis not present

## 2022-02-15 DIAGNOSIS — E119 Type 2 diabetes mellitus without complications: Secondary | ICD-10-CM | POA: Diagnosis not present

## 2022-02-15 DIAGNOSIS — N189 Chronic kidney disease, unspecified: Secondary | ICD-10-CM | POA: Diagnosis not present

## 2022-02-15 DIAGNOSIS — R911 Solitary pulmonary nodule: Secondary | ICD-10-CM | POA: Diagnosis not present

## 2022-02-15 DIAGNOSIS — D329 Benign neoplasm of meninges, unspecified: Secondary | ICD-10-CM | POA: Diagnosis not present

## 2022-02-20 DIAGNOSIS — N189 Chronic kidney disease, unspecified: Secondary | ICD-10-CM | POA: Diagnosis not present

## 2022-02-20 DIAGNOSIS — I10 Essential (primary) hypertension: Secondary | ICD-10-CM | POA: Diagnosis not present

## 2022-02-20 DIAGNOSIS — E119 Type 2 diabetes mellitus without complications: Secondary | ICD-10-CM | POA: Diagnosis not present

## 2022-02-20 DIAGNOSIS — R911 Solitary pulmonary nodule: Secondary | ICD-10-CM | POA: Diagnosis not present

## 2022-02-20 DIAGNOSIS — D329 Benign neoplasm of meninges, unspecified: Secondary | ICD-10-CM | POA: Diagnosis not present

## 2022-02-20 DIAGNOSIS — N6332 Unspecified lump in axillary tail of the left breast: Secondary | ICD-10-CM | POA: Diagnosis not present

## 2022-02-20 DIAGNOSIS — N3001 Acute cystitis with hematuria: Secondary | ICD-10-CM | POA: Diagnosis not present

## 2022-02-20 DIAGNOSIS — R262 Difficulty in walking, not elsewhere classified: Secondary | ICD-10-CM | POA: Diagnosis not present

## 2022-02-20 DIAGNOSIS — M6281 Muscle weakness (generalized): Secondary | ICD-10-CM | POA: Diagnosis not present

## 2022-02-23 DIAGNOSIS — N6332 Unspecified lump in axillary tail of the left breast: Secondary | ICD-10-CM | POA: Diagnosis not present

## 2022-02-23 DIAGNOSIS — R911 Solitary pulmonary nodule: Secondary | ICD-10-CM | POA: Diagnosis not present

## 2022-02-23 DIAGNOSIS — N3001 Acute cystitis with hematuria: Secondary | ICD-10-CM | POA: Diagnosis not present

## 2022-02-23 DIAGNOSIS — I10 Essential (primary) hypertension: Secondary | ICD-10-CM | POA: Diagnosis not present

## 2022-02-23 DIAGNOSIS — N189 Chronic kidney disease, unspecified: Secondary | ICD-10-CM | POA: Diagnosis not present

## 2022-02-23 DIAGNOSIS — D329 Benign neoplasm of meninges, unspecified: Secondary | ICD-10-CM | POA: Diagnosis not present

## 2022-02-23 DIAGNOSIS — R262 Difficulty in walking, not elsewhere classified: Secondary | ICD-10-CM | POA: Diagnosis not present

## 2022-02-23 DIAGNOSIS — E119 Type 2 diabetes mellitus without complications: Secondary | ICD-10-CM | POA: Diagnosis not present

## 2022-02-23 DIAGNOSIS — M6281 Muscle weakness (generalized): Secondary | ICD-10-CM | POA: Diagnosis not present

## 2022-02-28 DIAGNOSIS — R918 Other nonspecific abnormal finding of lung field: Secondary | ICD-10-CM | POA: Diagnosis not present

## 2022-02-28 DIAGNOSIS — R2232 Localized swelling, mass and lump, left upper limb: Secondary | ICD-10-CM | POA: Diagnosis not present

## 2022-02-28 DIAGNOSIS — D329 Benign neoplasm of meninges, unspecified: Secondary | ICD-10-CM | POA: Diagnosis not present

## 2022-03-01 DIAGNOSIS — I10 Essential (primary) hypertension: Secondary | ICD-10-CM | POA: Diagnosis not present

## 2022-03-01 DIAGNOSIS — R911 Solitary pulmonary nodule: Secondary | ICD-10-CM | POA: Diagnosis not present

## 2022-03-01 DIAGNOSIS — N3001 Acute cystitis with hematuria: Secondary | ICD-10-CM | POA: Diagnosis not present

## 2022-03-01 DIAGNOSIS — N189 Chronic kidney disease, unspecified: Secondary | ICD-10-CM | POA: Diagnosis not present

## 2022-03-01 DIAGNOSIS — M6281 Muscle weakness (generalized): Secondary | ICD-10-CM | POA: Diagnosis not present

## 2022-03-01 DIAGNOSIS — N6332 Unspecified lump in axillary tail of the left breast: Secondary | ICD-10-CM | POA: Diagnosis not present

## 2022-03-01 DIAGNOSIS — R262 Difficulty in walking, not elsewhere classified: Secondary | ICD-10-CM | POA: Diagnosis not present

## 2022-03-01 DIAGNOSIS — D329 Benign neoplasm of meninges, unspecified: Secondary | ICD-10-CM | POA: Diagnosis not present

## 2022-03-01 DIAGNOSIS — E119 Type 2 diabetes mellitus without complications: Secondary | ICD-10-CM | POA: Diagnosis not present

## 2022-03-02 DIAGNOSIS — R262 Difficulty in walking, not elsewhere classified: Secondary | ICD-10-CM | POA: Diagnosis not present

## 2022-03-02 DIAGNOSIS — M6281 Muscle weakness (generalized): Secondary | ICD-10-CM | POA: Diagnosis not present

## 2022-03-02 DIAGNOSIS — D329 Benign neoplasm of meninges, unspecified: Secondary | ICD-10-CM | POA: Diagnosis not present

## 2022-03-02 DIAGNOSIS — E119 Type 2 diabetes mellitus without complications: Secondary | ICD-10-CM | POA: Diagnosis not present

## 2022-03-02 DIAGNOSIS — N3001 Acute cystitis with hematuria: Secondary | ICD-10-CM | POA: Diagnosis not present

## 2022-03-02 DIAGNOSIS — R911 Solitary pulmonary nodule: Secondary | ICD-10-CM | POA: Diagnosis not present

## 2022-03-02 DIAGNOSIS — N189 Chronic kidney disease, unspecified: Secondary | ICD-10-CM | POA: Diagnosis not present

## 2022-03-02 DIAGNOSIS — I10 Essential (primary) hypertension: Secondary | ICD-10-CM | POA: Diagnosis not present

## 2022-03-02 DIAGNOSIS — N6332 Unspecified lump in axillary tail of the left breast: Secondary | ICD-10-CM | POA: Diagnosis not present

## 2022-03-06 DIAGNOSIS — R262 Difficulty in walking, not elsewhere classified: Secondary | ICD-10-CM | POA: Diagnosis not present

## 2022-03-06 DIAGNOSIS — I10 Essential (primary) hypertension: Secondary | ICD-10-CM | POA: Diagnosis not present

## 2022-03-06 DIAGNOSIS — R911 Solitary pulmonary nodule: Secondary | ICD-10-CM | POA: Diagnosis not present

## 2022-03-06 DIAGNOSIS — N6332 Unspecified lump in axillary tail of the left breast: Secondary | ICD-10-CM | POA: Diagnosis not present

## 2022-03-06 DIAGNOSIS — E119 Type 2 diabetes mellitus without complications: Secondary | ICD-10-CM | POA: Diagnosis not present

## 2022-03-06 DIAGNOSIS — M6281 Muscle weakness (generalized): Secondary | ICD-10-CM | POA: Diagnosis not present

## 2022-03-06 DIAGNOSIS — N3001 Acute cystitis with hematuria: Secondary | ICD-10-CM | POA: Diagnosis not present

## 2022-03-06 DIAGNOSIS — N189 Chronic kidney disease, unspecified: Secondary | ICD-10-CM | POA: Diagnosis not present

## 2022-03-06 DIAGNOSIS — D329 Benign neoplasm of meninges, unspecified: Secondary | ICD-10-CM | POA: Diagnosis not present

## 2022-03-07 DIAGNOSIS — G894 Chronic pain syndrome: Secondary | ICD-10-CM | POA: Diagnosis not present

## 2022-03-07 DIAGNOSIS — Z23 Encounter for immunization: Secondary | ICD-10-CM | POA: Diagnosis not present

## 2022-03-07 DIAGNOSIS — R2232 Localized swelling, mass and lump, left upper limb: Secondary | ICD-10-CM | POA: Diagnosis not present

## 2022-03-07 DIAGNOSIS — R1084 Generalized abdominal pain: Secondary | ICD-10-CM | POA: Diagnosis not present

## 2022-03-07 DIAGNOSIS — E1165 Type 2 diabetes mellitus with hyperglycemia: Secondary | ICD-10-CM | POA: Diagnosis not present

## 2022-03-07 DIAGNOSIS — E7800 Pure hypercholesterolemia, unspecified: Secondary | ICD-10-CM | POA: Diagnosis not present

## 2022-03-07 DIAGNOSIS — I10 Essential (primary) hypertension: Secondary | ICD-10-CM | POA: Diagnosis not present

## 2022-03-07 DIAGNOSIS — N183 Chronic kidney disease, stage 3 unspecified: Secondary | ICD-10-CM | POA: Diagnosis not present

## 2022-03-10 DIAGNOSIS — R7989 Other specified abnormal findings of blood chemistry: Secondary | ICD-10-CM | POA: Diagnosis not present

## 2022-03-10 DIAGNOSIS — E119 Type 2 diabetes mellitus without complications: Secondary | ICD-10-CM | POA: Diagnosis not present

## 2022-03-10 DIAGNOSIS — N189 Chronic kidney disease, unspecified: Secondary | ICD-10-CM | POA: Diagnosis not present

## 2022-03-10 DIAGNOSIS — E785 Hyperlipidemia, unspecified: Secondary | ICD-10-CM | POA: Diagnosis not present

## 2022-03-10 DIAGNOSIS — N3289 Other specified disorders of bladder: Secondary | ICD-10-CM | POA: Diagnosis not present

## 2022-03-10 DIAGNOSIS — R944 Abnormal results of kidney function studies: Secondary | ICD-10-CM | POA: Diagnosis not present

## 2022-03-10 DIAGNOSIS — K449 Diaphragmatic hernia without obstruction or gangrene: Secondary | ICD-10-CM | POA: Diagnosis not present

## 2022-03-10 DIAGNOSIS — M1991 Primary osteoarthritis, unspecified site: Secondary | ICD-10-CM | POA: Diagnosis not present

## 2022-03-10 DIAGNOSIS — E860 Dehydration: Secondary | ICD-10-CM | POA: Diagnosis not present

## 2022-03-10 DIAGNOSIS — N281 Cyst of kidney, acquired: Secondary | ICD-10-CM | POA: Diagnosis not present

## 2022-03-10 DIAGNOSIS — I129 Hypertensive chronic kidney disease with stage 1 through stage 4 chronic kidney disease, or unspecified chronic kidney disease: Secondary | ICD-10-CM | POA: Diagnosis not present

## 2022-03-10 DIAGNOSIS — R531 Weakness: Secondary | ICD-10-CM | POA: Diagnosis not present

## 2022-03-10 DIAGNOSIS — R1032 Left lower quadrant pain: Secondary | ICD-10-CM | POA: Diagnosis not present

## 2022-03-10 DIAGNOSIS — Z794 Long term (current) use of insulin: Secondary | ICD-10-CM | POA: Diagnosis not present

## 2022-03-10 DIAGNOSIS — E278 Other specified disorders of adrenal gland: Secondary | ICD-10-CM | POA: Diagnosis not present

## 2022-03-10 DIAGNOSIS — Z87891 Personal history of nicotine dependence: Secondary | ICD-10-CM | POA: Diagnosis not present

## 2022-03-10 DIAGNOSIS — Z743 Need for continuous supervision: Secondary | ICD-10-CM | POA: Diagnosis not present

## 2022-03-10 DIAGNOSIS — R911 Solitary pulmonary nodule: Secondary | ICD-10-CM | POA: Diagnosis not present

## 2022-03-10 DIAGNOSIS — R109 Unspecified abdominal pain: Secondary | ICD-10-CM | POA: Diagnosis not present

## 2022-03-10 DIAGNOSIS — Z79899 Other long term (current) drug therapy: Secondary | ICD-10-CM | POA: Diagnosis not present

## 2022-03-10 DIAGNOSIS — Z880 Allergy status to penicillin: Secondary | ICD-10-CM | POA: Diagnosis not present

## 2022-03-10 DIAGNOSIS — R404 Transient alteration of awareness: Secondary | ICD-10-CM | POA: Diagnosis not present

## 2022-03-13 DIAGNOSIS — K5790 Diverticulosis of intestine, part unspecified, without perforation or abscess without bleeding: Secondary | ICD-10-CM | POA: Diagnosis not present

## 2022-03-13 DIAGNOSIS — R2232 Localized swelling, mass and lump, left upper limb: Secondary | ICD-10-CM | POA: Diagnosis not present

## 2022-03-13 DIAGNOSIS — R918 Other nonspecific abnormal finding of lung field: Secondary | ICD-10-CM | POA: Diagnosis not present

## 2022-03-13 DIAGNOSIS — R590 Localized enlarged lymph nodes: Secondary | ICD-10-CM | POA: Diagnosis not present

## 2022-03-13 DIAGNOSIS — E278 Other specified disorders of adrenal gland: Secondary | ICD-10-CM | POA: Diagnosis not present

## 2022-03-14 DIAGNOSIS — Z961 Presence of intraocular lens: Secondary | ICD-10-CM | POA: Diagnosis not present

## 2022-03-14 DIAGNOSIS — H5212 Myopia, left eye: Secondary | ICD-10-CM | POA: Diagnosis not present

## 2022-03-14 DIAGNOSIS — E119 Type 2 diabetes mellitus without complications: Secondary | ICD-10-CM | POA: Diagnosis not present

## 2022-03-14 DIAGNOSIS — H52202 Unspecified astigmatism, left eye: Secondary | ICD-10-CM | POA: Diagnosis not present

## 2022-03-14 DIAGNOSIS — H524 Presbyopia: Secondary | ICD-10-CM | POA: Diagnosis not present

## 2022-03-14 DIAGNOSIS — H52201 Unspecified astigmatism, right eye: Secondary | ICD-10-CM | POA: Diagnosis not present

## 2022-03-14 DIAGNOSIS — H5231 Anisometropia: Secondary | ICD-10-CM | POA: Diagnosis not present

## 2022-03-14 DIAGNOSIS — H5201 Hypermetropia, right eye: Secondary | ICD-10-CM | POA: Diagnosis not present

## 2022-03-21 DIAGNOSIS — M6281 Muscle weakness (generalized): Secondary | ICD-10-CM | POA: Diagnosis not present

## 2022-03-21 DIAGNOSIS — I10 Essential (primary) hypertension: Secondary | ICD-10-CM | POA: Diagnosis not present

## 2022-03-21 DIAGNOSIS — E119 Type 2 diabetes mellitus without complications: Secondary | ICD-10-CM | POA: Diagnosis not present

## 2022-03-21 DIAGNOSIS — N189 Chronic kidney disease, unspecified: Secondary | ICD-10-CM | POA: Diagnosis not present

## 2022-03-21 DIAGNOSIS — N3001 Acute cystitis with hematuria: Secondary | ICD-10-CM | POA: Diagnosis not present

## 2022-03-21 DIAGNOSIS — R911 Solitary pulmonary nodule: Secondary | ICD-10-CM | POA: Diagnosis not present

## 2022-03-21 DIAGNOSIS — N6332 Unspecified lump in axillary tail of the left breast: Secondary | ICD-10-CM | POA: Diagnosis not present

## 2022-03-21 DIAGNOSIS — H5213 Myopia, bilateral: Secondary | ICD-10-CM | POA: Diagnosis not present

## 2022-03-21 DIAGNOSIS — R262 Difficulty in walking, not elsewhere classified: Secondary | ICD-10-CM | POA: Diagnosis not present

## 2022-03-21 DIAGNOSIS — D329 Benign neoplasm of meninges, unspecified: Secondary | ICD-10-CM | POA: Diagnosis not present

## 2022-03-21 DIAGNOSIS — H52223 Regular astigmatism, bilateral: Secondary | ICD-10-CM | POA: Diagnosis not present

## 2022-03-22 DIAGNOSIS — R911 Solitary pulmonary nodule: Secondary | ICD-10-CM | POA: Diagnosis not present

## 2022-03-22 DIAGNOSIS — R2232 Localized swelling, mass and lump, left upper limb: Secondary | ICD-10-CM | POA: Diagnosis not present

## 2022-03-22 DIAGNOSIS — D329 Benign neoplasm of meninges, unspecified: Secondary | ICD-10-CM | POA: Diagnosis not present

## 2022-03-23 DIAGNOSIS — R262 Difficulty in walking, not elsewhere classified: Secondary | ICD-10-CM | POA: Diagnosis not present

## 2022-03-23 DIAGNOSIS — D329 Benign neoplasm of meninges, unspecified: Secondary | ICD-10-CM | POA: Diagnosis not present

## 2022-03-23 DIAGNOSIS — N189 Chronic kidney disease, unspecified: Secondary | ICD-10-CM | POA: Diagnosis not present

## 2022-03-23 DIAGNOSIS — N3001 Acute cystitis with hematuria: Secondary | ICD-10-CM | POA: Diagnosis not present

## 2022-03-23 DIAGNOSIS — I10 Essential (primary) hypertension: Secondary | ICD-10-CM | POA: Diagnosis not present

## 2022-03-23 DIAGNOSIS — E119 Type 2 diabetes mellitus without complications: Secondary | ICD-10-CM | POA: Diagnosis not present

## 2022-03-23 DIAGNOSIS — R911 Solitary pulmonary nodule: Secondary | ICD-10-CM | POA: Diagnosis not present

## 2022-03-23 DIAGNOSIS — M6281 Muscle weakness (generalized): Secondary | ICD-10-CM | POA: Diagnosis not present

## 2022-03-23 DIAGNOSIS — N6332 Unspecified lump in axillary tail of the left breast: Secondary | ICD-10-CM | POA: Diagnosis not present

## 2022-03-27 DIAGNOSIS — E119 Type 2 diabetes mellitus without complications: Secondary | ICD-10-CM | POA: Diagnosis not present

## 2022-03-27 DIAGNOSIS — I10 Essential (primary) hypertension: Secondary | ICD-10-CM | POA: Diagnosis not present

## 2022-03-27 DIAGNOSIS — D329 Benign neoplasm of meninges, unspecified: Secondary | ICD-10-CM | POA: Diagnosis not present

## 2022-03-27 DIAGNOSIS — N6332 Unspecified lump in axillary tail of the left breast: Secondary | ICD-10-CM | POA: Diagnosis not present

## 2022-03-27 DIAGNOSIS — M6281 Muscle weakness (generalized): Secondary | ICD-10-CM | POA: Diagnosis not present

## 2022-03-27 DIAGNOSIS — R911 Solitary pulmonary nodule: Secondary | ICD-10-CM | POA: Diagnosis not present

## 2022-03-27 DIAGNOSIS — N189 Chronic kidney disease, unspecified: Secondary | ICD-10-CM | POA: Diagnosis not present

## 2022-03-27 DIAGNOSIS — N3001 Acute cystitis with hematuria: Secondary | ICD-10-CM | POA: Diagnosis not present

## 2022-03-27 DIAGNOSIS — R262 Difficulty in walking, not elsewhere classified: Secondary | ICD-10-CM | POA: Diagnosis not present

## 2022-04-03 DIAGNOSIS — E119 Type 2 diabetes mellitus without complications: Secondary | ICD-10-CM | POA: Diagnosis not present

## 2022-04-03 DIAGNOSIS — R262 Difficulty in walking, not elsewhere classified: Secondary | ICD-10-CM | POA: Diagnosis not present

## 2022-04-03 DIAGNOSIS — E1165 Type 2 diabetes mellitus with hyperglycemia: Secondary | ICD-10-CM | POA: Diagnosis not present

## 2022-04-03 DIAGNOSIS — N6332 Unspecified lump in axillary tail of the left breast: Secondary | ICD-10-CM | POA: Diagnosis not present

## 2022-04-03 DIAGNOSIS — I10 Essential (primary) hypertension: Secondary | ICD-10-CM | POA: Diagnosis not present

## 2022-04-03 DIAGNOSIS — N189 Chronic kidney disease, unspecified: Secondary | ICD-10-CM | POA: Diagnosis not present

## 2022-04-03 DIAGNOSIS — N3001 Acute cystitis with hematuria: Secondary | ICD-10-CM | POA: Diagnosis not present

## 2022-04-03 DIAGNOSIS — D329 Benign neoplasm of meninges, unspecified: Secondary | ICD-10-CM | POA: Diagnosis not present

## 2022-04-03 DIAGNOSIS — M6281 Muscle weakness (generalized): Secondary | ICD-10-CM | POA: Diagnosis not present

## 2022-04-03 DIAGNOSIS — E7800 Pure hypercholesterolemia, unspecified: Secondary | ICD-10-CM | POA: Diagnosis not present

## 2022-04-03 DIAGNOSIS — R911 Solitary pulmonary nodule: Secondary | ICD-10-CM | POA: Diagnosis not present

## 2022-04-05 DIAGNOSIS — R911 Solitary pulmonary nodule: Secondary | ICD-10-CM | POA: Diagnosis not present

## 2022-04-05 DIAGNOSIS — E7800 Pure hypercholesterolemia, unspecified: Secondary | ICD-10-CM | POA: Diagnosis not present

## 2022-04-05 DIAGNOSIS — N3001 Acute cystitis with hematuria: Secondary | ICD-10-CM | POA: Diagnosis not present

## 2022-04-05 DIAGNOSIS — M6281 Muscle weakness (generalized): Secondary | ICD-10-CM | POA: Diagnosis not present

## 2022-04-05 DIAGNOSIS — R262 Difficulty in walking, not elsewhere classified: Secondary | ICD-10-CM | POA: Diagnosis not present

## 2022-04-05 DIAGNOSIS — E1165 Type 2 diabetes mellitus with hyperglycemia: Secondary | ICD-10-CM | POA: Diagnosis not present

## 2022-04-05 DIAGNOSIS — I10 Essential (primary) hypertension: Secondary | ICD-10-CM | POA: Diagnosis not present

## 2022-04-05 DIAGNOSIS — E119 Type 2 diabetes mellitus without complications: Secondary | ICD-10-CM | POA: Diagnosis not present

## 2022-04-05 DIAGNOSIS — D329 Benign neoplasm of meninges, unspecified: Secondary | ICD-10-CM | POA: Diagnosis not present

## 2022-04-05 DIAGNOSIS — N6332 Unspecified lump in axillary tail of the left breast: Secondary | ICD-10-CM | POA: Diagnosis not present

## 2022-04-05 DIAGNOSIS — N189 Chronic kidney disease, unspecified: Secondary | ICD-10-CM | POA: Diagnosis not present

## 2022-04-11 DIAGNOSIS — R262 Difficulty in walking, not elsewhere classified: Secondary | ICD-10-CM | POA: Diagnosis not present

## 2022-04-11 DIAGNOSIS — I10 Essential (primary) hypertension: Secondary | ICD-10-CM | POA: Diagnosis not present

## 2022-04-11 DIAGNOSIS — N6332 Unspecified lump in axillary tail of the left breast: Secondary | ICD-10-CM | POA: Diagnosis not present

## 2022-04-11 DIAGNOSIS — D329 Benign neoplasm of meninges, unspecified: Secondary | ICD-10-CM | POA: Diagnosis not present

## 2022-04-11 DIAGNOSIS — E119 Type 2 diabetes mellitus without complications: Secondary | ICD-10-CM | POA: Diagnosis not present

## 2022-04-11 DIAGNOSIS — M6281 Muscle weakness (generalized): Secondary | ICD-10-CM | POA: Diagnosis not present

## 2022-04-11 DIAGNOSIS — N3001 Acute cystitis with hematuria: Secondary | ICD-10-CM | POA: Diagnosis not present

## 2022-04-11 DIAGNOSIS — R911 Solitary pulmonary nodule: Secondary | ICD-10-CM | POA: Diagnosis not present

## 2022-04-11 DIAGNOSIS — N189 Chronic kidney disease, unspecified: Secondary | ICD-10-CM | POA: Diagnosis not present

## 2022-04-13 DIAGNOSIS — E119 Type 2 diabetes mellitus without complications: Secondary | ICD-10-CM | POA: Diagnosis not present

## 2022-04-13 DIAGNOSIS — M6281 Muscle weakness (generalized): Secondary | ICD-10-CM | POA: Diagnosis not present

## 2022-04-13 DIAGNOSIS — N189 Chronic kidney disease, unspecified: Secondary | ICD-10-CM | POA: Diagnosis not present

## 2022-04-13 DIAGNOSIS — R262 Difficulty in walking, not elsewhere classified: Secondary | ICD-10-CM | POA: Diagnosis not present

## 2022-04-13 DIAGNOSIS — D329 Benign neoplasm of meninges, unspecified: Secondary | ICD-10-CM | POA: Diagnosis not present

## 2022-04-13 DIAGNOSIS — R911 Solitary pulmonary nodule: Secondary | ICD-10-CM | POA: Diagnosis not present

## 2022-04-13 DIAGNOSIS — N6332 Unspecified lump in axillary tail of the left breast: Secondary | ICD-10-CM | POA: Diagnosis not present

## 2022-04-13 DIAGNOSIS — N3001 Acute cystitis with hematuria: Secondary | ICD-10-CM | POA: Diagnosis not present

## 2022-04-13 DIAGNOSIS — I10 Essential (primary) hypertension: Secondary | ICD-10-CM | POA: Diagnosis not present

## 2022-06-20 DIAGNOSIS — R42 Dizziness and giddiness: Secondary | ICD-10-CM | POA: Diagnosis not present

## 2022-06-20 DIAGNOSIS — Z743 Need for continuous supervision: Secondary | ICD-10-CM | POA: Diagnosis not present

## 2022-06-20 DIAGNOSIS — H81399 Other peripheral vertigo, unspecified ear: Secondary | ICD-10-CM | POA: Diagnosis not present

## 2022-06-20 DIAGNOSIS — Z79899 Other long term (current) drug therapy: Secondary | ICD-10-CM | POA: Diagnosis not present

## 2022-06-20 DIAGNOSIS — R1111 Vomiting without nausea: Secondary | ICD-10-CM | POA: Diagnosis not present

## 2022-06-20 DIAGNOSIS — E119 Type 2 diabetes mellitus without complications: Secondary | ICD-10-CM | POA: Diagnosis not present

## 2022-06-20 DIAGNOSIS — R404 Transient alteration of awareness: Secondary | ICD-10-CM | POA: Diagnosis not present

## 2022-06-20 DIAGNOSIS — Z20822 Contact with and (suspected) exposure to covid-19: Secondary | ICD-10-CM | POA: Diagnosis not present

## 2022-06-20 DIAGNOSIS — R110 Nausea: Secondary | ICD-10-CM | POA: Diagnosis not present

## 2022-06-20 DIAGNOSIS — G4489 Other headache syndrome: Secondary | ICD-10-CM | POA: Diagnosis not present

## 2022-06-21 DIAGNOSIS — Z20822 Contact with and (suspected) exposure to covid-19: Secondary | ICD-10-CM | POA: Diagnosis not present

## 2022-06-21 DIAGNOSIS — G9389 Other specified disorders of brain: Secondary | ICD-10-CM | POA: Diagnosis not present

## 2022-06-21 DIAGNOSIS — R42 Dizziness and giddiness: Secondary | ICD-10-CM | POA: Diagnosis not present

## 2022-06-27 DIAGNOSIS — E7800 Pure hypercholesterolemia, unspecified: Secondary | ICD-10-CM | POA: Diagnosis not present

## 2022-06-27 DIAGNOSIS — N183 Chronic kidney disease, stage 3 unspecified: Secondary | ICD-10-CM | POA: Diagnosis not present

## 2022-06-27 DIAGNOSIS — E1165 Type 2 diabetes mellitus with hyperglycemia: Secondary | ICD-10-CM | POA: Diagnosis not present

## 2022-06-27 DIAGNOSIS — R42 Dizziness and giddiness: Secondary | ICD-10-CM | POA: Diagnosis not present

## 2022-06-27 DIAGNOSIS — G894 Chronic pain syndrome: Secondary | ICD-10-CM | POA: Diagnosis not present

## 2022-06-27 DIAGNOSIS — I10 Essential (primary) hypertension: Secondary | ICD-10-CM | POA: Diagnosis not present

## 2022-09-26 ENCOUNTER — Other Ambulatory Visit: Payer: Self-pay | Admitting: Family Medicine

## 2022-09-26 DIAGNOSIS — Z1231 Encounter for screening mammogram for malignant neoplasm of breast: Secondary | ICD-10-CM

## 2022-11-06 ENCOUNTER — Emergency Department (HOSPITAL_COMMUNITY): Payer: 59

## 2022-11-06 ENCOUNTER — Emergency Department (HOSPITAL_COMMUNITY)
Admission: EM | Admit: 2022-11-06 | Discharge: 2022-11-06 | Disposition: A | Discharge status: Home / Self Care | Payer: 59 | Attending: Emergency Medicine | Admitting: Emergency Medicine

## 2022-11-06 ENCOUNTER — Other Ambulatory Visit: Payer: Self-pay

## 2022-11-06 DIAGNOSIS — I10 Essential (primary) hypertension: Secondary | ICD-10-CM | POA: Diagnosis not present

## 2022-11-06 DIAGNOSIS — Z79899 Other long term (current) drug therapy: Secondary | ICD-10-CM | POA: Diagnosis not present

## 2022-11-06 DIAGNOSIS — G4489 Other headache syndrome: Secondary | ICD-10-CM | POA: Diagnosis not present

## 2022-11-06 DIAGNOSIS — E119 Type 2 diabetes mellitus without complications: Secondary | ICD-10-CM | POA: Diagnosis not present

## 2022-11-06 DIAGNOSIS — Z743 Need for continuous supervision: Secondary | ICD-10-CM | POA: Diagnosis not present

## 2022-11-06 DIAGNOSIS — R519 Headache, unspecified: Secondary | ICD-10-CM | POA: Insufficient documentation

## 2022-11-06 DIAGNOSIS — R197 Diarrhea, unspecified: Secondary | ICD-10-CM | POA: Diagnosis not present

## 2022-11-06 DIAGNOSIS — R531 Weakness: Secondary | ICD-10-CM | POA: Diagnosis not present

## 2022-11-06 DIAGNOSIS — Z7984 Long term (current) use of oral hypoglycemic drugs: Secondary | ICD-10-CM | POA: Insufficient documentation

## 2022-11-06 DIAGNOSIS — R6889 Other general symptoms and signs: Secondary | ICD-10-CM | POA: Diagnosis not present

## 2022-11-06 DIAGNOSIS — Z794 Long term (current) use of insulin: Secondary | ICD-10-CM | POA: Insufficient documentation

## 2022-11-06 DIAGNOSIS — Z87891 Personal history of nicotine dependence: Secondary | ICD-10-CM | POA: Diagnosis not present

## 2022-11-06 DIAGNOSIS — Z7982 Long term (current) use of aspirin: Secondary | ICD-10-CM | POA: Diagnosis not present

## 2022-11-06 LAB — CBC WITH DIFFERENTIAL/PLATELET
Abs Immature Granulocytes: 0.04 10*3/uL (ref 0.00–0.07)
Basophils Absolute: 0 10*3/uL (ref 0.0–0.1)
Basophils Relative: 0 %
Eosinophils Absolute: 0.1 10*3/uL (ref 0.0–0.5)
Eosinophils Relative: 1 %
HCT: 40.1 % (ref 36.0–46.0)
Hemoglobin: 12.9 g/dL (ref 12.0–15.0)
Immature Granulocytes: 1 %
Lymphocytes Relative: 29 %
Lymphs Abs: 2.4 10*3/uL (ref 0.7–4.0)
MCH: 29 pg (ref 26.0–34.0)
MCHC: 32.2 g/dL (ref 30.0–36.0)
MCV: 90.1 fL (ref 80.0–100.0)
Monocytes Absolute: 0.7 10*3/uL (ref 0.1–1.0)
Monocytes Relative: 8 %
Neutro Abs: 5.2 10*3/uL (ref 1.7–7.7)
Neutrophils Relative %: 61 %
Platelets: 169 10*3/uL (ref 150–400)
RBC: 4.45 MIL/uL (ref 3.87–5.11)
RDW: 12.9 % (ref 11.5–15.5)
WBC: 8.4 10*3/uL (ref 4.0–10.5)
nRBC: 0 % (ref 0.0–0.2)

## 2022-11-06 LAB — URINALYSIS, W/ REFLEX TO CULTURE (INFECTION SUSPECTED)
Bacteria, UA: NONE SEEN
Bilirubin Urine: NEGATIVE
Glucose, UA: NEGATIVE mg/dL
Ketones, ur: NEGATIVE mg/dL
Leukocytes,Ua: NEGATIVE
Nitrite: NEGATIVE
Protein, ur: 30 mg/dL — AB
Specific Gravity, Urine: 1.01 (ref 1.005–1.030)
pH: 5 (ref 5.0–8.0)

## 2022-11-06 LAB — COMPREHENSIVE METABOLIC PANEL
ALT: 13 U/L (ref 0–44)
AST: 17 U/L (ref 15–41)
Albumin: 3.4 g/dL — ABNORMAL LOW (ref 3.5–5.0)
Alkaline Phosphatase: 41 U/L (ref 38–126)
Anion gap: 8 (ref 5–15)
BUN: 21 mg/dL (ref 8–23)
CO2: 24 mmol/L (ref 22–32)
Calcium: 8.9 mg/dL (ref 8.9–10.3)
Chloride: 104 mmol/L (ref 98–111)
Creatinine, Ser: 1.1 mg/dL — ABNORMAL HIGH (ref 0.44–1.00)
GFR, Estimated: 48 mL/min — ABNORMAL LOW (ref >60)
Glucose, Bld: 118 mg/dL — ABNORMAL HIGH (ref 70–99)
Potassium: 4.1 mmol/L (ref 3.5–5.1)
Sodium: 136 mmol/L (ref 135–145)
Total Bilirubin: 0.6 mg/dL (ref 0.3–1.2)
Total Protein: 6.8 g/dL (ref 6.5–8.1)

## 2022-11-06 MED ORDER — SODIUM CHLORIDE 0.9 % IV BOLUS
1000.0000 mL | Freq: Once | INTRAVENOUS | Status: AC
  Administered 2022-11-06: 1000 mL via INTRAVENOUS

## 2022-11-06 NOTE — Discharge Instructions (Addendum)
We evaluated you for your diarrhea and weakness.  Your lab test showed very mild dehydration and we gave you IV fluids.  Your symptoms improved in the emergency department.  Please follow-up with your primary doctor for recheck of your lab tests.  If you have recurrent diarrhea, you can take Imodium, 2 mg every 6 hours as needed.  You can buy this medicine over-the-counter.  Please return to the emergency department if you develop any new symptoms such as worsening diarrhea, bloody stools, black stool, abdominal pain, vomiting, fevers or chills, or any other new symptoms.

## 2022-11-06 NOTE — ED Triage Notes (Signed)
Arrived via ems from Home, for hypertension, woke up this morning had diarrhea and headache, took bp it was in 170s this afternoon. EMS got 194/70. Pulse at 60, 96% 151cbg.

## 2022-11-06 NOTE — ED Provider Notes (Signed)
[place] EMERGENCY DEPARTMENT AT [facility] Provider Note  CSN: [id number] Arrival date & time: 11/06/22 1514  Chief Complaint(s) Hypertension  HPI Rhonda Conrad is a 89 y.o. female with history of diabetes, hyperlipidemia, hypertension presenting to the emergency department with diarrhea.  Patient reports that she had diarrhea starting last night into this morning.  No blood in her stool or black stool.  No nausea or vomiting.  No abdominal pain.  She reports that she ate some food that was left out and is concerned this could be a cause of it.  No recent antibiotics or travel.  No fevers or chills.  Reports mild generalized weakness as well.  Her caregiver came this morning and found her, checked her blood pressure and it was elevated so they called the paramedics.  Paramedics reported blood pressure was 194/70.  Patient reports earlier she had a mild headache without nausea or vomiting, vision changes, numbness or tingling, weakness, history of head trauma or other new symptoms.    Past Medical History Past Medical History:  Diagnosis Date   Anxiety    Arthritis    Diabetes mellitus without complication (HCC)    Headache    High cholesterol    Hypertension    Patient Active Problem List   Diagnosis Date Noted   Anxiety state, unspecified 06/14/2013   Chest pain 06/13/2013   DM (diabetes mellitus) (HCC) 06/13/2013   Hypertension 06/13/2013   Hyperlipidemia 06/13/2013   Home Medication(s) Prior to Admission medications   Medication Sig Start Date End Date Taking? Authorizing Provider  acetaminophen (TYLENOL) 500 MG tablet Take 500 mg by mouth every 8 (eight) hours as needed for mild pain or headache.     [provider]  ALPRAZolam Prudy Feeler) 0.5 MG tablet Take 0.5 mg by mouth 3 (three) times daily as needed for anxiety.  02/12/14   [provider]  amLODipine (NORVASC) 10 MG tablet Take 10 mg by mouth daily.    [provider]  aspirin  EC 81 MG EC tablet Take 1 tablet (81 mg total) by mouth daily. 06/15/13   Christiane Ha, MD  benzonatate (TESSALON) 100 MG capsule Take 100 mg by mouth 3 (three) times daily as needed for cough.    [provider]  busPIRone (BUSPAR) 7.5 MG tablet Take 7.5 mg by mouth 2 (two) times daily.    [provider]  colchicine (COLCRYS) 0.6 MG tablet Take 0.6 mg by mouth 2 (two) times daily as needed (gout).     [provider]  diclofenac sodium (VOLTAREN) 1 % GEL Apply 2 g topically 2 (two) times daily as needed (arthritic pain).    [provider]  HYDROcodone-acetaminophen (NORCO/VICODIN) 5-325 MG tablet Take 1-2 tablets by mouth every 4 (four) hours as needed for moderate pain or severe pain. 01/28/17   Chevis Pretty III, MD  insulin glargine (LANTUS) 100 UNIT/ML injection Inject 34 Units into the skin 2 (two) times daily.     [provider]  losartan (COZAAR) 100 MG tablet Take 100 mg by mouth daily 02/07/16   [provider]  ondansetron (ZOFRAN) 8 MG tablet Take 8 mg by mouth twice daily as needed for nausea 11/19/16   [provider]  oxyCODONE-acetaminophen (PERCOCET) 7.5-325 MG per tablet Take 0.5-1 tablets by mouth 2 (two) times daily as needed for severe pain.     [provider]  pantoprazole (PROTONIX) 40 MG tablet Take 40 mg by mouth 2 (two) times  daily as needed (heartburn).     [provider]  simvastatin (ZOCOR) 20 MG tablet Take 20 mg by mouth every evening.    [provider]  sitaGLIPtin-metformin (JANUMET) 50-500 MG per tablet Take 1 tablet by mouth daily.    [provider]  tiZANidine (ZANAFLEX) 2 MG tablet Take 2 mg by mouth at bedtime as needed for muscle pain 11/05/16   [provider]  triamterene-hydrochlorothiazide (MAXZIDE-25) 37.5-25 MG tablet Take 1 tablet by mouth in the morning 11/05/16   [provider]                                                                                                                                     Past Surgical History Past Surgical History:  Procedure Laterality Date   AXILLARY LYMPH NODE BIOPSY Left 01/28/2017   Procedure: LEFT AXILLARY LYMPH NODE BIOPSY;  Surgeon: Griselda Miner, MD;  Location: [facility] OR;  Service: General;  Laterality: Left;  RNFA   CARDIAC CATHETERIZATION     LEFT HEART CATHETERIZATION WITH CORONARY ANGIOGRAM N/A 06/15/2013   Procedure: LEFT HEART CATHETERIZATION WITH CORONARY ANGIOGRAM;  Surgeon: Robynn Pane, MD;  Location: [facility] CATH LAB;  Service: Cardiovascular;  Laterality: N/A;   vaginal polyp removal     Family History No family history on file.  Social History Social History   Tobacco Use   Smoking status: Former   Smokeless tobacco: Never  Substance Use Topics   Alcohol use: No   Drug use: No   Allergies Penicillins  Review of Systems Review of Systems  All other systems reviewed and are negative.   Physical Exam Vital Signs  I have reviewed the triage vital signs BP (!) 144/50   Pulse 66   Temp 98.2 F (36.8 C) (Oral)   Resp 16   Ht 5\' 2"  (1.575 m)   Wt 89.8 kg   SpO2 97%   BMI 36.21 kg/m  Physical Exam Vitals and nursing note reviewed.  Constitutional:      General: She is not in acute distress.    Appearance: She is well-developed.  HENT:     Head: Normocephalic and atraumatic.     Mouth/Throat:     Mouth: Mucous membranes are dry.  Eyes:     Pupils: Pupils are equal, round, and reactive to light.  Cardiovascular:     Rate and Rhythm: Normal rate and regular rhythm.     Heart sounds: No murmur heard. Pulmonary:     Effort: Pulmonary effort is normal. No respiratory distress.     Breath sounds: Normal breath sounds.  Abdominal:     General: Abdomen is flat.     Palpations: Abdomen is soft.     Tenderness: There is no abdominal tenderness.  Musculoskeletal:        General: No tenderness.     Right lower leg: No edema.     Left  lower leg: No  edema.  Skin:    General: Skin is warm and dry.  Neurological:     General: No focal deficit present.     Mental Status: She is alert. Mental status is at baseline.     Comments: Cranial nerves II through XII intact, strength 5 out of 5 in the bilateral upper and lower extremities, no sensory deficit to light touch  Psychiatric:        Mood and Affect: Mood normal.        Behavior: Behavior normal.     ED Results and Treatments Labs (all labs ordered are listed, but only abnormal results are displayed) Labs Reviewed  COMPREHENSIVE METABOLIC PANEL - Abnormal; Notable for the following components:      Result Value   Glucose, Bld 118 (*)    Creatinine, Ser 1.10 (*)    Albumin 3.4 (*)    GFR, Estimated 48 (*)    All other components within normal limits  URINALYSIS, W/ REFLEX TO CULTURE (INFECTION SUSPECTED) - Abnormal; Notable for the following components:   Hgb urine dipstick MODERATE (*)    Protein, ur 30 (*)    All other components within normal limits  CBC WITH DIFFERENTIAL/PLATELET                                                                                                                          Radiology CT Head Wo Contrast  Result Date: 11/06/2022 CLINICAL DATA:  Headache, new onset (Age >= 51y) EXAM: CT HEAD WITHOUT CONTRAST TECHNIQUE: Contiguous axial images were obtained from the base of the skull through the vertex without intravenous contrast. RADIATION DOSE REDUCTION: This exam was performed according to the departmental dose-optimization program which includes automated exposure control, adjustment of the mA and/or kV according to patient size and/or use of iterative reconstruction technique. COMPARISON:  CT head 06/24/13 FINDINGS: Brain: No evidence of acute infarction, hemorrhage, hydrocephalus, extra-axial collection or mass lesion/mass effect. There is sequela of mild chronic microvascular ischemic change. Vascular: No hyperdense vessel or unexpected  calcification. Skull: Normal. Negative for fracture or focal lesion. Sinuses/Orbits: No middle ear or mastoid effusion. Paranasal sinuses are clear. Bilateral lens replacement. Orbits are otherwise unremarkable. Other: None. IMPRESSION: No acute intracranial abnormality. Electronically Signed   By: Lorenza Cambridge M.D.   On: 11/06/2022 18:31    Pertinent labs & imaging results that were available during my care of the patient were reviewed by me and considered in my medical decision making (see MDM for details).  Medications Ordered in ED Medications  sodium chloride 0.9 % bolus 1,000 mL (1,000 mLs Intravenous New Bag/Given 11/06/22 1653)  Procedures Procedures  (including critical care time)  Medical Decision Making / ED Course   MDM:  89 year old female presenting to the emergency department diarrhea.  Patient well-appearing, physical exam reassuring with no abdominal tenderness, reassuring neurologic exam.  Appears mildly dehydrated.  Differential includes viral illness, food related illness.  Less likely invasive diarrhea without abdominal pain or bloody stools.  No recent antibiotic use to suggest C. difficile.  There was some concern about mild headache and hypertension although this is improved and her blood pressure in the emergency department is improved.  Given age will obtain CT head but low concern for acute intracranial process.  Will check basic labs to evaluate for electrolyte abnormality.  No abdominal tenderness to suggest acute intra-abdominal process such as colitis, abscess, diverticulitis, obstruction, appendicitis.  Patient reports intermittent dysuria, denies currently, will check urinalysis.  Clinical Course as of 11/06/22 1904  Tue Nov 06, 2022  1610 Patient feels better after IV fluids.  Lab test notable for mild AKI, creatinine went from  less than 1-1.1.  Advise close follow-up with primary physician.  No further episodes of diarrhea in the emergency department.  CT head negative and patient denies any ongoing headache. Will discharge patient to home. All questions answered. Patient comfortable with plan of discharge. Return precautions discussed with patient and specified on the after visit summary.  [WS]    Clinical Course User Index [WS] Lonell Grandchild, MD     Additional history obtained: -Additional history obtained from ems -External records from outside source obtained and reviewed including: Chart review including previous notes, labs, imaging, consultation notes including prior ER visit for dizziness   Lab Tests: -I ordered, reviewed, and interpreted labs.   The pertinent results include:   Labs Reviewed  COMPREHENSIVE METABOLIC PANEL - Abnormal; Notable for the following components:      Result Value   Glucose, Bld 118 (*)    Creatinine, Ser 1.10 (*)    Albumin 3.4 (*)    GFR, Estimated 48 (*)    All other components within normal limits  URINALYSIS, W/ REFLEX TO CULTURE (INFECTION SUSPECTED) - Abnormal; Notable for the following components:   Hgb urine dipstick MODERATE (*)    Protein, ur 30 (*)    All other components within normal limits  CBC WITH DIFFERENTIAL/PLATELET    Notable for very mild AKI. No UTI  Imaging Studies ordered: I ordered imaging studies including CT head On my interpretation imaging demonstrates no acute process I independently visualized and interpreted imaging. I agree with the radiologist interpretation   Medicines ordered and prescription drug management: Meds ordered this encounter  Medications   sodium chloride 0.9 % bolus 1,000 mL    -I have reviewed the patients home medicines and have made adjustments as needed    Cardiac Monitoring: The patient was maintained on a cardiac monitor.  I personally viewed and interpreted the cardiac monitored which showed an  underlying rhythm of: NSR  Social Determinants of Health:  Diagnosis or treatment significantly limited by social determinants of health: obesity   Reevaluation: After the interventions noted above, I reevaluated the patient and found that their symptoms have improved  Co morbidities that complicate the patient evaluation  Past Medical History:  Diagnosis Date   Anxiety    Arthritis    Diabetes mellitus without complication (HCC)    Headache    High cholesterol    Hypertension       Dispostion: Disposition decision including need for hospitalization was  considered, and patient discharged from emergency department.    Final Clinical Impression(s) / ED Diagnoses Final diagnoses:  Diarrhea, unspecified type     This chart was dictated using voice recognition software.  Despite best efforts to proofread,  errors can occur which can change the documentation meaning.    Lonell Grandchild, MD 11/06/22 [phone]

## 2022-11-16 DIAGNOSIS — R110 Nausea: Secondary | ICD-10-CM | POA: Diagnosis not present

## 2022-11-16 DIAGNOSIS — R590 Localized enlarged lymph nodes: Secondary | ICD-10-CM | POA: Diagnosis not present

## 2022-11-16 DIAGNOSIS — D320 Benign neoplasm of cerebral meninges: Secondary | ICD-10-CM | POA: Diagnosis not present

## 2022-11-16 DIAGNOSIS — Z0000 Encounter for general adult medical examination without abnormal findings: Secondary | ICD-10-CM | POA: Diagnosis not present

## 2022-11-16 DIAGNOSIS — E7800 Pure hypercholesterolemia, unspecified: Secondary | ICD-10-CM | POA: Diagnosis not present

## 2022-11-16 DIAGNOSIS — Z794 Long term (current) use of insulin: Secondary | ICD-10-CM | POA: Diagnosis not present

## 2022-11-16 DIAGNOSIS — E1165 Type 2 diabetes mellitus with hyperglycemia: Secondary | ICD-10-CM | POA: Diagnosis not present

## 2022-11-16 DIAGNOSIS — E1122 Type 2 diabetes mellitus with diabetic chronic kidney disease: Secondary | ICD-10-CM | POA: Diagnosis not present

## 2022-11-16 DIAGNOSIS — N183 Chronic kidney disease, stage 3 unspecified: Secondary | ICD-10-CM | POA: Diagnosis not present

## 2022-11-16 DIAGNOSIS — R911 Solitary pulmonary nodule: Secondary | ICD-10-CM | POA: Diagnosis not present

## 2022-11-16 DIAGNOSIS — I10 Essential (primary) hypertension: Secondary | ICD-10-CM | POA: Diagnosis not present

## 2023-01-28 ENCOUNTER — Ambulatory Visit: Payer: Medicare Other

## 2023-01-28 DIAGNOSIS — Z1231 Encounter for screening mammogram for malignant neoplasm of breast: Secondary | ICD-10-CM | POA: Diagnosis not present

## 2023-01-29 ENCOUNTER — Ambulatory Visit: Payer: 59

## 2023-02-12 ENCOUNTER — Other Ambulatory Visit: Payer: Self-pay | Admitting: Internal Medicine

## 2023-02-12 DIAGNOSIS — R634 Abnormal weight loss: Secondary | ICD-10-CM

## 2023-02-12 DIAGNOSIS — K5909 Other constipation: Secondary | ICD-10-CM | POA: Diagnosis not present

## 2023-03-06 ENCOUNTER — Ambulatory Visit
Admission: RE | Admit: 2023-03-06 | Discharge: 2023-03-06 | Disposition: A | Discharge status: Home / Self Care | Payer: 59 | Source: Ambulatory Visit | Attending: Internal Medicine

## 2023-03-06 DIAGNOSIS — K6389 Other specified diseases of intestine: Secondary | ICD-10-CM | POA: Diagnosis not present

## 2023-03-06 DIAGNOSIS — K5909 Other constipation: Secondary | ICD-10-CM | POA: Diagnosis not present

## 2023-03-06 DIAGNOSIS — K769 Liver disease, unspecified: Secondary | ICD-10-CM | POA: Diagnosis not present

## 2023-03-06 DIAGNOSIS — I700 Atherosclerosis of aorta: Secondary | ICD-10-CM | POA: Diagnosis not present

## 2023-03-06 DIAGNOSIS — R634 Abnormal weight loss: Secondary | ICD-10-CM

## 2023-03-11 ENCOUNTER — Emergency Department (HOSPITAL_COMMUNITY)
Admission: EM | Admit: 2023-03-11 | Discharge: 2023-03-12 | Disposition: A | Discharge status: Home / Self Care | Payer: 59 | Attending: Emergency Medicine | Admitting: Emergency Medicine

## 2023-03-11 DIAGNOSIS — Z743 Need for continuous supervision: Secondary | ICD-10-CM | POA: Diagnosis not present

## 2023-03-11 DIAGNOSIS — J984 Other disorders of lung: Secondary | ICD-10-CM | POA: Diagnosis not present

## 2023-03-11 DIAGNOSIS — R918 Other nonspecific abnormal finding of lung field: Secondary | ICD-10-CM | POA: Insufficient documentation

## 2023-03-11 DIAGNOSIS — R6889 Other general symptoms and signs: Secondary | ICD-10-CM | POA: Diagnosis not present

## 2023-03-11 DIAGNOSIS — R911 Solitary pulmonary nodule: Secondary | ICD-10-CM | POA: Diagnosis not present

## 2023-03-11 DIAGNOSIS — R109 Unspecified abdominal pain: Secondary | ICD-10-CM | POA: Diagnosis not present

## 2023-03-11 DIAGNOSIS — Z7982 Long term (current) use of aspirin: Secondary | ICD-10-CM | POA: Insufficient documentation

## 2023-03-11 DIAGNOSIS — Z79899 Other long term (current) drug therapy: Secondary | ICD-10-CM | POA: Diagnosis not present

## 2023-03-11 DIAGNOSIS — R519 Headache, unspecified: Secondary | ICD-10-CM | POA: Diagnosis not present

## 2023-03-11 DIAGNOSIS — I6529 Occlusion and stenosis of unspecified carotid artery: Secondary | ICD-10-CM | POA: Diagnosis not present

## 2023-03-11 DIAGNOSIS — G4489 Other headache syndrome: Secondary | ICD-10-CM | POA: Diagnosis not present

## 2023-03-11 DIAGNOSIS — I771 Stricture of artery: Secondary | ICD-10-CM | POA: Diagnosis not present

## 2023-03-11 DIAGNOSIS — I10 Essential (primary) hypertension: Secondary | ICD-10-CM | POA: Insufficient documentation

## 2023-03-11 DIAGNOSIS — R739 Hyperglycemia, unspecified: Secondary | ICD-10-CM | POA: Diagnosis not present

## 2023-03-12 ENCOUNTER — Encounter (HOSPITAL_COMMUNITY): Payer: Self-pay | Admitting: Emergency Medicine

## 2023-03-12 ENCOUNTER — Emergency Department (HOSPITAL_COMMUNITY): Payer: 59

## 2023-03-12 ENCOUNTER — Other Ambulatory Visit: Payer: Self-pay

## 2023-03-12 DIAGNOSIS — I771 Stricture of artery: Secondary | ICD-10-CM | POA: Diagnosis not present

## 2023-03-12 DIAGNOSIS — R109 Unspecified abdominal pain: Secondary | ICD-10-CM | POA: Diagnosis not present

## 2023-03-12 DIAGNOSIS — R918 Other nonspecific abnormal finding of lung field: Secondary | ICD-10-CM | POA: Diagnosis not present

## 2023-03-12 DIAGNOSIS — J984 Other disorders of lung: Secondary | ICD-10-CM | POA: Diagnosis not present

## 2023-03-12 DIAGNOSIS — I6529 Occlusion and stenosis of unspecified carotid artery: Secondary | ICD-10-CM | POA: Diagnosis not present

## 2023-03-12 DIAGNOSIS — I10 Essential (primary) hypertension: Secondary | ICD-10-CM | POA: Diagnosis not present

## 2023-03-12 DIAGNOSIS — R519 Headache, unspecified: Secondary | ICD-10-CM | POA: Diagnosis not present

## 2023-03-12 LAB — CBC WITH DIFFERENTIAL/PLATELET
Abs Immature Granulocytes: 0.05 10*3/uL (ref 0.00–0.07)
Basophils Absolute: 0 10*3/uL (ref 0.0–0.1)
Basophils Relative: 0 %
Eosinophils Absolute: 0.1 10*3/uL (ref 0.0–0.5)
Eosinophils Relative: 1 %
HCT: 41.9 % (ref 36.0–46.0)
Hemoglobin: 13.6 g/dL (ref 12.0–15.0)
Immature Granulocytes: 1 %
Lymphocytes Relative: 24 %
Lymphs Abs: 2.2 10*3/uL (ref 0.7–4.0)
MCH: 28.8 pg (ref 26.0–34.0)
MCHC: 32.5 g/dL (ref 30.0–36.0)
MCV: 88.6 fL (ref 80.0–100.0)
Monocytes Absolute: 0.7 10*3/uL (ref 0.1–1.0)
Monocytes Relative: 8 %
Neutro Abs: 6.1 10*3/uL (ref 1.7–7.7)
Neutrophils Relative %: 66 %
Platelets: 145 10*3/uL — ABNORMAL LOW (ref 150–400)
RBC: 4.73 MIL/uL (ref 3.87–5.11)
RDW: 12.6 % (ref 11.5–15.5)
WBC: 9.3 10*3/uL (ref 4.0–10.5)
nRBC: 0 % (ref 0.0–0.2)

## 2023-03-12 LAB — URINALYSIS, ROUTINE W REFLEX MICROSCOPIC
Bacteria, UA: NONE SEEN
Bilirubin Urine: NEGATIVE
Glucose, UA: NEGATIVE mg/dL
Ketones, ur: NEGATIVE mg/dL
Leukocytes,Ua: NEGATIVE
Nitrite: NEGATIVE
Protein, ur: 100 mg/dL — AB
Specific Gravity, Urine: 1.003 — ABNORMAL LOW (ref 1.005–1.030)
pH: 7 (ref 5.0–8.0)

## 2023-03-12 LAB — COMPREHENSIVE METABOLIC PANEL
ALT: 16 U/L (ref 0–44)
AST: 18 U/L (ref 15–41)
Albumin: 3.5 g/dL (ref 3.5–5.0)
Alkaline Phosphatase: 59 U/L (ref 38–126)
Anion gap: 10 (ref 5–15)
BUN: 14 mg/dL (ref 8–23)
CO2: 25 mmol/L (ref 22–32)
Calcium: 9 mg/dL (ref 8.9–10.3)
Chloride: 99 mmol/L (ref 98–111)
Creatinine, Ser: 1.15 mg/dL — ABNORMAL HIGH (ref 0.44–1.00)
GFR, Estimated: 46 mL/min — ABNORMAL LOW (ref >60)
Glucose, Bld: 176 mg/dL — ABNORMAL HIGH (ref 70–99)
Potassium: 4.4 mmol/L (ref 3.5–5.1)
Sodium: 134 mmol/L — ABNORMAL LOW (ref 135–145)
Total Bilirubin: 0.5 mg/dL (ref 0.3–1.2)
Total Protein: 7 g/dL (ref 6.5–8.1)

## 2023-03-12 MED ORDER — TRIAMTERENE-HCTZ 37.5-25 MG PO TABS
1.0000 | ORAL_TABLET | Freq: Every morning | ORAL | Status: DC
  Administered 2023-03-12: 1 via ORAL
  Filled 2023-03-12: qty 1

## 2023-03-12 MED ORDER — LINAGLIPTIN 5 MG PO TABS
5.0000 mg | ORAL_TABLET | Freq: Every day | ORAL | Status: DC
  Administered 2023-03-12: 5 mg via ORAL
  Filled 2023-03-12: qty 1

## 2023-03-12 MED ORDER — AMLODIPINE BESYLATE 5 MG PO TABS
5.0000 mg | ORAL_TABLET | Freq: Every day | ORAL | Status: DC
  Administered 2023-03-12: 5 mg via ORAL
  Filled 2023-03-12: qty 1

## 2023-03-12 MED ORDER — INSULIN GLARGINE-YFGN 100 UNIT/ML ~~LOC~~ SOLN
34.0000 [IU] | Freq: Once | SUBCUTANEOUS | Status: AC
  Administered 2023-03-12: 34 [IU] via SUBCUTANEOUS
  Filled 2023-03-12: qty 0.34

## 2023-03-12 MED ORDER — IOHEXOL 350 MG/ML SOLN
75.0000 mL | Freq: Once | INTRAVENOUS | Status: AC | PRN
  Administered 2023-03-12: 75 mL via INTRAVENOUS

## 2023-03-12 MED ORDER — METFORMIN HCL 500 MG PO TABS
500.0000 mg | ORAL_TABLET | Freq: Every day | ORAL | Status: DC
  Administered 2023-03-12: 500 mg via ORAL
  Filled 2023-03-12: qty 1

## 2023-03-12 MED ORDER — ACETAMINOPHEN 325 MG PO TABS
650.0000 mg | ORAL_TABLET | Freq: Once | ORAL | Status: AC
  Administered 2023-03-12: 650 mg via ORAL
  Filled 2023-03-12: qty 2

## 2023-03-12 MED ORDER — SITAGLIPTIN PHOS-METFORMIN HCL 50-500 MG PO TABS: 1.0000 | ORAL_TABLET | Freq: Every day | ORAL | Status: DC

## 2023-03-12 MED ORDER — AMLODIPINE BESYLATE 5 MG PO TABS
5.0000 mg | ORAL_TABLET | Freq: Once | ORAL | Status: AC
  Administered 2023-03-12: 5 mg via ORAL
  Filled 2023-03-12: qty 1

## 2023-03-12 MED ORDER — LOSARTAN POTASSIUM 50 MG PO TABS
50.0000 mg | ORAL_TABLET | Freq: Once | ORAL | Status: AC
  Administered 2023-03-12: 50 mg via ORAL
  Filled 2023-03-12: qty 1

## 2023-03-12 NOTE — ED Notes (Signed)
Family (daughter Marchelle Folks) updated as to patient's status. Contact number [phone].

## 2023-03-12 NOTE — Progress Notes (Signed)
Per IB message from referring ER provider to Dr.Mohamed, pt's daughter wants to be contacted regarding the referral placed to [facility] for a 2nd opinion. I reached out to the daughter. No answer. LVM with my desk number requesting a return call.

## 2023-03-12 NOTE — ED Triage Notes (Signed)
Pt presents via [facility] for HTN and headache that started tonight. Per EMS, pt is compliant with meds but had a BP of 210/100. Pt also endorsing some frequent urination. A&Ox4 at this time. Denies CP or SOB.

## 2023-03-12 NOTE — ED Provider Notes (Signed)
EMERGENCY DEPARTMENT AT [facility] Provider Note   CSN: [id number] Arrival date & time: 03/11/23  2359     History  Chief Complaint  Patient presents with   Hypertension   Headache    Rhonda Conrad is a 89 y.o. female.  89 year old female who presents ER today with hypertension and headache.  Patient states that she has a history of hypertension generally runs 130-150 systolic.  Today she noticed that her blood pressure was higher in the 200s systolic.  She then developed a headache and took a Tylenol and then to get any better so she presents here for further evaluation.  No vision changes, neurologic changes, chest pain, shortness of breath, new extremity swelling.  No other associated symptoms.  Gets headaches here and there but with the blood pressure being high and having a headache she wanted to get evaluated.  Compliant with medications.   Hypertension Associated symptoms include headaches.  Headache      Home Medications Prior to Admission medications   Medication Sig Start Date End Date Taking? Authorizing Provider  acetaminophen (TYLENOL) 500 MG tablet Take 500 mg by mouth every 8 (eight) hours as needed for mild pain or headache.     [provider]  ALPRAZolam Prudy Feeler) 0.5 MG tablet Take 0.5 mg by mouth 3 (three) times daily as needed for anxiety.  02/12/14   [provider]  amLODipine (NORVASC) 10 MG tablet Take 10 mg by mouth daily.    [provider]  aspirin EC 81 MG EC tablet Take 1 tablet (81 mg total) by mouth daily. 06/15/13   Christiane Ha, MD  benzonatate (TESSALON) 100 MG capsule Take 100 mg by mouth 3 (three) times daily as needed for cough.    [provider]  busPIRone (BUSPAR) 7.5 MG tablet Take 7.5 mg by mouth 2 (two) times daily.    [provider]  colchicine (COLCRYS) 0.6 MG tablet Take 0.6 mg by mouth 2 (two) times daily as needed (gout).     [provider]   diclofenac sodium (VOLTAREN) 1 % GEL Apply 2 g topically 2 (two) times daily as needed (arthritic pain).    [provider]  HYDROcodone-acetaminophen (NORCO/VICODIN) 5-325 MG tablet Take 1-2 tablets by mouth every 4 (four) hours as needed for moderate pain or severe pain. 01/28/17   Chevis Pretty III, MD  insulin glargine (LANTUS) 100 UNIT/ML injection Inject 34 Units into the skin 2 (two) times daily.     [provider]  losartan (COZAAR) 100 MG tablet Take 100 mg by mouth daily 02/07/16   [provider]  ondansetron (ZOFRAN) 8 MG tablet Take 8 mg by mouth twice daily as needed for nausea 11/19/16   [provider]  oxyCODONE-acetaminophen (PERCOCET) 7.5-325 MG per tablet Take 0.5-1 tablets by mouth 2 (two) times daily as needed for severe pain.     [provider]  pantoprazole (PROTONIX) 40 MG tablet Take 40 mg by mouth 2 (two) times daily as needed (heartburn).     [provider]  simvastatin (ZOCOR) 20 MG tablet Take 20 mg by mouth every evening.    [provider]  sitaGLIPtin-metformin (JANUMET) 50-500 MG per tablet Take 1 tablet by mouth daily.    [provider]  tiZANidine (ZANAFLEX) 2 MG tablet Take 2 mg by mouth at bedtime as needed for muscle pain 11/05/16   [provider]  triamterene-hydrochlorothiazide (MAXZIDE-25) 37.5-25 MG tablet Take 1 tablet  by mouth in the morning 11/05/16   [provider]      Allergies    Penicillins    Review of Systems   Review of Systems  Neurological:  Positive for headaches.    Physical Exam Updated Vital Signs BP (!) 196/78 (BP Location: Left Arm)   Pulse 81   Temp 98.4 F (36.9 C) (Oral)   Resp 18   Ht 5\' 2"  (1.575 m)   Wt 89.2 kg   SpO2 99%   BMI 35.97 kg/m  Physical Exam Vitals and nursing note reviewed.  Constitutional:      Appearance: She is well-developed.  HENT:     Head: Normocephalic and atraumatic.  Cardiovascular:     Rate and  Rhythm: Normal rate and regular rhythm.  Pulmonary:     Effort: No respiratory distress.     Breath sounds: No stridor.  Abdominal:     General: There is no distension.  Musculoskeletal:     Cervical back: Normal range of motion.  Neurological:     Mental Status: She is alert. Mental status is at baseline.     Cranial Nerves: No cranial nerve deficit.     Sensory: No sensory deficit.     Motor: No weakness.  Psychiatric:        Speech: Speech normal.        Behavior: Behavior normal.     ED Results / Procedures / Treatments   Labs (all labs ordered are listed, but only abnormal results are displayed) Labs Reviewed  CBC WITH DIFFERENTIAL/PLATELET  COMPREHENSIVE METABOLIC PANEL  URINALYSIS, ROUTINE W REFLEX MICROSCOPIC    EKG None  Radiology No results found.  Procedures Procedures    Medications Ordered in ED Medications  acetaminophen (TYLENOL) tablet 650 mg (has no administration in time range)  amLODipine (NORVASC) tablet 5 mg (has no administration in time range)    ED Course/ Medical Decision Making/ A&P                                 Medical Decision Making Amount and/or Complexity of Data Reviewed Labs: ordered. Radiology: ordered.  Risk OTC drugs. Prescription drug management.  Headache w/ worsening HTN. Headache may be related to the worry about the BP as she noticed the BP first then headache started however will need to evaluate for symptomatic HTN. No indication for troponins, bnp, xr without other symptoms. Labs done just for renal function and CTA.   On my personal interpretation, Head CT doesn't show obvious acute stroke or significant vascular blockage. Radiology read reviewed where they note a likely meningioma (reviewing records she had an MRI in the last year confirming same) but also some upper chest masses recommending ct chest wo contrast to further elucidate.   Ct shows enlarging masses from 2018. It appears she saw an oncologist  last year where they noted stability of one mass and not able to confirm malignancy of largest mass 2/2 association with great vessels. All of these seem to have gotten biggter in the last year. Previously had decided not to pursue 2/2 age and stability. Will d/w daughter per patient request in order to figure out next course of treatment.    {Document critical care time when appropriate:1} {Document review of labs and clinical decision tools ie heart score, Chads2Vasc2 etc:1}  {Document your independent review of radiology images, and any outside records:1} {Document your discussion with family members,  caretakers, and with consultants:1} {Document social determinants of health affecting pt's care:1} {Document your decision making why or why not admission, treatments were needed:1} Final Clinical Impression(s) / ED Diagnoses Final diagnoses:  None    Rx / DC Orders ED Discharge Orders     None

## 2023-03-12 NOTE — ED Notes (Signed)
Pt provided a sandwich & water by EDP.

## 2023-03-12 NOTE — ED Notes (Signed)
Pt back from CT

## 2023-03-12 NOTE — Discharge Instructions (Signed)
I have given you your morning insulin and blood pressure medications. You shouldn't need any more medications until later in the day.

## 2023-03-14 DIAGNOSIS — N183 Chronic kidney disease, stage 3 unspecified: Secondary | ICD-10-CM | POA: Diagnosis not present

## 2023-03-14 DIAGNOSIS — I10 Essential (primary) hypertension: Secondary | ICD-10-CM | POA: Diagnosis not present

## 2023-03-14 DIAGNOSIS — E7800 Pure hypercholesterolemia, unspecified: Secondary | ICD-10-CM | POA: Diagnosis not present

## 2023-03-14 DIAGNOSIS — Z23 Encounter for immunization: Secondary | ICD-10-CM | POA: Diagnosis not present

## 2023-03-14 DIAGNOSIS — Z794 Long term (current) use of insulin: Secondary | ICD-10-CM | POA: Diagnosis not present

## 2023-03-14 DIAGNOSIS — E1122 Type 2 diabetes mellitus with diabetic chronic kidney disease: Secondary | ICD-10-CM | POA: Diagnosis not present

## 2023-03-14 DIAGNOSIS — D320 Benign neoplasm of cerebral meninges: Secondary | ICD-10-CM | POA: Diagnosis not present

## 2023-03-14 DIAGNOSIS — E1165 Type 2 diabetes mellitus with hyperglycemia: Secondary | ICD-10-CM | POA: Diagnosis not present

## 2023-03-15 ENCOUNTER — Telehealth: Payer: Self-pay | Admitting: Physician Assistant

## 2023-03-15 NOTE — Telephone Encounter (Signed)
Received secure chat per Aundra Millet (navigator) to schedule patient on 10/21 @10 . Aundra Millet stated will contact daughter of scheduled appointment.

## 2023-03-15 NOTE — Progress Notes (Signed)
I emailed pts daughter, Darra Lis, with the details of the pt's appt. Dates, times, and address of the cancer center. I also called to make sure my email was received. Left VM with my desk number requesting she return my call if she needs to.

## 2023-03-20 NOTE — Progress Notes (Signed)
[facility] [phone]   [phone]  CONSULT NOTE  REFERRING PHYSICIAN: Dr. Clayborne Dana  REASON FOR CONSULTATION:  Lung Mass   HPI Rhonda Conrad is a 89 y.o. female with a past medical history significant for hypertension, diabetes, hyperlipidemia, and CKD is referred to the clinic for further workup of lung mass.  She presented to the emergency room on 03/12/2023 for hypertension and headache.  Her blood pressure was elevated that day and she was endorsing headache.  Part of her workup in the emergency room included CTA.  This was compared to a CT performed in 2018. The scan showed interval increase in the size of the left axillary mass with adjacent surgical clips.  This measures 6.5 x 5.7 cm (compared to 2018 which measured 5.5 x 3.8 cm noted to be concerning for nodal metastasis or lymphoproliferative disorder).  She also had interval increase in the size of lateral subsolid nodules largest in the right middle lobe measuring 2.7 cm with internal solid component 8 mm (which on previous exam was groundglass in appearance).  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  She also had a solid nodule within the left upper lobe that has increased in size concerning for primary bronchogenic carcinoma.  Of note, while in the emergency room she had a CT of the head without contrast that showed enlarging 2.1 x 1.4 cm mass in the right CP angle cistern which was present on prior imaging in 2015 and 2018 but has increased in size compared to prior.  This most likely represents meningioma.  The patient and her daughter are not interested in any intervention given her age.   Regarding the findings on the CT of the chest, she is referred to the clinic today.  Per chart review, looks like the patient had flow cytometry performed in August 2018 which did not show any monoclonal B-cell population.  There was also T cells with nonspecific  changes.  She also had a lymph node biopsy of the left axillary region in 2018 and all of the samples showed reactive lymphoid hyperplasia. She has had 4 core samples all negative for malignancy.    Overall she is feeling fair today today.  He is struggling with some back pain and has been seeing GI for some abdominal discomfort.  She has a follow-up appointment on 04/09/2023.  Per pain management of her back and leg, she takes oxycodone and gabapentin.  This is prescribed by her PCP Dr. Tiburcio Pea.  She denies any fever, chills, night sweats, or unexplained weight loss.  Denies any chest pain, shortness of breath, cough, or hemoptysis.  She does struggle with nausea.  She has been taking Metamucil and fiber.  Denies any headache or visual changes.  She does endorse some occasional left arm discomfort secondary to the axillary mass.  Regarding her family medical history, her mom had "heart trouble" and diabetes.  She is not sure medical problems her father and siblings had.  She did have a sister who had cancer but she is not sure the primary type.  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  She is single.  She has 1 child.  She is a former smoker having smoked approximately 20-30 years.  However 1 pack of cigarettes would last her approximately 1 a week.  HPI  Past Medical History:  Diagnosis Date   Anxiety    Arthritis    Diabetes mellitus without complication (HCC)  Headache    High cholesterol    Hypertension     Past Surgical History:  Procedure Laterality Date   AXILLARY LYMPH NODE BIOPSY Left 01/28/2017   Procedure: LEFT AXILLARY LYMPH NODE BIOPSY;  Surgeon: Griselda Miner, MD;  Location: [facility] OR;  Service: General;  Laterality: Left;  RNFA   CARDIAC CATHETERIZATION     LEFT HEART CATHETERIZATION WITH CORONARY ANGIOGRAM N/A 06/15/2013   Procedure: LEFT HEART CATHETERIZATION WITH CORONARY ANGIOGRAM;  Surgeon: Robynn Pane, MD;  Location: [facility] CATH LAB;  Service: Cardiovascular;   Laterality: N/A;   vaginal polyp removal      No family history on file.  Social History Social History   Tobacco Use   Smoking status: Former   Smokeless tobacco: Never  Substance Use Topics   Alcohol use: No   Drug use: No    Allergies  Allergen Reactions   Penicillins Anaphylaxis, Hives, Rash and Other (See Comments)    Has patient had a PCN reaction causing immediate rash, facial/tongue/throat swelling, SOB or lightheadedness with hypotension: Yes Has patient had a PCN reaction causing severe rash involving mucus membranes or skin necrosis: Yes Has patient had a PCN reaction that required hospitalization No Has patient had a PCN reaction occurring within the last 10 years: Yes If all of the above answers are "NO", then may proceed with Cephalosporin use.     Current Outpatient Medications  Medication Sig Dispense Refill   acetaminophen (TYLENOL) 500 MG tablet Take 500 mg by mouth every 8 (eight) hours as needed for mild pain or headache.      ALPRAZolam (XANAX) 0.5 MG tablet Take 0.5 mg by mouth 3 (three) times daily as needed for anxiety.   3   amLODipine (NORVASC) 10 MG tablet Take 10 mg by mouth daily.     aspirin EC 81 MG EC tablet Take 1 tablet (81 mg total) by mouth daily.     benzonatate (TESSALON) 100 MG capsule Take 100 mg by mouth 3 (three) times daily as needed for cough.     busPIRone (BUSPAR) 7.5 MG tablet Take 7.5 mg by mouth 2 (two) times daily.     colchicine (COLCRYS) 0.6 MG tablet Take 0.6 mg by mouth 2 (two) times daily as needed (gout).      diclofenac sodium (VOLTAREN) 1 % GEL Apply 2 g topically 2 (two) times daily as needed (arthritic pain).     HYDROcodone-acetaminophen (NORCO/VICODIN) 5-325 MG tablet Take 1-2 tablets by mouth every 4 (four) hours as needed for moderate pain or severe pain. 15 tablet 0   insulin glargine (LANTUS) 100 UNIT/ML injection Inject 34 Units into the skin 2 (two) times daily.      losartan (COZAAR) 100 MG tablet Take 100 mg  by mouth daily  2   ondansetron (ZOFRAN) 8 MG tablet Take 8 mg by mouth twice daily as needed for nausea  2   oxyCODONE-acetaminophen (PERCOCET) 7.5-325 MG per tablet Take 0.5-1 tablets by mouth 2 (two) times daily as needed for severe pain.      pantoprazole (PROTONIX) 40 MG tablet Take 40 mg by mouth 2 (two) times daily as needed (heartburn).      simvastatin (ZOCOR) 20 MG tablet Take 20 mg by mouth every evening.     sitaGLIPtin-metformin (JANUMET) 50-500 MG per tablet Take 1 tablet by mouth daily.     tiZANidine (ZANAFLEX) 2 MG tablet Take 2 mg by mouth at bedtime as needed for muscle pain  3  triamterene-hydrochlorothiazide (MAXZIDE-25) 37.5-25 MG tablet Take 1 tablet by mouth in the morning  0   No current facility-administered medications for this visit.    REVIEW OF SYSTEMS:   Review of Systems  Constitutional: Negative for appetite change, chills, fatigue, fever and unexpected weight change.  HENT: Negative for mouth sores, nosebleeds, sore throat and trouble swallowing.   Eyes: Negative for eye problems and icterus.  Respiratory: Negative for cough, hemoptysis, shortness of breath and wheezing.   Cardiovascular: Negative for chest pain and leg swelling.  Gastrointestinal: Positive for occasional lower abdominal discomfort, constipation, nausea.  Negative for vomiting.  Genitourinary: Negative for bladder incontinence, difficulty urinating, dysuria, frequency and hematuria.   Musculoskeletal: Positive for low back pain that radiates down her leg.  Positive for occasional left arm pain.  Negative for gait problem, neck pain and neck stiffness.  Skin: Negative for itching and rash.  Neurological: Negative for dizziness, extremity weakness, gait problem, headaches, light-headedness and seizures.  Hematological: Negative for adenopathy. Does not bruise/bleed easily.  Psychiatric/Behavioral: Negative for confusion, depression and sleep disturbance. The patient is not nervous/anxious.      PHYSICAL EXAMINATION:  There were no vitals taken for this visit.  ECOG PERFORMANCE STATUS: 2  Physical Exam  Constitutional: Oriented to person, place, and time and well-developed, well-nourished, and in no distress.  HENT:  Head: Normocephalic and atraumatic.  Mouth/Throat: Oropharynx is clear and moist. No oropharyngeal exudate.  Eyes: Conjunctivae are normal. Right eye exhibits no discharge. Left eye exhibits no discharge. No scleral icterus.  Neck: Normal range of motion. Neck supple.  Cardiovascular: Normal rate, regular rhythm, normal heart sounds and intact distal pulses.   Pulmonary/Chest: Effort normal and breath sounds normal. No respiratory distress. No wheezes. No rales.  Abdominal: Soft. Bowel sounds are normal. Exhibits no distension and no mass. There is no tenderness.  Musculoskeletal: Normal range of motion. Exhibits no edema.  Lymphadenopathy:    No cervical adenopathy.  Neurological: Alert and oriented to person, place, and time. Exhibits muscle wasting.  She was examined in the wheelchair.  Skin: Skin is warm and dry. No rash noted. Not diaphoretic. No erythema. No pallor.  Psychiatric: Mood, memory and judgment normal.  Vitals reviewed.  LABORATORY DATA: Lab Results  Component Value Date   WBC 9.3 03/12/2023   HGB 13.6 03/12/2023   HCT 41.9 03/12/2023   MCV 88.6 03/12/2023   PLT 145 (L) 03/12/2023      Chemistry      Component Value Date/Time   NA 134 (L) 03/12/2023 0045   K 4.4 03/12/2023 0045   CL 99 03/12/2023 0045   CO2 25 03/12/2023 0045   BUN 14 03/12/2023 0045   CREATININE 1.15 (H) 03/12/2023 0045      Component Value Date/Time   CALCIUM 9.0 03/12/2023 0045   ALKPHOS 59 03/12/2023 0045   AST 18 03/12/2023 0045   ALT 16 03/12/2023 0045   BILITOT 0.5 03/12/2023 0045       RADIOGRAPHIC STUDIES: CT ABDOMEN PELVIS WO CONTRAST  Result Date: 03/12/2023 CLINICAL DATA:  Chronic constipation. EXAM: CT ABDOMEN AND PELVIS WITHOUT CONTRAST  TECHNIQUE: Multidetector CT imaging of the abdomen and pelvis was performed following the standard protocol without IV contrast. RADIATION DOSE REDUCTION: This exam was performed according to the departmental dose-optimization program which includes automated exposure control, adjustment of the mA and/or kV according to patient size and/or use of iterative reconstruction technique. COMPARISON:  PET-CT from 11/23/2016 FINDINGS: Lower chest: No acute abnormality. See report  for chest CT dated 03/12/2023. Hepatobiliary: Contour the liver is slightly irregular. No focal liver abnormality. Gallbladder appears within normal limits. No bile duct dilatation. Pancreas: Unremarkable. No pancreatic ductal dilatation or surrounding inflammatory changes. Spleen: Normal in size without focal abnormality. Adrenals/Urinary Tract: Mild left adrenal thickening. Normal right adrenal gland. Bilateral kidney cysts are identified. The largest cyst arises off the anterior cortex of the right kidney measuring 2.4 cm. No follow-up imaging recommended. No nephrolithiasis or hydronephrosis identified. Urinary bladder appears decompressed. No focal abnormality. Stomach/Bowel: Stomach is within normal limits. The appendix is visualized and appears normal. There is no pathologic dilatation of the large or small bowel loops to suggest obstruction. No bowel wall inflammation. Mild wall thickening involving the distal transverse colon to the rectum is identified without surrounding inflammatory change. These loops appear decompressed. Vascular/Lymphatic: Aortic atherosclerosis. No signs of abdominopelvic adenopathy. Reproductive: Uterus and bilateral adnexa are unremarkable. Other: No ascites or focal fluid collections identified. No signs of pneumoperitoneum. Musculoskeletal: No acute or suspicious osseous findings. Lumbar spondylosis noted. IMPRESSION: 1. No acute findings within the abdomen or pelvis. 2. Mild wall thickening involving the distal  transverse colon to the rectum is identified without surrounding inflammatory change. These loops appear decompressed. Findings likely favor underdistention versus mild colitis. 3. Slightly irregular contour of the liver. Correlate for any clinical signs or symptoms of cirrhosis. 4.  Aortic Atherosclerosis (ICD10-I70.0). Electronically Signed   By: Signa Kell M.D.   On: 03/12/2023 05:21   CT Chest Wo Contrast  Result Date: 03/12/2023 CLINICAL DATA:  Evaluate lung nodule. Abdominal pain and constipation. * Tracking Code: BO * EXAM: CT CHEST WITHOUT CONTRAST TECHNIQUE: Multidetector CT imaging of the chest was performed following the standard protocol without IV contrast. RADIATION DOSE REDUCTION: This exam was performed according to the departmental dose-optimization program which includes automated exposure control, adjustment of the mA and/or kV according to patient size and/or use of iterative reconstruction technique. COMPARISON:  PET-CT from 11/23/16. FINDINGS: CT CHEST FINDINGS Cardiovascular: Heart size is normal. No pericardial effusion. Aortic atherosclerosis. Mediastinum/Nodes: Trachea appears patent and is midline. Normal appearance of the esophagus. Within the left axilla there is a large mass with adjacent surgical clips measuring 6.5 by 5.7 cm, image 35/3. On the exam from 11/12/2016 this measured 5.5 by 3.8 cm. No enlarged right axillary or supraclavicular lymph nodes. Lungs/Pleura: No pleural fluid or airspace consolidation. -Lobular nodule within the periphery of the left upper lobe measures 1.9 cm, image 30/4. Previously this nodule measured 0.9 cm. There are multiple, bilateral sub solid lung nodules: -the largest is in the right middle lobe measuring 2.7 cm with internal solid component of 8 mm, image 60/4. On the previous exam this was ground-glass in appearance measuring 1.9 cm. -Non solid nodule in the right middle lobe measures 1.4 cm, image 72/4. Formally 1.1 cm. Musculoskeletal: No  chest wall mass or suspicious bone lesions identified. IMPRESSION: 1. Interval increase in size of left axillary mass with adjacent surgical clips measuring 6.5 x 5.7 cm. On the exam from 11/12/2016 this measured 5.5 x 3.8 cm. Findings are concerning for either nodal metastasis or lymphoproliferative disorder. 2. Interval increase in size of bilateral sub solid lung nodules. The largest is in the right middle lobe measuring 2.7 cm with internal solid component of 8 mm. On the previous exam this was ground-glass in appearance measuring 1.9 cm. 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 3. Solid nodule within the left upper lobe has increased in size in  the interval and is also concerning for primary bronchogenic carcinoma. 4.  Aortic Atherosclerosis (ICD10-I70.0). Electronically Signed   By: Signa Kell M.D.   On: 03/12/2023 05:04   CT ANGIO HEAD NECK W WO CM  Result Date: 03/12/2023 CLINICAL DATA:  Initial evaluation for acute headache. EXAM: CT ANGIOGRAPHY HEAD AND NECK WITH AND WITHOUT CONTRAST TECHNIQUE: Multidetector CT imaging of the head and neck was performed using the standard protocol during bolus administration of intravenous contrast. Multiplanar CT image reconstructions and MIPs were obtained to evaluate the vascular anatomy. Carotid stenosis measurements (when applicable) are obtained utilizing NASCET criteria, using the distal internal carotid diameter as the denominator. RADIATION DOSE REDUCTION: This exam was performed according to the departmental dose-optimization program which includes automated exposure control, adjustment of the mA and/or kV according to patient size and/or use of iterative reconstruction technique. CONTRAST:  75mL OMNIPAQUE IOHEXOL 350 MG/ML SOLN COMPARISON:  Prior study from 11/06/2022. FINDINGS: CT HEAD FINDINGS Brain: Cerebral volume within normal limits for patient age. No evidence for acute intracranial hemorrhage. No findings to suggest acute  large vessel territory infarct. Approximate 2.3 cm extra-axial mass at the right CP angle cistern, likely a meningioma. No significant edema or mass effect seen within the adjacent brain parenchyma. No other visible mass lesion. No midline shift. No hydrocephalus or extra-axial fluid collection. Vascular: No hyperdense vessel identified.Scattered vascular calcifications noted within the carotid siphons. Skull: Scalp soft tissues demonstrate no acute abnormality. Calvarium intact. Sinuses/Orbits: Globes and orbital soft tissues within normal limits. Visualized paranasal sinuses are clear. No mastoid effusion. CTA NECK FINDINGS Aortic arch: Visualized aortic arch within normal limits for caliber with standard branch pattern. Minor atheromatous change about the arch itself. No stenosis. Right carotid system: Right common and internal carotid arteries are tortuous without dissection or stenosis. Left carotid system: Left common and internal carotid arteries are tortuous without dissection or stenosis. Vertebral arteries: Both vertebral arteries arise from the subclavian arteries. No proximal subclavian artery stenosis. Left vertebral artery dominant. Vertebral arteries are tortuous but patent without stenosis or dissection. Skeleton: No worrisome osseous lesions. Moderately advanced multilevel cervical spondylosis. Other neck: No other acute finding. Multinodular thyroid with dominant 1.9 cm left thyroid nodule. These have been previously evaluated by thyroid ultrasound on 11/19/2016 (ref: J Am Coll Radiol. 2015 Feb;12(2): 143-50). Upper chest: 1.9 cm pulmonary nodule present within the left upper lobe (series 9, image 8). Few additional scattered pulmonary nodule seen within the adjacent left upper lobe, largest of which measures 6 mm (series 9, image 20). Review of the MIP images confirms the above findings CTA HEAD FINDINGS Anterior circulation: Mild atheromatous change about the carotid siphons without stenosis. A1  segments, anterior communicating artery complex common anterior cerebral arteries patent without stenosis. No M1 stenosis or occlusion. Distal MCA branches perfused and symmetric. Posterior circulation: Both V4 segments patent without stenosis. Both PICA patent. Basilar patent without stenosis. Superior cerebellar and posterior cerebral arteries widely patent bilaterally. Venous sinuses: Patent allowing for timing the contrast bolus. Anatomic variants: As above.  No intracranial aneurysm. Review of the MIP images confirms the above findings IMPRESSION: CT HEAD IMPRESSION: 1. No acute intracranial abnormality. 2. 2.3 cm extra-axial mass at the right CP angle cistern, likely a meningioma. No significant edema or mass effect within the adjacent brain parenchyma. CTA HEAD AND NECK IMPRESSION: 1. Negative CTA for large vessel occlusion or other emergent finding. No intracranial aneurysm. 2. Mild atheromatous change about the carotid siphons without stenosis. 3. Diffuse tortuosity of  the major arterial vasculature of the head and neck, suggesting chronic underlying hypertension. 4. Few scattered left upper lobe pulmonary nodules, largest of which measures 1.9 cm. These are incompletely assessed on this exam. Per Fleischner Society Guidelines, recommend prompt non-contrast Chest CT for further evaluation. Electronically Signed   By: Rise Mu M.D.   On: 03/12/2023 03:07    ASSESSMENT: This a very pleasant 89 year old African-American female with left axillary mass and increase in size of bilateral subsolid lung nodules the largest in the right middle lobe measuring 2.7 cm with internal solid component of 8 mm.  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  Of note the patient did have prior workup with PET scan in 2018 under the care of her PCP.  She had a PET scan at  that time that showed mildly hypermetabolic left axillary mass suspicious for localized adenopathy and lymphoma was favored.  She also had small bilateral pulmonary nodules that were below the PET resolution threshold.  Of note her prior biopsies of the left axillary mass were negative for malignancy and prior flow cytometry was negative for monoclonal population.  The patient was seen with Dr. Arbutus Ped today.  Dr. Arbutus Ped had a lengthy discussion with the patient today about her current condition and workup.  Dr. Arbutus Ped recommends referral to pulmonary medicine for bronchoscopy and biopsy of the lungs. If positive for malignancy, would recommend molecular studies. The patient has had the axillary lesion biopsied with several core samples in the past which have been negative for malignancy.  The patient mentions that this was a bad experience for her as the biopsy was painful.  He also recommends repeat PET scan.   We will see her back for a follow-up visit in approximately 3 weeks to review the results and discuss the next steps in her care.  She is expected to follow-up with GI on 04/09/2023.  She will continue her pain management with her PCP with oxycodone and gabapentin at nighttime.  The patient voices understanding of current disease status and treatment options and is in agreement with the current care plan.  All questions were answered. The patient knows to call the clinic with any problems, questions or concerns. We can certainly see the patient much sooner if necessary.  Thank you so much for allowing me to participate in the care of Rhonda Conrad. I will continue to follow up the patient with you and assist in her care.   Disclaimer: This note was dictated with voice recognition software. Similar sounding words can inadvertently be transcribed and may not be corrected upon review.   Aalani Aikens L Angello Chien March 20, 2023, 2:47 PM  ADDENDUM: Hematology/Oncology  Attending: I had a face-to-face encounter with the patient today.  I reviewed her record, lab, scans and recommended her care plan.  This is a very pleasant 89 years old African-American female with history of chronic kidney disease, diabetes mellitus, hypertension, dyslipidemia.  The patient has a history of axillary lymphadenopathy that was seen several times in the past and evaluated with biopsy that was unremarkable.  She presented to the emergency department on 03/12/2023 with hypertension and headache and during her evaluation she had CT scan of the chest.  When compared to the previous scan in 2018 it showed increase in the size of the left axillary mass that currently measured 6.5 x 5.7 cm  compared to 5.5 x 3.8 cm in 2018 again concerning for nodal metastasis or lymphoproliferative disorder.  There was also interval increase in size of bilateral subsolid nodules including a lobular nodule within the periphery of the left upper lobe measuring 1.9 cm compared to 0.9 cm previously.  There were multiple bilateral subsolid lung nodules the largest in the right middle lobe measuring 2.7 cm with an internal solid component of 0.8 cm.  There was also a nonsolid nodule in the right middle lobe measuring 1.4 cm.  Was referred to me today for evaluation and recommendation regarding her bilateral pulmonary nodules and left axillary lymphadenopathy. I had a lengthy discussion with the patient and her daughter today about her current condition and further investigation to confirm her diagnosis. I recommended for the patient to have a PET scan further evaluation of her disease. I will refer her to pulmonary medicine for consideration of bronchoscopy and biopsy of some of the bilateral pulmonary nodules. We will see the patient back for follow-up visit in around 3 weeks for evaluation and discussion of her biopsy and imaging studies. She was advised to call immediately if she has any other concerning symptoms in the  interval. The total time spent in the appointment was 60 minutes. Disclaimer: This note was dictated with voice recognition software. Similar sounding words can inadvertently be transcribed and may be missed upon review. Lajuana Matte, MD

## 2023-03-21 ENCOUNTER — Other Ambulatory Visit: Payer: Self-pay | Admitting: Physician Assistant

## 2023-03-21 DIAGNOSIS — R918 Other nonspecific abnormal finding of lung field: Secondary | ICD-10-CM

## 2023-03-25 ENCOUNTER — Inpatient Hospital Stay: Payer: 59

## 2023-03-25 ENCOUNTER — Inpatient Hospital Stay: Payer: 59 | Attending: Physician Assistant | Admitting: Physician Assistant

## 2023-03-25 VITALS — BP 146/64 | HR 57 | Temp 98.1°F | Resp 17 | Wt 178.5 lb

## 2023-03-25 DIAGNOSIS — Z87891 Personal history of nicotine dependence: Secondary | ICD-10-CM | POA: Diagnosis not present

## 2023-03-25 DIAGNOSIS — R918 Other nonspecific abnormal finding of lung field: Secondary | ICD-10-CM | POA: Insufficient documentation

## 2023-03-25 DIAGNOSIS — C3490 Malignant neoplasm of unspecified part of unspecified bronchus or lung: Secondary | ICD-10-CM

## 2023-03-25 DIAGNOSIS — N189 Chronic kidney disease, unspecified: Secondary | ICD-10-CM

## 2023-03-25 DIAGNOSIS — E1122 Type 2 diabetes mellitus with diabetic chronic kidney disease: Secondary | ICD-10-CM

## 2023-03-25 DIAGNOSIS — R2230 Localized swelling, mass and lump, unspecified upper limb: Secondary | ICD-10-CM

## 2023-03-25 DIAGNOSIS — I129 Hypertensive chronic kidney disease with stage 1 through stage 4 chronic kidney disease, or unspecified chronic kidney disease: Secondary | ICD-10-CM | POA: Diagnosis not present

## 2023-03-25 LAB — CBC WITH DIFFERENTIAL (CANCER CENTER ONLY)
Abs Immature Granulocytes: 0.03 10*3/uL (ref 0.00–0.07)
Basophils Absolute: 0 10*3/uL (ref 0.0–0.1)
Basophils Relative: 0 %
Eosinophils Absolute: 0.2 10*3/uL (ref 0.0–0.5)
Eosinophils Relative: 2 %
HCT: 39.7 % (ref 36.0–46.0)
Hemoglobin: 13.2 g/dL (ref 12.0–15.0)
Immature Granulocytes: 0 %
Lymphocytes Relative: 35 %
Lymphs Abs: 3.1 10*3/uL (ref 0.7–4.0)
MCH: 29.5 pg (ref 26.0–34.0)
MCHC: 33.2 g/dL (ref 30.0–36.0)
MCV: 88.6 fL (ref 80.0–100.0)
Monocytes Absolute: 0.8 10*3/uL (ref 0.1–1.0)
Monocytes Relative: 9 %
Neutro Abs: 4.9 10*3/uL (ref 1.7–7.7)
Neutrophils Relative %: 54 %
Platelet Count: 199 10*3/uL (ref 150–400)
RBC: 4.48 MIL/uL (ref 3.87–5.11)
RDW: 12.8 % (ref 11.5–15.5)
WBC Count: 9 10*3/uL (ref 4.0–10.5)
nRBC: 0 % (ref 0.0–0.2)

## 2023-03-25 LAB — CMP (CANCER CENTER ONLY)
ALT: 8 U/L (ref 0–44)
AST: 12 U/L — ABNORMAL LOW (ref 15–41)
Albumin: 3.8 g/dL (ref 3.5–5.0)
Alkaline Phosphatase: 41 U/L (ref 38–126)
Anion gap: 8 (ref 5–15)
BUN: 24 mg/dL — ABNORMAL HIGH (ref 8–23)
CO2: 26 mmol/L (ref 22–32)
Calcium: 9.4 mg/dL (ref 8.9–10.3)
Chloride: 103 mmol/L (ref 98–111)
Creatinine: 1.32 mg/dL — ABNORMAL HIGH (ref 0.44–1.00)
GFR, Estimated: 39 mL/min — ABNORMAL LOW (ref >60)
Glucose, Bld: 120 mg/dL — ABNORMAL HIGH (ref 70–99)
Potassium: 3.9 mmol/L (ref 3.5–5.1)
Sodium: 137 mmol/L (ref 135–145)
Total Bilirubin: 0.6 mg/dL (ref 0.3–1.2)
Total Protein: 7.2 g/dL (ref 6.5–8.1)

## 2023-03-25 LAB — LACTATE DEHYDROGENASE: LDH: 154 U/L (ref 98–192)

## 2023-04-05 ENCOUNTER — Encounter (HOSPITAL_COMMUNITY)
Admission: RE | Admit: 2023-04-05 | Discharge: 2023-04-05 | Disposition: A | Discharge status: Home / Self Care | Payer: 59 | Source: Ambulatory Visit | Attending: Physician Assistant | Admitting: Physician Assistant

## 2023-04-05 DIAGNOSIS — C3490 Malignant neoplasm of unspecified part of unspecified bronchus or lung: Secondary | ICD-10-CM | POA: Insufficient documentation

## 2023-04-05 DIAGNOSIS — R918 Other nonspecific abnormal finding of lung field: Secondary | ICD-10-CM | POA: Diagnosis not present

## 2023-04-05 LAB — GLUCOSE, CAPILLARY: Glucose-Capillary: 109 mg/dL — ABNORMAL HIGH (ref 70–99)

## 2023-04-05 MED ORDER — FLUDEOXYGLUCOSE F - 18 (FDG) INJECTION
8.8600 | Freq: Once | INTRAVENOUS | Status: AC
  Administered 2023-04-05: 8.86 via INTRAVENOUS

## 2023-04-09 DIAGNOSIS — K5909 Other constipation: Secondary | ICD-10-CM | POA: Diagnosis not present

## 2023-04-09 DIAGNOSIS — R110 Nausea: Secondary | ICD-10-CM | POA: Diagnosis not present

## 2023-04-12 ENCOUNTER — Telehealth: Payer: Self-pay | Admitting: Medical Oncology

## 2023-04-12 NOTE — Telephone Encounter (Signed)
Dtr notified of change of appt to 11/25. Schedule message sent to make lab appt.

## 2023-04-13 ENCOUNTER — Emergency Department (HOSPITAL_COMMUNITY)
Admission: EM | Admit: 2023-04-13 | Discharge: 2023-04-14 | Disposition: A | Discharge status: Home / Self Care | Payer: 59 | Attending: Emergency Medicine | Admitting: Emergency Medicine

## 2023-04-13 ENCOUNTER — Encounter (HOSPITAL_COMMUNITY): Payer: Self-pay | Admitting: Emergency Medicine

## 2023-04-13 ENCOUNTER — Other Ambulatory Visit: Payer: Self-pay

## 2023-04-13 DIAGNOSIS — R600 Localized edema: Secondary | ICD-10-CM | POA: Diagnosis not present

## 2023-04-13 DIAGNOSIS — R6889 Other general symptoms and signs: Secondary | ICD-10-CM | POA: Diagnosis not present

## 2023-04-13 DIAGNOSIS — I10 Essential (primary) hypertension: Secondary | ICD-10-CM | POA: Insufficient documentation

## 2023-04-13 DIAGNOSIS — R197 Diarrhea, unspecified: Secondary | ICD-10-CM | POA: Diagnosis not present

## 2023-04-13 DIAGNOSIS — E041 Nontoxic single thyroid nodule: Secondary | ICD-10-CM | POA: Diagnosis not present

## 2023-04-13 DIAGNOSIS — Z0389 Encounter for observation for other suspected diseases and conditions ruled out: Secondary | ICD-10-CM | POA: Diagnosis not present

## 2023-04-13 DIAGNOSIS — R918 Other nonspecific abnormal finding of lung field: Secondary | ICD-10-CM | POA: Diagnosis not present

## 2023-04-13 DIAGNOSIS — Z7982 Long term (current) use of aspirin: Secondary | ICD-10-CM | POA: Insufficient documentation

## 2023-04-13 DIAGNOSIS — M7989 Other specified soft tissue disorders: Secondary | ICD-10-CM | POA: Diagnosis not present

## 2023-04-13 DIAGNOSIS — R7989 Other specified abnormal findings of blood chemistry: Secondary | ICD-10-CM | POA: Diagnosis not present

## 2023-04-13 DIAGNOSIS — R112 Nausea with vomiting, unspecified: Secondary | ICD-10-CM | POA: Diagnosis not present

## 2023-04-13 DIAGNOSIS — E119 Type 2 diabetes mellitus without complications: Secondary | ICD-10-CM | POA: Insufficient documentation

## 2023-04-13 DIAGNOSIS — M79604 Pain in right leg: Secondary | ICD-10-CM | POA: Diagnosis not present

## 2023-04-13 DIAGNOSIS — I700 Atherosclerosis of aorta: Secondary | ICD-10-CM | POA: Diagnosis not present

## 2023-04-13 LAB — URINALYSIS, ROUTINE W REFLEX MICROSCOPIC
Bilirubin Urine: NEGATIVE
Glucose, UA: NEGATIVE mg/dL
Ketones, ur: NEGATIVE mg/dL
Nitrite: NEGATIVE
Protein, ur: >=300 mg/dL — AB
Specific Gravity, Urine: 1.008 (ref 1.005–1.030)
pH: 6 (ref 5.0–8.0)

## 2023-04-13 LAB — COMPREHENSIVE METABOLIC PANEL
ALT: 14 U/L (ref 0–44)
AST: 21 U/L (ref 15–41)
Albumin: 3.4 g/dL — ABNORMAL LOW (ref 3.5–5.0)
Alkaline Phosphatase: 58 U/L (ref 38–126)
Anion gap: 7 (ref 5–15)
BUN: 16 mg/dL (ref 8–23)
CO2: 23 mmol/L (ref 22–32)
Calcium: 8.9 mg/dL (ref 8.9–10.3)
Chloride: 103 mmol/L (ref 98–111)
Creatinine, Ser: 1.17 mg/dL — ABNORMAL HIGH (ref 0.44–1.00)
GFR, Estimated: 45 mL/min — ABNORMAL LOW (ref >60)
Glucose, Bld: 113 mg/dL — ABNORMAL HIGH (ref 70–99)
Potassium: 4.1 mmol/L (ref 3.5–5.1)
Sodium: 133 mmol/L — ABNORMAL LOW (ref 135–145)
Total Bilirubin: 0.6 mg/dL (ref <1.2)
Total Protein: 6.8 g/dL (ref 6.5–8.1)

## 2023-04-13 LAB — CBC
HCT: 40.9 % (ref 36.0–46.0)
Hemoglobin: 13.2 g/dL (ref 12.0–15.0)
MCH: 28.9 pg (ref 26.0–34.0)
MCHC: 32.3 g/dL (ref 30.0–36.0)
MCV: 89.7 fL (ref 80.0–100.0)
Platelets: 167 10*3/uL (ref 150–400)
RBC: 4.56 MIL/uL (ref 3.87–5.11)
RDW: 12.7 % (ref 11.5–15.5)
WBC: 8.8 10*3/uL (ref 4.0–10.5)
nRBC: 0 % (ref 0.0–0.2)

## 2023-04-13 LAB — BRAIN NATRIURETIC PEPTIDE: B Natriuretic Peptide: 104.7 pg/mL — ABNORMAL HIGH (ref 0.0–100.0)

## 2023-04-13 LAB — D-DIMER, QUANTITATIVE: D-Dimer, Quant: >20 ug{FEU}/mL — ABNORMAL HIGH (ref 0.00–0.50)

## 2023-04-13 LAB — LIPASE, BLOOD: Lipase: 20 U/L (ref 11–51)

## 2023-04-13 MED ORDER — ONDANSETRON HCL 4 MG/2ML IJ SOLN
4.0000 mg | Freq: Once | INTRAMUSCULAR | Status: DC
  Filled 2023-04-13: qty 2

## 2023-04-13 MED ORDER — AMLODIPINE BESYLATE 5 MG PO TABS
10.0000 mg | ORAL_TABLET | Freq: Once | ORAL | Status: AC
  Administered 2023-04-13: 10 mg via ORAL
  Filled 2023-04-13: qty 2

## 2023-04-13 NOTE — ED Triage Notes (Signed)
Pt BIB [facility] with reports of bilateral leg swelling, hypertension, abdominal pain, and nausea x 4 days.

## 2023-04-13 NOTE — ED Notes (Signed)
Daughter Darra Lis [phone] would like an update immediately!

## 2023-04-13 NOTE — ED Provider Notes (Signed)
Received signout from previous provider, please see her note for complete H&P.  This is an 89 year old female who is currently being worked up for possible malignancy when she was found to have a lung nodule.  She is here with complaints of right leg swelling with associate nausea vomit diarrhea and elevated blood pressure.  Initial workup was concerning for a markedly elevated D-dimer of greater than 20.  A chest CT angiogram was obtained independently viewed interpreted by me and overall without any evidence of PE however patient does have multiple lung nodules concerning for malignancy.  I did discuss this finding with patient and with her daughter over the phone.  Patient received 10 mg of Eliquis by mouth during this visit and she will need to return tomorrow for a scheduled venous Doppler ultrasound to rule out DVT.  BP (!) 173/60   Pulse (!) 54   Temp 98.4 F (36.9 C) (Oral)   Resp 11   SpO2 100%   Results for orders placed or performed during the hospital encounter of 04/13/23  Lipase, blood  Result Value Ref Range   Lipase 20 11 - 51 U/L  Comprehensive metabolic panel  Result Value Ref Range   Sodium 133 (L) 135 - 145 mmol/L   Potassium 4.1 3.5 - 5.1 mmol/L   Chloride 103 98 - 111 mmol/L   CO2 23 22 - 32 mmol/L   Glucose, Bld 113 (H) 70 - 99 mg/dL   BUN 16 8 - 23 mg/dL   Creatinine, Ser 4.09 (H) 0.44 - 1.00 mg/dL   Calcium 8.9 8.9 - 81.1 mg/dL   Total Protein 6.8 6.5 - 8.1 g/dL   Albumin 3.4 (L) 3.5 - 5.0 g/dL   AST 21 15 - 41 U/L   ALT 14 0 - 44 U/L   Alkaline Phosphatase 58 38 - 126 U/L   Total Bilirubin 0.6 <1.2 mg/dL   GFR, Estimated 45 (L) >60 mL/min   Anion gap 7 5 - 15  CBC  Result Value Ref Range   WBC 8.8 4.0 - 10.5 K/uL   RBC 4.56 3.87 - 5.11 MIL/uL   Hemoglobin 13.2 12.0 - 15.0 g/dL   HCT 91.4 78.2 - 95.6 %   MCV 89.7 80.0 - 100.0 fL   MCH 28.9 26.0 - 34.0 pg   MCHC 32.3 30.0 - 36.0 g/dL   RDW 21.3 08.6 - 57.8 %   Platelets 167 150 - 400 K/uL   nRBC  0.0 0.0 - 0.2 %  Urinalysis, Routine w reflex microscopic -Urine, Clean Catch  Result Value Ref Range   Color, Urine YELLOW YELLOW   APPearance CLEAR CLEAR   Specific Gravity, Urine 1.008 1.005 - 1.030   pH 6.0 5.0 - 8.0   Glucose, UA NEGATIVE NEGATIVE mg/dL   Hgb urine dipstick LARGE (A) NEGATIVE   Bilirubin Urine NEGATIVE NEGATIVE   Ketones, ur NEGATIVE NEGATIVE mg/dL   Protein, ur >=469 (A) NEGATIVE mg/dL   Nitrite NEGATIVE NEGATIVE   Leukocytes,Ua MODERATE (A) NEGATIVE   RBC / HPF 6-10 0 - 5 RBC/hpf   WBC, UA 6-10 0 - 5 WBC/hpf   Bacteria, UA RARE (A) NONE SEEN   Squamous Epithelial / HPF 0-5 0 - 5 /HPF  D-dimer, quantitative  Result Value Ref Range   D-Dimer, Quant >20.00 (H) 0.00 - 0.50 ug/mL-FEU  Brain natriuretic peptide  Result Value Ref Range   B Natriuretic Peptide 104.7 (H) 0.0 - 100.0 pg/mL   CT Angio Chest  PE W and/or Wo Contrast  Result Date: 04/14/2023 CLINICAL DATA:  Pulmonary embolism suspected, low to intermediate probability, positive D-dimer. EXAM: CT ANGIOGRAPHY CHEST WITH CONTRAST TECHNIQUE: Multidetector CT imaging of the chest was performed using the standard protocol during bolus administration of intravenous contrast. Multiplanar CT image reconstructions and MIPs were obtained to evaluate the vascular anatomy. RADIATION DOSE REDUCTION: This exam was performed according to the departmental dose-optimization program which includes automated exposure control, adjustment of the mA and/or kV according to patient size and/or use of iterative reconstruction technique. CONTRAST:  75mL OMNIPAQUE IOHEXOL 350 MG/ML SOLN COMPARISON:  03/12/2023, 04/05/2023. FINDINGS: Cardiovascular: The heart is normal in size and there is a trace pericardial effusion. There is atherosclerotic calcification of the aorta without evidence of aneurysm. The pulmonary trunk is normal in caliber. No pulmonary embolus is seen. Mediastinum/Nodes: No mediastinal, hilar, or axillary lymphadenopathy.  Hypodense nodule is noted in the left lobe of the thyroid gland measuring 1.9 cm. The trachea and esophagus are within normal limits. Lungs/Pleura: There is a 1.8 cm nodule in the left upper lobe, axial image 30. Scattered subsolid nodules are present bilaterally, the largest measuring 3.1 x 1.1 cm in the right middle lobe, axial image 42. No effusion or pneumothorax is seen. Upper Abdomen: No acute abnormality. Musculoskeletal: There is partial visualization of a mass in the left axilla measuring 8.2 x 4.6 x 6.8 cm. Surgical clips are noted in the left axilla. Degenerative changes are present in the thoracic spine. No acute or suspicious osseous abnormality. Review of the MIP images confirms the above findings. IMPRESSION: 1. No evidence of pulmonary embolism or other acute process. 2. Bilateral solid and subsolid pulmonary nodules measuring up to 3.0 cm, concerning for malignancy. 3. Left axillary mass measuring 8.2 x 4.6 x 6.8 cm, increased in size from the prior exam, concerning for primary malignancy versus metastatic disease. 4. 1.9 cm hypodense nodule in the left lobe of the thyroid gland. Nonemergent thyroid ultrasound is recommended if clinically warranted given patient's age. 5. Aortic atherosclerosis. Electronically Signed   By: Thornell Sartorius M.D.   On: 04/14/2023 01:32      Fayrene Helper, PA-C 04/14/23 0205    Glynn Octave, MD 04/14/23 Cleophas Dunker

## 2023-04-13 NOTE — ED Provider Notes (Signed)
[place] [facility] AT [facility] Provider Note   CSN: [id number] Arrival date & time: 04/13/23  1801     History  Chief Complaint  Patient presents with   Hypertension    Rhonda Conrad is a 89 y.o. female with PMH HTN, diabetes, anxiety, lung nodule being worked up for possible malignancy currently with oncology/pulmonology as of last month who presents for N/V/D, R leg swelling, and high blood pressures.   N/V/D x3 days. No blood in either. Vomiting is 2x/day. Diarrhea is 2-3x/day. Abdomen does not hurt currently but has been on and off at epigastric/periumbilical region. No urinary changes or pain. No fevers or chills. Eating/drinking at baseline. Able to keep some food down. LBM was today. IS passing gas. Has lost 3-4 pounds in several months.  R leg is swollen today. This has been for 2 weeks. Was prevoiusly swollen larger, patient unsure why. No history of blood clots. Not on blood thinners. No chest pain, SOB, or weight gain. Leg is painful and this is new.   High blood pressure - has had HTN For years. 130-150s systolic normally. Takes amlodipine 10 mg, metoprolol 75 mg BID, benicar 40 mg. Has not missed any doses. Despite this BP has been running 168 systolic today. No chest pain, SOB, no urinary changes, but does have dull headache.   Lives at her apartment with her niece. Patient manages her medications. She is ambulatory with a cane. No issues walking recently.   The history is provided by the patient and medical records.       Home Medications Prior to Admission medications   Medication Sig Start Date End Date Taking? Authorizing Provider  acetaminophen (TYLENOL) 500 MG tablet Take 500 mg by mouth every 8 (eight) hours as needed for mild pain or headache.     [provider]  ALPRAZolam Prudy Feeler) 0.5 MG tablet Take 0.5 mg by mouth 3 (three) times daily as needed for anxiety.  02/12/14   [provider]  amLODipine (NORVASC)  10 MG tablet Take 10 mg by mouth daily.    [provider]  aspirin EC 81 MG EC tablet Take 1 tablet (81 mg total) by mouth daily. 06/15/13   Christiane Ha, MD  benzonatate (TESSALON) 100 MG capsule Take 100 mg by mouth 3 (three) times daily as needed for cough.    [provider]  busPIRone (BUSPAR) 7.5 MG tablet Take 7.5 mg by mouth 2 (two) times daily.    [provider]  colchicine (COLCRYS) 0.6 MG tablet Take 0.6 mg by mouth 2 (two) times daily as needed (gout).     [provider]  diclofenac sodium (VOLTAREN) 1 % GEL Apply 2 g topically 2 (two) times daily as needed (arthritic pain).    [provider]  HYDROcodone-acetaminophen (NORCO/VICODIN) 5-325 MG tablet Take 1-2 tablets by mouth every 4 (four) hours as needed for moderate pain or severe pain. 01/28/17   Chevis Pretty III, MD  insulin glargine (LANTUS) 100 UNIT/ML injection Inject 34 Units into the skin 2 (two) times daily.     [provider]  losartan (COZAAR) 100 MG tablet Take 100 mg by mouth daily 02/07/16   [provider]  ondansetron (ZOFRAN) 8 MG tablet Take 8 mg by mouth twice daily as needed for nausea 11/19/16   [provider]  oxyCODONE-acetaminophen (PERCOCET) 7.5-325 MG per tablet Take 0.5-1 tablets by mouth 2 (two) times daily as needed for severe pain.  [provider]  pantoprazole (PROTONIX) 40 MG tablet Take 40 mg by mouth 2 (two) times daily as needed (heartburn).     [provider]  simvastatin (ZOCOR) 20 MG tablet Take 20 mg by mouth every evening.    [provider]  sitaGLIPtin-metformin (JANUMET) 50-500 MG per tablet Take 1 tablet by mouth daily.    [provider]  tiZANidine (ZANAFLEX) 2 MG tablet Take 2 mg by mouth at bedtime as needed for muscle pain 11/05/16   [provider]  triamterene-hydrochlorothiazide (MAXZIDE-25) 37.5-25 MG tablet Take 1 tablet by mouth in the morning 11/05/16    [provider]      Allergies    Penicillins    Review of Systems   Review of Systems per HPI above  Physical Exam Updated Vital Signs BP (!) 173/60   Pulse (!) 54   Temp 98.4 F (36.9 C) (Oral)   Resp 11   SpO2 100%  Physical Exam Constitutional:      Appearance: Normal appearance. She is not ill-appearing.  HENT:     Head: Normocephalic and atraumatic.     Nose: Nose normal.     Mouth/Throat:     Mouth: Mucous membranes are moist.     Pharynx: Oropharynx is clear.  Eyes:     Extraocular Movements: Extraocular movements intact.     Pupils: Pupils are equal, round, and reactive to light.  Cardiovascular:     Rate and Rhythm: Normal rate and regular rhythm.     Pulses: Normal pulses.     Heart sounds: Normal heart sounds.  Pulmonary:     Effort: Pulmonary effort is normal.     Breath sounds: Normal breath sounds.  Abdominal:     General: There is no distension.     Palpations: Abdomen is soft.     Tenderness: There is no abdominal tenderness. There is no guarding or rebound.  Musculoskeletal:     Right lower leg: Edema present.     Left lower leg: Edema present.     Comments: Symmetric bilateral tibial and pedal edema. Nontender to palpation. No signs of cellulitis or abscess.   Skin:    General: Skin is warm and dry.     Capillary Refill: Capillary refill takes less than 2 seconds.  Neurological:     General: No focal deficit present.     Mental Status: She is alert. Mental status is at baseline.     Sensory: No sensory deficit.     Motor: No weakness.     Comments: Oriented to self, place, but not year (states 2004)     ED Results / Procedures / Treatments   Labs (all labs ordered are listed, but only abnormal results are displayed) Labs Reviewed  COMPREHENSIVE METABOLIC PANEL - Abnormal; Notable for the following components:      Result Value   Sodium 133 (*)    Glucose, Bld 113 (*)    Creatinine, Ser 1.17 (*)    Albumin 3.4 (*)    GFR,  Estimated 45 (*)    All other components within normal limits  URINALYSIS, ROUTINE W REFLEX MICROSCOPIC - Abnormal; Notable for the following components:   Hgb urine dipstick LARGE (*)    Protein, ur >=300 (*)    Leukocytes,Ua MODERATE (*)    Bacteria, UA RARE (*)    All other components within normal limits  D-DIMER, QUANTITATIVE - Abnormal; Notable for the following components:   D-Dimer, Quant >20.00 (*)  All other components within normal limits  BRAIN NATRIURETIC PEPTIDE - Abnormal; Notable for the following components:   B Natriuretic Peptide 104.7 (*)    All other components within normal limits  LIPASE, BLOOD  CBC    EKG EKG Interpretation Date/Time:  Saturday April 13 2023 20:34:08 EST Ventricular Rate:  65 PR Interval:  190 QRS Duration:  104 QT Interval:  404 QTC Calculation: 420 R Axis:   -3  Text Interpretation: Sinus rhythm No significant change since last tracing Confirmed by Gwyneth Sprout ([id number]) on 04/13/2023 8:54:53 PM  Radiology No results found.  Procedures Procedures    Medications Ordered in ED Medications  ondansetron (ZOFRAN) injection 4 mg (4 mg Intravenous Not Given 04/13/23 2315)  amLODipine (NORVASC) tablet 10 mg (10 mg Oral Given 04/13/23 2127)    ED Course/ Medical Decision Making/ A&P                                 Medical Decision Making Amount and/or Complexity of Data Reviewed Labs: ordered. Decision-making details documented in ED Course. Radiology: ordered. ECG/medicine tests: ordered and independent interpretation performed. Decision-making details documented in ED Course.  Risk Prescription drug management.   89 year old female who presents for multiple concerns.  She is initially hypertensive to 167/73 otherwise hemodynamically stable and afebrile.  For the nausea vomiting and diarrhea differential considered includes gastroenteritis, infectious colitis, bowel obstruction, abdominal surgical emergency,  pancreatitis, hepatobiliary disease, UTI or pyelonephritis, kidney stone.  Her abdominal exam is very reassuring with no tenderness anywhere.  Labs show no evidence of leukocytosis, normal lipase not consistent with pancreatitis, normal LFTs not consistent with hepatitis or biliary obstruction.  Her renal function is near her baseline and her glucose is 113 not consistent with dehydration, AKI, or electrolyte or metabolic derangement as a sequela of vomiting.  Her UA shows some blood which is potentially concerning for kidney stone, but she has had this on several prior UAs for several years.  She had a CT abdomen pelvis 1 month ago that did not show evidence of kidney stones.  Last scan showed nonspecific colitis.  The remainder of her UA shows moderate leuk esterase but very few WBC and negative nitrites not very convincing for UTI.  Additionally she has not been febrile and she does not have any suprapubic tenderness.  Based on her overall reassuring labs, absence of fevers and other systemic symptoms, and abdominal exam with no tenderness at all advanced imaging is not warranted at this encounter.  She may have a mild gastroenteritis that is causing 2-3 episodes of vomiting and diarrhea per day, however she is still taking in normal p.o. and having normal urine output without evidence of derangement on labs as above, or clinical evidence of dehydration.    For her blood pressure concerns, differential considered includes hypertensive emergency, dissection, benign hypertension.  I have a low concern for dissection based on her physical exam with symmetric pulses and normal neurofindings.  I have a low concern for hypertensive emergency as her blood pressure is only 160s systolic and this is not actually that far from her baseline.  She also does not have chest pain, shortness of breath, or urinary changes.  Her labs are without evidence of renal damage.  An EKG was obtained and shows NSR rate 65, normal axis  intervals, no STEMI, no pathologic TWI overall reassuring no ischemic changes.  I gave her a dose of  her home amlodipine 10 mg here.  I did not give her metoprolol because her heart rate is 60.  I was unable to give her Benicar as we do not have this on formulary here.   For her leg swelling, differential considered includes DVT, hypervolemia due to CHF or renal failure, cellulitis or abscess, or lymphedema.  Her labs are without evidence of renal failure.  Her physical exam does not suggest right worse than left swelling on my exam though patient thinks the right is worse.  Legs are nontender to palpation.  No signs of cellulitis or abscess.  D-dimer was obtained to risk stratify concern for clot, as at this hour she is not able to get a DVT ultrasound unfortunately. D dimer was elevated to >20. CTA PE was sent with plan of if small PE, d/c on Eliquis as presumably she has DVT's in the legs. If PE negative, then start Eliquis anyway and have her return tomorrow for DVT US. BNP was sent and very mildly elevated at 104.    At signout pending PE scan. Plan communicated with oncoming APP. See their note for further detail and MDM.          Final Clinical Impression(s) / ED Diagnoses Final diagnoses:  Hypertension, unspecified type  Leg swelling  Nausea vomiting and diarrhea    Rx / DC Orders ED Discharge Orders     None         Karmen Stabs, MD 04/13/23 2345    Gwyneth Sprout, MD 04/15/23 0101

## 2023-04-13 NOTE — ED Notes (Signed)
Tele tech called this RN ass a 2nd RN contact d/t pt having bradycardic episodes in to the 40's/ This RN unable to find this pts primary RN checked on PT. Pt pulse 52 at present and asymptomatic.

## 2023-04-14 ENCOUNTER — Ambulatory Visit (HOSPITAL_COMMUNITY)
Admission: RE | Admit: 2023-04-14 | Discharge: 2023-04-14 | Disposition: A | Discharge status: Home / Self Care | Payer: 59 | Source: Ambulatory Visit | Attending: Emergency Medicine | Admitting: Emergency Medicine

## 2023-04-14 ENCOUNTER — Emergency Department (HOSPITAL_COMMUNITY): Payer: 59

## 2023-04-14 DIAGNOSIS — M7989 Other specified soft tissue disorders: Secondary | ICD-10-CM | POA: Insufficient documentation

## 2023-04-14 DIAGNOSIS — Z0389 Encounter for observation for other suspected diseases and conditions ruled out: Secondary | ICD-10-CM | POA: Diagnosis not present

## 2023-04-14 DIAGNOSIS — R112 Nausea with vomiting, unspecified: Secondary | ICD-10-CM | POA: Diagnosis not present

## 2023-04-14 DIAGNOSIS — E041 Nontoxic single thyroid nodule: Secondary | ICD-10-CM | POA: Diagnosis not present

## 2023-04-14 DIAGNOSIS — R7989 Other specified abnormal findings of blood chemistry: Secondary | ICD-10-CM | POA: Insufficient documentation

## 2023-04-14 DIAGNOSIS — I700 Atherosclerosis of aorta: Secondary | ICD-10-CM | POA: Diagnosis not present

## 2023-04-14 DIAGNOSIS — R918 Other nonspecific abnormal finding of lung field: Secondary | ICD-10-CM | POA: Diagnosis not present

## 2023-04-14 MED ORDER — IOHEXOL 350 MG/ML SOLN
75.0000 mL | Freq: Once | INTRAVENOUS | Status: AC | PRN
  Administered 2023-04-14: 75 mL via INTRAVENOUS

## 2023-04-14 MED ORDER — APIXABAN 5 MG PO TABS
10.0000 mg | ORAL_TABLET | Freq: Once | ORAL | Status: AC
  Administered 2023-04-14: 10 mg via ORAL
  Filled 2023-04-14: qty 2

## 2023-04-14 NOTE — Discharge Instructions (Addendum)
You have been evaluated for your symptoms.  Fortunately CT scan of your chest did not show any evidence of any blood clot.  However, please return tomorrow to have a venous Doppler ultrasound of your legs to ensure you do not have any blood clot in your legs.

## 2023-04-15 ENCOUNTER — Inpatient Hospital Stay: Payer: 59 | Admitting: Internal Medicine

## 2023-04-15 ENCOUNTER — Inpatient Hospital Stay: Payer: 59

## 2023-04-18 DIAGNOSIS — M25561 Pain in right knee: Secondary | ICD-10-CM | POA: Diagnosis not present

## 2023-04-19 ENCOUNTER — Encounter: Payer: Self-pay | Admitting: Emergency Medicine

## 2023-04-19 ENCOUNTER — Ambulatory Visit (INDEPENDENT_AMBULATORY_CARE_PROVIDER_SITE_OTHER): Payer: 59 | Admitting: Emergency Medicine

## 2023-04-19 VITALS — BP 155/85 | HR 52 | Temp 98.4°F | Ht 64.0 in | Wt 177.2 lb

## 2023-04-19 DIAGNOSIS — E785 Hyperlipidemia, unspecified: Secondary | ICD-10-CM | POA: Diagnosis not present

## 2023-04-19 DIAGNOSIS — Z794 Long term (current) use of insulin: Secondary | ICD-10-CM

## 2023-04-19 DIAGNOSIS — E119 Type 2 diabetes mellitus without complications: Secondary | ICD-10-CM

## 2023-04-19 DIAGNOSIS — R918 Other nonspecific abnormal finding of lung field: Secondary | ICD-10-CM | POA: Diagnosis not present

## 2023-04-19 DIAGNOSIS — R2230 Localized swelling, mass and lump, unspecified upper limb: Secondary | ICD-10-CM | POA: Diagnosis not present

## 2023-04-19 DIAGNOSIS — N189 Chronic kidney disease, unspecified: Secondary | ICD-10-CM | POA: Diagnosis not present

## 2023-04-19 DIAGNOSIS — I10 Essential (primary) hypertension: Secondary | ICD-10-CM | POA: Diagnosis not present

## 2023-04-19 NOTE — Progress Notes (Signed)
Subjective:    Patient ID: Rhonda Conrad, female    DOB: 1931-07-03, 89 y.o.   MRN: [id number]  HPI The patient, an 89 year old former smoker (10 pk-yrs) with a history of hypertension, diabetes, hyperlipidemia, and chronic kidney disease, was referred for evaluation of abnormal findings on a CT scan of the chest. The scan was performed during an emergency room visit for headache and hypertension. The CT angiography revealed an increase in the size of a previously biopsied left axillary mass, now measuring 6.5 by 5.7 centimeters. Additionally, there were increased pulmonary nodules, including a lateral subsolid right middle lobe nodule measuring 2.7 centimeters with an 8 millimeter solid component. This nodule was previously completely ground glass on comparison CT.  Previous axillary lymph nodal biopsies were negative for malignancy and flow cytometry did not show a monoclonal population. The patient was seen by oncology 03/25/23 and referred for consideration of bronchoscopy for biopsy and tissue diagnosis.  The patient had another emergency department visit for nausea and chest discomfort. A subsequent CT angiography confirmed bilateral solid and subsolid pulmonary nodules measuring up to 3 centimeters, and an increased size of the left axillary mass. A 1.9 centimeter hypodense nodule was also noted in the left lobe of the thyroid.  The patient lives with a niece who assists with daily care. The patient has been experiencing nausea and gagging for approximately two months, but denies vomiting. The patient is able to perform daily tasks with assistance due to fatigue. The patient has no known exposure to inhaled substances such as fumes, dust, or chemicals, and has no known history of tuberculosis exposure or positive tuberculosis test.   PET scan 04/05/2023 reviewed by me shows hypermetabolism in the left upper lobe nodule, right middle lobe nodules CT angiography of the chest (04/14/2023): No  evidence of PE; confirmed bilateral solid and subsolid pulmonary nodules measuring up to 3 cm; left axillary mass 8.2 x 4.6 x 6.8 cm; 1.9 cm hypodense nodule in the left lobe of the thyroid. CT scan of the chest (03/12/2023): Left axillary mass with adjacent surgical clips; lobular nodule in the left upper lobe 1.9 cm increased from prior 0.9 cm; 2.7 cm ground glass nodule with an internal solid component of 8 mm in the right middle lobe increased from prior 1.9 cm; 1.4 cm ground glass right middle lobe nodule increased from 1.1 cm.  Review of Systems As per HPI  Past Medical History:  Diagnosis Date   Anxiety    Arthritis    Diabetes mellitus without complication (HCC)    Headache    High cholesterol    Hypertension      No family history on file.   Social History   Socioeconomic History   Marital status: Single    Spouse name: Not on file   Number of children: Not on file   Years of education: Not on file   Highest education level: Not on file  Occupational History   Not on file  Tobacco Use   Smoking status: Former   Smokeless tobacco: Never  Substance and Sexual Activity   Alcohol use: No   Drug use: No   Sexual activity: Not on file  Other Topics Concern   Not on file  Social History Narrative   Not on file   Social Determinants of Health   Financial Resource Strain: Low Risk  (02/08/2022)   Received from [facility], [facility]   Overall Financial Resource Strain (CARDIA)    Difficulty of  Paying Living Expenses: Not very hard  Food Insecurity: No Food Insecurity (02/28/2022)   Received from [facility], [facility]   Hunger Vital Sign    Worried About Running Out of Food in the Last Year: Never true    Ran Out of Food in the Last Year: Never true  Transportation Needs: No Transportation Needs (02/12/2022)   Received from [facility], [facility]   PRAPARE - Transportation    Lack of Transportation (Medical): No    Lack of Transportation  (Non-Medical): No  Physical Activity: Not on file  Stress: Stress Concern Present (02/08/2022)   Received from [facility], [facility] - Occupational Stress Questionnaire    Feeling of Stress : To some extent  Social Connections: Unknown (02/07/2022)   Received from [facility], [facility]   Social Network    Social Network: Not on file  Intimate Partner Violence: Unknown (02/07/2022)   Received from [facility], [facility]   HITS    Physically Hurt: Not on file    Insult or Talk Down To: Not on file    Threaten Physical Harm: Not on file    Scream or Curse: Not on file     Allergies  Allergen Reactions   Penicillins Anaphylaxis, Hives, Rash and Other (See Comments)    Has patient had a PCN reaction causing immediate rash, facial/tongue/throat swelling, SOB or lightheadedness with hypotension: Yes Has patient had a PCN reaction causing severe rash involving mucus membranes or skin necrosis: Yes Has patient had a PCN reaction that required hospitalization No Has patient had a PCN reaction occurring within the last 10 years: Yes If all of the above answers are "NO", then may proceed with Cephalosporin use.      Outpatient Medications Prior to Visit  Medication Sig Dispense Refill   acetaminophen (TYLENOL) 500 MG tablet Take 500 mg by mouth every 8 (eight) hours as needed for mild pain or headache.      ALPRAZolam (XANAX) 0.5 MG tablet Take 0.5 mg by mouth 3 (three) times daily as needed for anxiety.   3   amLODipine (NORVASC) 10 MG tablet Take 10 mg by mouth daily.     aspirin EC 81 MG EC tablet Take 1 tablet (81 mg total) by mouth daily.     benzonatate (TESSALON) 100 MG capsule Take 100 mg by mouth 3 (three) times daily as needed for cough.     busPIRone (BUSPAR) 7.5 MG tablet Take 7.5 mg by mouth 2 (two) times daily.     colchicine (COLCRYS) 0.6 MG tablet Take 0.6 mg by mouth 2 (two) times daily as needed (gout).       diclofenac sodium (VOLTAREN) 1 % GEL Apply 2 g topically 2 (two) times daily as needed (arthritic pain).     HYDROcodone-acetaminophen (NORCO/VICODIN) 5-325 MG tablet Take 1-2 tablets by mouth every 4 (four) hours as needed for moderate pain or severe pain. 15 tablet 0   insulin glargine (LANTUS) 100 UNIT/ML injection Inject 34 Units into the skin 2 (two) times daily.      losartan (COZAAR) 100 MG tablet Take 100 mg by mouth daily  2   ondansetron (ZOFRAN) 8 MG tablet Take 8 mg by mouth twice daily as needed for nausea  2   oxyCODONE-acetaminophen (PERCOCET) 7.5-325 MG per tablet Take 0.5-1 tablets by mouth 2 (two) times daily as needed for severe pain.      pantoprazole (PROTONIX) 40 MG tablet  Take 40 mg by mouth 2 (two) times daily as needed (heartburn).      simvastatin (ZOCOR) 20 MG tablet Take 20 mg by mouth every evening.     sitaGLIPtin-metformin (JANUMET) 50-500 MG per tablet Take 1 tablet by mouth daily.     tiZANidine (ZANAFLEX) 2 MG tablet Take 2 mg by mouth at bedtime as needed for muscle pain  3   triamterene-hydrochlorothiazide (MAXZIDE-25) 37.5-25 MG tablet Take 1 tablet by mouth in the morning  0   No facility-administered medications prior to visit.        Objective:   Physical Exam Vitals:   04/19/23 1057  BP: (!) 155/85  Pulse: (!) 52  Temp: 98.4 F (36.9 C)  TempSrc: Oral  SpO2: 96%  Weight: 177 lb 3.2 oz (80.4 kg)  Height: 5\' 4"  (1.626 m)   Gen: Pleasant, well-nourished elderly woman, in no distress,  normal affect  ENT: No lesions,  mouth clear,  oropharynx clear, no postnasal drip  Neck: No JVD, no stridor  Lungs: No use of accessory muscles, decreased at both bases but no crackles or wheezing on normal respiration, no wheeze on forced expiration  Cardiovascular: RRR, heart sounds normal, no murmur or gallops, no peripheral edema  Musculoskeletal: No deformities, no cyanosis or clubbing  Neuro: alert, awake, non focal  Skin: Warm, no lesions or  rash      Assessment & Plan:  Lung nodules Pulmonary Nodules Multiple bilateral pulmonary nodules, including a 2.7 cm ground glass nodule with an 8mm solid component in the right middle lobe and a 1.9 cm lobular nodule in the left upper lobe. Both nodules have increased in size from prior imaging. Suspicion for slow-growing lung cancer, but definitive diagnosis requires tissue biopsy. -Schedule bronchoscopy for biopsy and tissue diagnosis on May 06, 2023. -Hold aspirin for two days prior to the procedure.   Axillary mass Left Axillary Mass Interval increase in size of a previously biopsied left axillary mass, now measuring 8.2 x 4.6 x 6.8 cm. Prior biopsies were negative for malignancy. -Continue monitoring the size of the mass.  May be related to the pulmonary nodule disease, may require repeat biopsy.  Hypertension Hypertension Recent episodes of elevated blood pressure leading to emergency department visits. -Continue current management and monitor blood pressure.  DM (diabetes mellitus) Diabetes Managed with medication. -Continue current management.  Hyperlipidemia Hyperlipidemia Managed with medication. -Continue current management.  Chronic kidney disease Chronic Kidney Disease Managed with medication.   Levy Pupa, MD, PhD 04/19/2023, 11:42 AM [facility] [phone] or if no answer before 7:00PM call [phone] For any issues after 7:00PM please call eLink [phone]

## 2023-04-19 NOTE — Assessment & Plan Note (Signed)
Hyperlipidemia Managed with medication. -Continue current management.

## 2023-04-19 NOTE — Patient Instructions (Signed)
VISIT SUMMARY:  During today's visit, we reviewed the findings from your recent CT scans and discussed the next steps for addressing the abnormalities found in your lungs and left axillary area. We also reviewed your ongoing management for hypertension, diabetes, hyperlipidemia, and chronic kidney disease.  YOUR PLAN:  -PULMONARY NODULES: Pulmonary nodules are small growths in the lungs that can be benign or malignant. Your CT scan showed multiple nodules that have increased in size, raising suspicion for slow-growing lung cancer. We have scheduled a bronchoscopy for May 06, 2023, to obtain a tissue biopsy for a definitive diagnosis. Please hold aspirin for two days before the procedure.  -LEFT AXILLARY MASS: The left axillary mass is a growth in your armpit area that has increased in size. Previous biopsies were negative for cancer, but we will continue to monitor its size closely.  -HYPERTENSION: Hypertension is high blood pressure. You have had recent episodes of elevated blood pressure, and we will continue with your current management plan and monitor your blood pressure closely.  -DIABETES: Diabetes is a condition where your blood sugar levels are too high. We will continue with your current medication to manage this condition.  -HYPERLIPIDEMIA: Hyperlipidemia is having high levels of fats in your blood. We will continue with your current medication to manage this condition.  -CHRONIC KIDNEY DISEASE: Chronic kidney disease is a long-term condition where the kidneys do not work as well as they should. We will continue with your current medication to manage this condition.  INSTRUCTIONS:  Please remember to hold aspirin for two days before your bronchoscopy scheduled on May 06, 2023. Continue with your current medications for hypertension, diabetes, hyperlipidemia, and chronic kidney disease. Monitor your blood pressure regularly and report any significant changes or symptoms to [place].

## 2023-04-19 NOTE — Assessment & Plan Note (Signed)
Pulmonary Nodules Multiple bilateral pulmonary nodules, including a 2.7 cm ground glass nodule with an 8mm solid component in the right middle lobe and a 1.9 cm lobular nodule in the left upper lobe. Both nodules have increased in size from prior imaging. Suspicion for slow-growing lung cancer, but definitive diagnosis requires tissue biopsy. -Schedule bronchoscopy for biopsy and tissue diagnosis on May 06, 2023. -Hold aspirin for two days prior to the procedure.

## 2023-04-19 NOTE — H&P (View-Only) (Signed)
 Subjective:    Patient ID: Rhonda Conrad, female    DOB: 1931-07-03, 89 y.o.   MRN: [id number]  HPI The patient, an 89 year old former smoker (10 pk-yrs) with a history of hypertension, diabetes, hyperlipidemia, and chronic kidney disease, was referred for evaluation of abnormal findings on a CT scan of the chest. The scan was performed during an emergency room visit for headache and hypertension. The CT angiography revealed an increase in the size of a previously biopsied left axillary mass, now measuring 6.5 by 5.7 centimeters. Additionally, there were increased pulmonary nodules, including a lateral subsolid right middle lobe nodule measuring 2.7 centimeters with an 8 millimeter solid component. This nodule was previously completely ground glass on comparison CT.  Previous axillary lymph nodal biopsies were negative for malignancy and flow cytometry did not show a monoclonal population. The patient was seen by oncology 03/25/23 and referred for consideration of bronchoscopy for biopsy and tissue diagnosis.  The patient had another emergency department visit for nausea and chest discomfort. A subsequent CT angiography confirmed bilateral solid and subsolid pulmonary nodules measuring up to 3 centimeters, and an increased size of the left axillary mass. A 1.9 centimeter hypodense nodule was also noted in the left lobe of the thyroid.  The patient lives with a niece who assists with daily care. The patient has been experiencing nausea and gagging for approximately two months, but denies vomiting. The patient is able to perform daily tasks with assistance due to fatigue. The patient has no known exposure to inhaled substances such as fumes, dust, or chemicals, and has no known history of tuberculosis exposure or positive tuberculosis test.   PET scan 04/05/2023 reviewed by me shows hypermetabolism in the left upper lobe nodule, right middle lobe nodules CT angiography of the chest (04/14/2023): No  evidence of PE; confirmed bilateral solid and subsolid pulmonary nodules measuring up to 3 cm; left axillary mass 8.2 x 4.6 x 6.8 cm; 1.9 cm hypodense nodule in the left lobe of the thyroid. CT scan of the chest (03/12/2023): Left axillary mass with adjacent surgical clips; lobular nodule in the left upper lobe 1.9 cm increased from prior 0.9 cm; 2.7 cm ground glass nodule with an internal solid component of 8 mm in the right middle lobe increased from prior 1.9 cm; 1.4 cm ground glass right middle lobe nodule increased from 1.1 cm.  Review of Systems As per HPI  Past Medical History:  Diagnosis Date   Anxiety    Arthritis    Diabetes mellitus without complication (HCC)    Headache    High cholesterol    Hypertension      No family history on file.   Social History   Socioeconomic History   Marital status: Single    Spouse name: Not on file   Number of children: Not on file   Years of education: Not on file   Highest education level: Not on file  Occupational History   Not on file  Tobacco Use   Smoking status: Former   Smokeless tobacco: Never  Substance and Sexual Activity   Alcohol use: No   Drug use: No   Sexual activity: Not on file  Other Topics Concern   Not on file  Social History Narrative   Not on file   Social Determinants of Health   Financial Resource Strain: Low Risk  (02/08/2022)   Received from [facility], [facility]   Overall Financial Resource Strain (CARDIA)    Difficulty of  Paying Living Expenses: Not very hard  Food Insecurity: No Food Insecurity (02/28/2022)   Received from [facility], [facility]   Hunger Vital Sign    Worried About Running Out of Food in the Last Year: Never true    Ran Out of Food in the Last Year: Never true  Transportation Needs: No Transportation Needs (02/12/2022)   Received from [facility], [facility]   PRAPARE - Transportation    Lack of Transportation (Medical): No    Lack of Transportation  (Non-Medical): No  Physical Activity: Not on file  Stress: Stress Concern Present (02/08/2022)   Received from [facility], [facility] - Occupational Stress Questionnaire    Feeling of Stress : To some extent  Social Connections: Unknown (02/07/2022)   Received from [facility], [facility]   Social Network    Social Network: Not on file  Intimate Partner Violence: Unknown (02/07/2022)   Received from [facility], [facility]   HITS    Physically Hurt: Not on file    Insult or Talk Down To: Not on file    Threaten Physical Harm: Not on file    Scream or Curse: Not on file     Allergies  Allergen Reactions   Penicillins Anaphylaxis, Hives, Rash and Other (See Comments)    Has patient had a PCN reaction causing immediate rash, facial/tongue/throat swelling, SOB or lightheadedness with hypotension: Yes Has patient had a PCN reaction causing severe rash involving mucus membranes or skin necrosis: Yes Has patient had a PCN reaction that required hospitalization No Has patient had a PCN reaction occurring within the last 10 years: Yes If all of the above answers are "NO", then may proceed with Cephalosporin use.      Outpatient Medications Prior to Visit  Medication Sig Dispense Refill   acetaminophen (TYLENOL) 500 MG tablet Take 500 mg by mouth every 8 (eight) hours as needed for mild pain or headache.      ALPRAZolam (XANAX) 0.5 MG tablet Take 0.5 mg by mouth 3 (three) times daily as needed for anxiety.   3   amLODipine (NORVASC) 10 MG tablet Take 10 mg by mouth daily.     aspirin EC 81 MG EC tablet Take 1 tablet (81 mg total) by mouth daily.     benzonatate (TESSALON) 100 MG capsule Take 100 mg by mouth 3 (three) times daily as needed for cough.     busPIRone (BUSPAR) 7.5 MG tablet Take 7.5 mg by mouth 2 (two) times daily.     colchicine (COLCRYS) 0.6 MG tablet Take 0.6 mg by mouth 2 (two) times daily as needed (gout).       diclofenac sodium (VOLTAREN) 1 % GEL Apply 2 g topically 2 (two) times daily as needed (arthritic pain).     HYDROcodone-acetaminophen (NORCO/VICODIN) 5-325 MG tablet Take 1-2 tablets by mouth every 4 (four) hours as needed for moderate pain or severe pain. 15 tablet 0   insulin glargine (LANTUS) 100 UNIT/ML injection Inject 34 Units into the skin 2 (two) times daily.      losartan (COZAAR) 100 MG tablet Take 100 mg by mouth daily  2   ondansetron (ZOFRAN) 8 MG tablet Take 8 mg by mouth twice daily as needed for nausea  2   oxyCODONE-acetaminophen (PERCOCET) 7.5-325 MG per tablet Take 0.5-1 tablets by mouth 2 (two) times daily as needed for severe pain.      pantoprazole (PROTONIX) 40 MG tablet  Take 40 mg by mouth 2 (two) times daily as needed (heartburn).      simvastatin (ZOCOR) 20 MG tablet Take 20 mg by mouth every evening.     sitaGLIPtin-metformin (JANUMET) 50-500 MG per tablet Take 1 tablet by mouth daily.     tiZANidine (ZANAFLEX) 2 MG tablet Take 2 mg by mouth at bedtime as needed for muscle pain  3   triamterene-hydrochlorothiazide (MAXZIDE-25) 37.5-25 MG tablet Take 1 tablet by mouth in the morning  0   No facility-administered medications prior to visit.        Objective:   Physical Exam Vitals:   04/19/23 1057  BP: (!) 155/85  Pulse: (!) 52  Temp: 98.4 F (36.9 C)  TempSrc: Oral  SpO2: 96%  Weight: 177 lb 3.2 oz (80.4 kg)  Height: 5\' 4"  (1.626 m)   Gen: Pleasant, well-nourished elderly woman, in no distress,  normal affect  ENT: No lesions,  mouth clear,  oropharynx clear, no postnasal drip  Neck: No JVD, no stridor  Lungs: No use of accessory muscles, decreased at both bases but no crackles or wheezing on normal respiration, no wheeze on forced expiration  Cardiovascular: RRR, heart sounds normal, no murmur or gallops, no peripheral edema  Musculoskeletal: No deformities, no cyanosis or clubbing  Neuro: alert, awake, non focal  Skin: Warm, no lesions or  rash      Assessment & Plan:  Lung nodules Pulmonary Nodules Multiple bilateral pulmonary nodules, including a 2.7 cm ground glass nodule with an 8mm solid component in the right middle lobe and a 1.9 cm lobular nodule in the left upper lobe. Both nodules have increased in size from prior imaging. Suspicion for slow-growing lung cancer, but definitive diagnosis requires tissue biopsy. -Schedule bronchoscopy for biopsy and tissue diagnosis on May 06, 2023. -Hold aspirin for two days prior to the procedure.   Axillary mass Left Axillary Mass Interval increase in size of a previously biopsied left axillary mass, now measuring 8.2 x 4.6 x 6.8 cm. Prior biopsies were negative for malignancy. -Continue monitoring the size of the mass.  May be related to the pulmonary nodule disease, may require repeat biopsy.  Hypertension Hypertension Recent episodes of elevated blood pressure leading to emergency department visits. -Continue current management and monitor blood pressure.  DM (diabetes mellitus) Diabetes Managed with medication. -Continue current management.  Hyperlipidemia Hyperlipidemia Managed with medication. -Continue current management.  Chronic kidney disease Chronic Kidney Disease Managed with medication.   Levy Pupa, MD, PhD 04/19/2023, 11:42 AM [facility] [phone] or if no answer before 7:00PM call [phone] For any issues after 7:00PM please call eLink [phone]

## 2023-04-19 NOTE — Assessment & Plan Note (Signed)
Hypertension Recent episodes of elevated blood pressure leading to emergency department visits. -Continue current management and monitor blood pressure.

## 2023-04-19 NOTE — Assessment & Plan Note (Signed)
Chronic Kidney Disease Managed with medication.

## 2023-04-19 NOTE — Assessment & Plan Note (Signed)
Diabetes Managed with medication. -Continue current management.

## 2023-04-19 NOTE — Assessment & Plan Note (Signed)
Left Axillary Mass Interval increase in size of a previously biopsied left axillary mass, now measuring 8.2 x 4.6 x 6.8 cm. Prior biopsies were negative for malignancy. -Continue monitoring the size of the mass.  May be related to the pulmonary nodule disease, may require repeat biopsy.

## 2023-04-22 ENCOUNTER — Telehealth: Payer: Self-pay | Admitting: Physician Assistant

## 2023-04-22 ENCOUNTER — Telehealth: Payer: Self-pay | Admitting: Emergency Medicine

## 2023-04-22 NOTE — Telephone Encounter (Signed)
Spoke with patient's daughter Rhonda Conrad regarding prior message . Patient stated she has been feeling like something is eating up her insides for about 2 weeks now. Rhonda Conrad stated that patient's BP was and EMS was called last night patient declined to go to the Hospital . This same message has been sent to her PCP and GI doctor.   Dr.Byrum can you please advise   Thank you

## 2023-04-22 NOTE — Telephone Encounter (Signed)
PT daughter calling stating mom will do the surgery on December 2nd.  Daughter states some thing is "eating all over her body" Mom could not be more descriptive. Denies SOB,itching, new medications, pain, fever.   [phone] is daughter. Please call to advise.

## 2023-04-22 NOTE — Telephone Encounter (Signed)
The patient was scheduled to review her biopsy results on 04/29/2023.  Her biopsy is scheduled on 05/06/2023.  Therefore we will cancel her appointment in November and reschedule this for 05/13/2023.  I confirm the appointment time with her daughter.

## 2023-04-22 NOTE — Telephone Encounter (Signed)
I discussed patient's status with daughter Marchelle Folks by phone.  Difficult to tell exactly what the patient is feeling although certainly if she does feel unwell it could be related to what I presume is underlying synchronous primary lung cancers.  She knows to have the patient seek care or call report any new symptoms if she decompensates. We did confirm that she is going to keep the scheduled appointment for navigational bronchoscopy on 12/2.  We will keep this procedure on the books unless she declines in some way and we found that the bronchoscopy could not be done safely.

## 2023-04-23 ENCOUNTER — Emergency Department (HOSPITAL_COMMUNITY): Payer: 59

## 2023-04-23 ENCOUNTER — Other Ambulatory Visit: Payer: Self-pay

## 2023-04-23 ENCOUNTER — Encounter (HOSPITAL_COMMUNITY): Payer: Self-pay | Admitting: Emergency Medicine

## 2023-04-23 ENCOUNTER — Emergency Department (HOSPITAL_COMMUNITY)
Admission: EM | Admit: 2023-04-23 | Discharge: 2023-04-23 | Disposition: A | Discharge status: Home / Self Care | Payer: 59

## 2023-04-23 DIAGNOSIS — M4807 Spinal stenosis, lumbosacral region: Secondary | ICD-10-CM | POA: Diagnosis not present

## 2023-04-23 DIAGNOSIS — I700 Atherosclerosis of aorta: Secondary | ICD-10-CM | POA: Insufficient documentation

## 2023-04-23 DIAGNOSIS — R6889 Other general symptoms and signs: Secondary | ICD-10-CM | POA: Diagnosis not present

## 2023-04-23 DIAGNOSIS — G8929 Other chronic pain: Secondary | ICD-10-CM | POA: Diagnosis not present

## 2023-04-23 DIAGNOSIS — E871 Hypo-osmolality and hyponatremia: Secondary | ICD-10-CM | POA: Diagnosis not present

## 2023-04-23 DIAGNOSIS — Z794 Long term (current) use of insulin: Secondary | ICD-10-CM | POA: Diagnosis not present

## 2023-04-23 DIAGNOSIS — N281 Cyst of kidney, acquired: Secondary | ICD-10-CM | POA: Insufficient documentation

## 2023-04-23 DIAGNOSIS — M545 Low back pain, unspecified: Secondary | ICD-10-CM | POA: Diagnosis not present

## 2023-04-23 DIAGNOSIS — R918 Other nonspecific abnormal finding of lung field: Secondary | ICD-10-CM | POA: Insufficient documentation

## 2023-04-23 DIAGNOSIS — R109 Unspecified abdominal pain: Secondary | ICD-10-CM | POA: Diagnosis not present

## 2023-04-23 DIAGNOSIS — M5126 Other intervertebral disc displacement, lumbar region: Secondary | ICD-10-CM | POA: Diagnosis not present

## 2023-04-23 DIAGNOSIS — Z7982 Long term (current) use of aspirin: Secondary | ICD-10-CM | POA: Insufficient documentation

## 2023-04-23 DIAGNOSIS — M48061 Spinal stenosis, lumbar region without neurogenic claudication: Secondary | ICD-10-CM | POA: Diagnosis not present

## 2023-04-23 DIAGNOSIS — C799 Secondary malignant neoplasm of unspecified site: Secondary | ICD-10-CM | POA: Diagnosis not present

## 2023-04-23 DIAGNOSIS — R531 Weakness: Secondary | ICD-10-CM | POA: Diagnosis not present

## 2023-04-23 LAB — URINALYSIS, ROUTINE W REFLEX MICROSCOPIC
Bilirubin Urine: NEGATIVE
Glucose, UA: NEGATIVE mg/dL
Ketones, ur: NEGATIVE mg/dL
Leukocytes,Ua: NEGATIVE
Nitrite: NEGATIVE
Protein, ur: >=300 mg/dL — AB
Specific Gravity, Urine: 1.018 (ref 1.005–1.030)
pH: 5 (ref 5.0–8.0)

## 2023-04-23 LAB — CBC WITH DIFFERENTIAL/PLATELET
Abs Immature Granulocytes: 0.04 10*3/uL (ref 0.00–0.07)
Basophils Absolute: 0 10*3/uL (ref 0.0–0.1)
Basophils Relative: 0 %
Eosinophils Absolute: 0 10*3/uL (ref 0.0–0.5)
Eosinophils Relative: 0 %
HCT: 38.5 % (ref 36.0–46.0)
Hemoglobin: 12.9 g/dL (ref 12.0–15.0)
Immature Granulocytes: 0 %
Lymphocytes Relative: 15 %
Lymphs Abs: 1.5 10*3/uL (ref 0.7–4.0)
MCH: 28.7 pg (ref 26.0–34.0)
MCHC: 33.5 g/dL (ref 30.0–36.0)
MCV: 85.7 fL (ref 80.0–100.0)
Monocytes Absolute: 0.9 10*3/uL (ref 0.1–1.0)
Monocytes Relative: 9 %
Neutro Abs: 7.4 10*3/uL (ref 1.7–7.7)
Neutrophils Relative %: 76 %
Platelets: UNDETERMINED 10*3/uL (ref 150–400)
RBC: 4.49 MIL/uL (ref 3.87–5.11)
RDW: 12.3 % (ref 11.5–15.5)
WBC: 9.9 10*3/uL (ref 4.0–10.5)
nRBC: 0 % (ref 0.0–0.2)

## 2023-04-23 LAB — COMPREHENSIVE METABOLIC PANEL
ALT: 11 U/L (ref 0–44)
AST: 16 U/L (ref 15–41)
Albumin: 3.5 g/dL (ref 3.5–5.0)
Alkaline Phosphatase: 52 U/L (ref 38–126)
Anion gap: 13 (ref 5–15)
BUN: 8 mg/dL (ref 8–23)
CO2: 20 mmol/L — ABNORMAL LOW (ref 22–32)
Calcium: 9.3 mg/dL (ref 8.9–10.3)
Chloride: 95 mmol/L — ABNORMAL LOW (ref 98–111)
Creatinine, Ser: 0.76 mg/dL (ref 0.44–1.00)
GFR, Estimated: >60 mL/min (ref >60)
Glucose, Bld: 120 mg/dL — ABNORMAL HIGH (ref 70–99)
Potassium: 3.7 mmol/L (ref 3.5–5.1)
Sodium: 128 mmol/L — ABNORMAL LOW (ref 135–145)
Total Bilirubin: 0.8 mg/dL (ref <1.2)
Total Protein: 7 g/dL (ref 6.5–8.1)

## 2023-04-23 LAB — TROPONIN I (HIGH SENSITIVITY)
Troponin I (High Sensitivity): 20 ng/L — ABNORMAL HIGH (ref <18)
Troponin I (High Sensitivity): 19 ng/L — ABNORMAL HIGH (ref <18)

## 2023-04-23 LAB — TSH: TSH: 1.578 u[IU]/mL (ref 0.350–4.500)

## 2023-04-23 LAB — LIPASE, BLOOD: Lipase: 20 U/L (ref 11–51)

## 2023-04-23 MED ORDER — IOHEXOL 350 MG/ML SOLN
75.0000 mL | Freq: Once | INTRAVENOUS | Status: AC | PRN
  Administered 2023-04-23: 75 mL via INTRAVENOUS

## 2023-04-23 MED ORDER — OXYCODONE-ACETAMINOPHEN 5-325 MG PO TABS
1.0000 | ORAL_TABLET | Freq: Once | ORAL | Status: AC
  Administered 2023-04-23: 1 via ORAL
  Filled 2023-04-23: qty 1

## 2023-04-23 MED ORDER — AMLODIPINE BESYLATE 5 MG PO TABS
10.0000 mg | ORAL_TABLET | Freq: Once | ORAL | Status: AC
  Administered 2023-04-23: 10 mg via ORAL
  Filled 2023-04-23: qty 2

## 2023-04-23 MED ORDER — SODIUM CHLORIDE 0.9 % IV BOLUS
1000.0000 mL | Freq: Once | INTRAVENOUS | Status: AC
Start: 2023-04-23 — End: 2023-04-23
  Administered 2023-04-23: 1000 mL via INTRAVENOUS

## 2023-04-23 MED ORDER — ONDANSETRON HCL 4 MG/2ML IJ SOLN
4.0000 mg | Freq: Once | INTRAMUSCULAR | Status: AC
  Administered 2023-04-23: 4 mg via INTRAVENOUS
  Filled 2023-04-23: qty 2

## 2023-04-23 MED ORDER — LOSARTAN POTASSIUM 50 MG PO TABS
100.0000 mg | ORAL_TABLET | Freq: Once | ORAL | Status: AC
  Administered 2023-04-23: 100 mg via ORAL
  Filled 2023-04-23: qty 2

## 2023-04-23 MED ORDER — ONDANSETRON HCL 4 MG PO TABS: 4.0000 mg | ORAL_TABLET | Freq: Four times a day (QID) | ORAL | 0 refills | Status: DC

## 2023-04-23 NOTE — ED Provider Notes (Signed)
[facility] Provider Note   CSN: [id number] Arrival date & time: 04/23/23  0935     History  Chief Complaint  Patient presents with   Weakness    Rhonda Conrad is a 89 y.o. female.  Is an 89 year old female present emergency department with multiple complaints.  Seen in the year for generalized abdominal pain that she describes as pain that is "beating her up inside" and cannot further elaborate.  States it is all over.  Is been constant for the past week and a half or so.  Reports being nauseated, but no vomiting.  Decreased appetite.  No fevers or chills.  No chest pain shortness of breath.  Bowel movement last night.  Also complaining of subacute/chronic pain to right leg.  Reports pain from toes up need to anterior thigh.   Weakness      Home Medications Prior to Admission medications   Medication Sig Start Date End Date Taking? Authorizing Provider  acetaminophen (TYLENOL) 500 MG tablet Take 500 mg by mouth every 8 (eight) hours as needed for mild pain or headache.     [provider]  ALPRAZolam Prudy Feeler) 0.5 MG tablet Take 0.5 mg by mouth 3 (three) times daily as needed for anxiety.  02/12/14   [provider]  amLODipine (NORVASC) 10 MG tablet Take 10 mg by mouth daily.    [provider]  aspirin EC 81 MG EC tablet Take 1 tablet (81 mg total) by mouth daily. 06/15/13   Christiane Ha, MD  benzonatate (TESSALON) 100 MG capsule Take 100 mg by mouth 3 (three) times daily as needed for cough.    [provider]  busPIRone (BUSPAR) 7.5 MG tablet Take 7.5 mg by mouth 2 (two) times daily.    [provider]  colchicine (COLCRYS) 0.6 MG tablet Take 0.6 mg by mouth 2 (two) times daily as needed (gout).     [provider]  diclofenac sodium (VOLTAREN) 1 % GEL Apply 2 g topically 2 (two) times daily as needed (arthritic pain).    [provider]   HYDROcodone-acetaminophen (NORCO/VICODIN) 5-325 MG tablet Take 1-2 tablets by mouth every 4 (four) hours as needed for moderate pain or severe pain. 01/28/17   Chevis Pretty III, MD  insulin glargine (LANTUS) 100 UNIT/ML injection Inject 34 Units into the skin 2 (two) times daily.     [provider]  losartan (COZAAR) 100 MG tablet Take 100 mg by mouth daily 02/07/16   [provider]  ondansetron (ZOFRAN) 8 MG tablet Take 8 mg by mouth twice daily as needed for nausea 11/19/16   [provider]  oxyCODONE-acetaminophen (PERCOCET) 7.5-325 MG per tablet Take 0.5-1 tablets by mouth 2 (two) times daily as needed for severe pain.     [provider]  pantoprazole (PROTONIX) 40 MG tablet Take 40 mg by mouth 2 (two) times daily as needed (heartburn).     [provider]  simvastatin (ZOCOR) 20 MG tablet Take 20 mg by mouth every evening.    [provider]  sitaGLIPtin-metformin (JANUMET) 50-500 MG per tablet Take 1 tablet by mouth daily.    [provider]  tiZANidine (ZANAFLEX) 2 MG tablet Take 2 mg by mouth at bedtime as needed for muscle pain 11/05/16   [provider]  triamterene-hydrochlorothiazide (MAXZIDE-25) 37.5-25 MG tablet Take 1 tablet by mouth in the morning 11/05/16   [provider]  Allergies    Penicillins    Review of Systems   Review of Systems  Neurological:  Positive for weakness.    Physical Exam Updated Vital Signs BP (!) 158/64 (BP Location: Right Arm)   Pulse 86   Temp 98.7 F (37.1 C) (Oral)   Resp 18   SpO2 98%  Physical Exam Vitals and nursing note reviewed.  Constitutional:      General: She is not in acute distress.    Appearance: She is not toxic-appearing.  HENT:     Nose: Nose normal.     Mouth/Throat:     Mouth: Mucous membranes are dry.  Eyes:     Conjunctiva/sclera: Conjunctivae normal.  Cardiovascular:     Rate and Rhythm: Normal rate and regular rhythm.  Pulmonary:      Effort: Pulmonary effort is normal.  Abdominal:     General: Abdomen is flat. There is no distension.     Tenderness: There is no abdominal tenderness. There is no guarding or rebound.  Musculoskeletal:        General: Normal range of motion.     Comments: Compartments soft in bilateral lower extremities.  2+ DP pulses bilaterally.  Globally decreased strength, but equal bilaterally. .  Normal sensation.  Skin:    General: Skin is warm and dry.     Capillary Refill: Capillary refill takes less than 2 seconds.  Neurological:     Mental Status: She is alert and oriented to person, place, and time.  Psychiatric:        Mood and Affect: Mood normal.        Behavior: Behavior normal.     ED Results / Procedures / Treatments   Labs (all labs ordered are listed, but only abnormal results are displayed) Labs Reviewed  COMPREHENSIVE METABOLIC PANEL - Abnormal; Notable for the following components:      Result Value   Sodium 128 (*)    Chloride 95 (*)    CO2 20 (*)    Glucose, Bld 120 (*)    All other components within normal limits  URINALYSIS, ROUTINE W REFLEX MICROSCOPIC - Abnormal; Notable for the following components:   Color, Urine AMBER (*)    APPearance HAZY (*)    Hgb urine dipstick MODERATE (*)    Protein, ur >=300 (*)    Bacteria, UA RARE (*)    All other components within normal limits  TROPONIN I (HIGH SENSITIVITY) - Abnormal; Notable for the following components:   Troponin I (High Sensitivity) 20 (*)    All other components within normal limits  TROPONIN I (HIGH SENSITIVITY) - Abnormal; Notable for the following components:   Troponin I (High Sensitivity) 19 (*)    All other components within normal limits  LIPASE, BLOOD  CBC WITH DIFFERENTIAL/PLATELET  TSH    EKG None  Radiology CT ABDOMEN PELVIS W CONTRAST  Result Date: 04/23/2023 CLINICAL DATA:  Abdominal pain. EXAM: CT ABDOMEN AND PELVIS WITH CONTRAST TECHNIQUE: Multidetector CT imaging of the  abdomen and pelvis was performed using the standard protocol following bolus administration of intravenous contrast. RADIATION DOSE REDUCTION: This exam was performed according to the departmental dose-optimization program which includes automated exposure control, adjustment of the mA and/or kV according to patient size and/or use of iterative reconstruction technique. CONTRAST:  75mL OMNIPAQUE IOHEXOL 350 MG/ML SOLN COMPARISON:  CT abdomen pelvis dated 03/06/2023. FINDINGS: Lower chest: The visualized lung bases are clear. No intra-abdominal free air or free fluid. Hepatobiliary: Subcentimeter hypodense  focus in the left lobe of the liver is too small to characterize. No biliary dilatation. The gallbladder is unremarkable. Pancreas: Unremarkable. No pancreatic ductal dilatation or surrounding inflammatory changes. Spleen: Normal in size without focal abnormality. Adrenals/Urinary Tract: The right adrenal glands unremarkable. There is a 17 mm indeterminate left adrenal nodule. Small bilateral renal cysts and additional subcentimeter hypodense lesions which are too small to characterize. There is no hydronephrosis on either side. There is symmetric enhancement and excretion of contrast by both kidneys. The visualized ureters and urinary bladder appear unremarkable. Stomach/Bowel: There is no bowel obstruction or active inflammation. The appendix is normal. Vascular/Lymphatic: Mild aortoiliac atherosclerotic disease. The IVC is unremarkable. No portal venous gas. There is no adenopathy. Reproductive: The uterus is anteverted. Apparent faint nodular density in the endometrium (70/4) not evaluated on this CT. Further evaluation with ultrasound on a nonemergent/outpatient basis recommended. No adnexal masses. Other: None Musculoskeletal: Osteopenia with degenerative changes. No acute osseous pathology. IMPRESSION: 1. No acute intra-abdominal or pelvic pathology. 2. Apparent faint nodular density in the endometrium not  evaluated on this CT. Further evaluation with ultrasound on a nonemergent/outpatient basis recommended. 3.  Aortic Atherosclerosis (ICD10-I70.0). Electronically Signed   By: Elgie Collard M.D.   On: 04/23/2023 22:22   CT Lumbar Spine Wo Contrast  Result Date: 04/23/2023 CLINICAL DATA:  Metastatic disease evaluation, weakness EXAM: CT LUMBAR SPINE WITHOUT CONTRAST TECHNIQUE: Multidetector CT imaging of the lumbar spine was performed without intravenous contrast administration. Multiplanar CT image reconstructions were also generated. RADIATION DOSE REDUCTION: This exam was performed according to the departmental dose-optimization program which includes automated exposure control, adjustment of the mA and/or kV according to patient size and/or use of iterative reconstruction technique. COMPARISON:  03/06/2023 CT abdomen pelvis, no prior CT the lumbar spine available FINDINGS: Segmentation: 5 lumbar type vertebral bodies. Alignment: Dextrocurvature.  Trace retrolisthesis of L1 on L2. Vertebrae: No acute fracture or focal pathologic process. Endplate degenerative changes at L2-L3, L3-L4, and L4-L5, eccentric to the left. Paraspinal and other soft tissues: Please see same-day CT abdomen pelvis. Disc levels: T12-L1: Minimal disc bulge with left foraminal and extreme lateral protrusion. Mild facet arthropathy. No spinal canal stenosis or neural foraminal narrowing. L1-L2: Trace retrolisthesis and left eccentric disc bulge. Mild facet arthropathy. No spinal canal stenosis or neural foraminal narrowing. L2-L3: Disc height loss and disc osteophyte complex with moderate disc bulge. Moderate facet arthropathy. Narrowing of the lateral recesses. Mild to moderate spinal canal stenosis. Moderate bilateral neural foraminal narrowing. L3-L4: Mild disc bulge. Moderate facet arthropathy. Ligamentum flavum hypertrophy. Moderate to severe spinal canal stenosis. Effacement of the lateral recesses. Severe left and mild right  neural foraminal narrowing. L4-L5: Disc height loss and disc osteophyte complex with moderate disc bulge. Moderate facet arthropathy. Ligamentum flavum hypertrophy. Narrowing of the lateral recesses. Moderate spinal canal stenosis. Moderate left and mild right neural foraminal narrowing. L5-S1: Mild disc bulge with superimposed left subarticular disc protrusion, which effaces the left lateral recess. Moderate to severe facet arthropathy. Ligamentum flavum hypertrophy. No spinal canal stenosis. Mild left neural foraminal narrowing. IMPRESSION: 1. No acute fracture or traumatic listhesis. No evidence of metastatic disease in the lumbar spine. 2. L3-L4 moderate to severe spinal canal stenosis with severe left and mild right neural foraminal narrowing. Effacement of the lateral recesses at this level likely compresses the descending L4 nerve roots. 3. L4-L5 moderate spinal canal stenosis with moderate left and mild right neural foraminal narrowing. 4. L2-L3 mild to moderate spinal canal stenosis and moderate  bilateral neural foraminal narrowing. 5. L5-S1 mild left neural foraminal narrowing. Electronically Signed   By: Wiliam Ke M.D.   On: 04/23/2023 21:07   DG Chest 2 View  Result Date: 04/23/2023 CLINICAL DATA:  hx of lung masses, assess for interval growth EXAM: CHEST - 2 VIEW COMPARISON:  Chest radiograph from 12/01/2016 and CT angiography chest from 04/14/2023. FINDINGS: Redemonstration of a lobulated approximately 1.5 x 1.8 cm nodule in the left lung upper lobe. Exact comparison with prior CT scan is limited due to difference in technique. Consider CT scan examination in the future if accurate comparison is desired. There are nonspecific opacities overlying the right mid lung zone which are not well evaluated on the current examination but likely corresponds to ground-glass opacities seen in the middle lobe on prior chest CT scan. There is additional nonspecific opacity just above the left lateral  costophrenic angle, which corresponds to peripheral fibrosis/scarring on the prior CT scan. These can also be better evaluated with chest CT scan, if clinically desired. Bilateral lung fields are otherwise clear. No acute consolidation or lung collapse. Bilateral costophrenic angles are clear. Normal cardio-mediastinal silhouette. No acute osseous abnormalities. The soft tissues are within normal limits. IMPRESSION: *No acute cardiopulmonary abnormality. *Redemonstration of 1.5 x 1.8 cm nodule in the left lung upper lobe an additional nonspecific opacities in the right mid lung zone and left lower lung zone. Please see above for details. Electronically Signed   By: Jules Schick M.D.   On: 04/23/2023 14:49    Procedures Procedures    Medications Ordered in ED Medications  ondansetron (ZOFRAN) injection 4 mg (4 mg Intravenous Given 04/23/23 1542)  sodium chloride 0.9 % bolus 1,000 mL (0 mLs Intravenous Stopped 04/23/23 1734)  amLODipine (NORVASC) tablet 10 mg (10 mg Oral Given 04/23/23 1539)  losartan (COZAAR) tablet 100 mg (100 mg Oral Given 04/23/23 1539)  oxyCODONE-acetaminophen (PERCOCET/ROXICET) 5-325 MG per tablet 1 tablet (1 tablet Oral Given 04/23/23 1539)  iohexol (OMNIPAQUE) 350 MG/ML injection 75 mL (75 mLs Intravenous Contrast Given 04/23/23 1804)    ED Course/ Medical Decision Making/ A&P Clinical Course as of 04/23/23 2320  Tue Apr 23, 2023  1456 DG Chest 2 View Redemonstration of 1.5 x 1.8 cm nodule in the left lung upper lobe an additional nonspecific opacities in the right mid lung zone and left lower lung zone. Please see above for details.   [TY]  1458 CT scan abd per chart review 03/24/23 "IMPRESSION: 1. No acute findings within the abdomen or pelvis. 2. Mild wall thickening involving the distal transverse colon to the rectum is identified without surrounding inflammatory change. These loops appear decompressed. Findings likely favor underdistention versus mild  colitis. 3. Slightly irregular contour of the liver. Correlate for any clinical signs or symptoms of cirrhosis. 4.  Aortic Atherosclerosis (ICD10-I70.0) " [TY]  1500 Comprehensive metabolic panel(!) Appears to have hyponatremia. No AKI. No transaminate to suggest hepatobiliary disease.  [TY]  1501 Lipase: 20 Pancreatitis unlikely.  [TY]  1501 Troponin I (High Sensitivity)(!): 20 Minor elevation, no prior to compare to; ECG without ST elevations or ischemic changes.  [TY]  1506 Per chart review saw oncology 03/25/23 and per their note :"89 year old African-American female with left axillary mass and increase in size of bilateral subsolid lung nodules the largest in the right middle lobe measuring 2.7 cm with internal solid component of 8 mm.  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 No acute fracture or traumatic listhesis. No evidence of metastatic disease in the lumbar spine. 2. L3-L4 moderate to severe spinal canal stenosis with severe left and mild right neural foraminal narrowing. Effacement of the lateral recesses at this level likely compresses the descending L4 nerve roots. 3. L4-L5 moderate spinal canal stenosis with moderate left and mild right neural foraminal narrowing. 4. L2-L3 mild to moderate spinal canal stenosis and moderate bilateral neural foraminal narrowing. 5. L5-S1 mild left neural foraminal narrowing.   [TY]  2228 CT ABDOMEN PELVIS W CONTRAST IMPRESSION: 1. No acute intra-abdominal or pelvic pathology. 2. Apparent faint nodular density in the endometrium not evaluated on this CT. Further evaluation with ultrasound on a nonemergent/outpatient basis recommended. 3.  Aortic Atherosclerosis (ICD10-I70.0).   [TY]  2314 Patient with reassuring workup.  Received IV  fluids.  Suspect hyponatremia secondary to hypovolemia.  Tolerating p.o. here in the emergency department.  CT scans reassuring.  Discussed finding of nodular density in endometrium and further outpatient follow up. Stable for discharge.  [TY]    Clinical Course User Index [TY] Coral Spikes, DO                                 Medical Decision Making Is a 89 year old female with complex past medical history being worked up outpatient for chart review for lung cancer.  She is hypertensive, afebrile nontachycardic maintaining oxygen saturation on room air.  Physical exam with dry mucous membranes and poor skin turgor.  With suggestions of hypovolemia which would coincide with her decreased p.o. intake that she reports for the past week.  Labs as noted in ED course with hyponatremia.  Given vague complaint of abdominal pain in the setting of cancer will get CT abdomen.  Will add on lumbar spine to evaluate for possible metastatic process as she does complain of some low back pain with seemingly radicular type pain to right leg.  Spoke with daughter at length on phone as patient has been worked up in the past with negative DVT scans on right leg and has Ortho appointment upcoming as well.  Patient has not had any of her morning medications per daughter.  Will give antihypertensives and pain medications.  See ED course for reevaluation/final MDM.  Amount and/or Complexity of Data Reviewed Labs:  Decision-making details documented in ED Course. Radiology: ordered. Decision-making details documented in ED Course.  Risk Prescription drug management.         Final Clinical Impression(s) / ED Diagnoses Final diagnoses:  Chronic right-sided low back pain without sciatica  Hyponatremia    Rx / DC Orders ED Discharge Orders     None         Coral Spikes, DO 04/23/23 2320

## 2023-04-23 NOTE — ED Triage Notes (Signed)
Pt BIB [facility] with reports of generalized weakness, dry throat, decreased appetite, and "gas" x 10 days.

## 2023-04-23 NOTE — Discharge Instructions (Addendum)
Please follow-up with your primary doctor for repeat labs as your sodium level was low.  Please increase your fluid intake for the next day or 2.  Please return he felt fevers, chills, chest pain, shortness of breath or any new or worsening symptoms that are concerning to you.  You had an abnormal spot in your uterus on CT scan.  Radiology recommending getting an ultrasound in the near future to better characterize.

## 2023-04-23 NOTE — ED Provider Triage Note (Signed)
Emergency Medicine Provider Triage Evaluation Note  MATTISEN WYNDER , a 89 y.o. female undergoing workup for lung masses who was evaluated in triage.  Pt complains of generalized weakness for several weeks that is worsening.  Review of Systems  Positive: weakness Negative: Fevers, chills  Physical Exam  BP (!) 168/78 (BP Location: Right Arm)   Pulse (!) 103   Temp 98.1 F (36.7 C) (Oral)   Resp 18   SpO2 99%  Gen:   Awake, no distress   Resp:  Normal effort  MSK:   Moves extremities without difficulty  Other:    Medical Decision Making  Medically screening exam initiated at 10:08 AM.  Appropriate orders placed.  ESSICA JAISWAL was informed that the remainder of the evaluation will be completed by another provider, this initial triage assessment does not replace that evaluation, and the importance of remaining in the ED until their evaluation is complete.   Rondel Baton, MD 04/23/23 1009

## 2023-04-29 ENCOUNTER — Ambulatory Visit: Payer: 59 | Admitting: Internal Medicine

## 2023-04-29 ENCOUNTER — Other Ambulatory Visit: Payer: 59

## 2023-04-29 DIAGNOSIS — R110 Nausea: Secondary | ICD-10-CM | POA: Diagnosis not present

## 2023-04-30 ENCOUNTER — Encounter (HOSPITAL_COMMUNITY): Payer: Self-pay | Admitting: Emergency Medicine

## 2023-04-30 NOTE — Progress Notes (Signed)
Anesthesia Chart Review: Rhonda Conrad  Case: [id number] Date/Time: 05/06/23 0730   Procedure: ROBOTIC ASSISTED NAVIGATIONAL BRONCHOSCOPY (Bilateral)   Anesthesia type: General   Pre-op diagnosis: BILATERAL PULMONARY NODULES   [facility] / [facility]   Surgeons: Leslye Peer, MD       DISCUSSION: Patient is an 89 year old female scheduled for the above procedure.   History includes former smoker, HTN, hypercholesterolemia, DM2, pulmonary nodules. No significant CAD by 2015 cath (10-15% mid CX).   S/p deep left axillary lymph node biopsy on 01/28/17 after findings of incidental RUL pulmonary nodule on C-spine CT with chest CT showing left axillary mass, left breast nodules, multiple pulmonary nodules and  thyroid nodules (did not meet criteria for biopsy or follow-up). 12/17/16 left axillary core needle biopsy showed benign lymph nodal tissues. 12/18/16 tissue flow cytometry showed no monoclonal B cell population or abnormal T cell phenotype identified. Pathology from 01/30/23 left axillary lymph node biopsy showed reactive lymphoid hyperplasia.   Since then she had ED visit on 03/12/23 for HTN and headache and work-up including a chest CTA which showed interval increase in the size of the left axillary mass with adjacent surgical clips.  This measures 6.5 x 5.7 cm (compared to 2018 which measured 5.5 x 3.8 cm noted to be concerning for nodal metastasis or lymphoproliferative disorder).  She also had interval increase in the size of lateral subsolid nodules largest in the right middle lobe measuring 2.7 cm with internal solid component 8 mm (which on previous exam was groundglass in appearance).  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  She also had a solid nodule within the left upper lobe that has increased in size concerning for primary bronchogenic carcinoma. Head CT also showed an enlarging 2.1 x 1.4 cm mass in the right CP angle cistern which  was present which had increased in size from prior imaging in 2015 and 2018, and felt to likely represent a meningioma.  The patient and her daughter did not want to pursue any intervention given her age.    She was referred to oncology and was evaluated by Dr. Arbutus Ped who recommended a  PET scan and "referral to pulmonary medicine for bronchoscopy and biopsy of the lungs. If positive for malignancy, would recommend molecular studies. The patient has had the axillary lesion biopsied with several core samples in the past which have been negative for malignancy."  She had evaluation with pulmonologist Dr. Delton Coombes on 04/19/23. RML and LUL increased in size and suspicious for slow growing lung cancer. Bronchoscopy recommended for tissue diagnosis. Continued monitoring of left axillary mass recommended for now given prior negative biopsy. He advised to hold ASA 2 days prior to procedure.  ED visit 04/23/23 for multiple concerns including weakness. Note reviewed. Treated with IVF, pain medications, and antihypertensives. Imaging summarized below.   She had minimal CAD by LHC in 2015. Last visit with cardiologist Rinaldo Cloud, MD was on 06/30/18.   Anesthesia team to evaluate on the day of surgery.    ADDENDUM 05/01/23 2:06 PM:  Daughter called and spoke with RN staff. Patient evaluated at [facility] by Liliane Shi, DO and had a H. Pylori breath test. Reportedly results positive and has been prescribed tetracycline, metronidazole, and Pepto-Bismol. She has reached out to Dr. Kavin Leech office to see if he feels it will interfere with surgery plans. If case remains as scheduled and she begins these medications before surgery, then advised to  hold morning doses until after surgery since she is a first case.   VS:  Wt Readings from Last 3 Encounters:  04/19/23 80.4 kg  03/25/23 81 kg  03/12/23 89.2 kg   BP Readings from Last 3 Encounters:  04/23/23 (!) 176/66  04/19/23 (!) 155/85   04/14/23 (!) 190/89   Pulse Readings from Last 3 Encounters:  04/23/23 96  04/19/23 (!) 52  04/14/23 79     PROVIDERS: Noberto Retort, MD is PCP  Si Gaul, MD is HEM-ONC. Evaluation on 03/25/23 by Heilingoetter, Cassandra, PA-C.   LABS: Most recent lab results in [facility] include: Lab Results  Component Value Date   WBC 9.9 04/23/2023   HGB 12.9 04/23/2023   HCT 38.5 04/23/2023   PLT PLATELET CLUMPS NOTED ON SMEAR, UNABLE TO ESTIMATE 04/23/2023   GLUCOSE 120 (H) 04/23/2023   ALT 11 04/23/2023   AST 16 04/23/2023   NA 128 (L) 04/23/2023   K 3.7 04/23/2023   CL 95 (L) 04/23/2023   CREATININE 0.76 04/23/2023   BUN 8 04/23/2023   CO2 20 (L) 04/23/2023   TSH 1.578 04/23/2023  PLT count 167K on 04/13/23.   IMAGES: CT Abd/pelvis 04/23/23: IMPRESSION: 1. No acute intra-abdominal or pelvic pathology. 2. Apparent faint nodular density in the endometrium not evaluated on this CT. Further evaluation with ultrasound on a nonemergent/outpatient basis recommended. 3.  Aortic Atherosclerosis (ICD10-I70.0).   CT L-spine 04/23/23: IMPRESSION: 1. No acute fracture or traumatic listhesis. No evidence of metastatic disease in the lumbar spine. 2. L3-L4 moderate to severe spinal canal stenosis with severe left and mild right neural foraminal narrowing. Effacement of the lateral recesses at this level likely compresses the descending L4 nerve roots. 3. L4-L5 moderate spinal canal stenosis with moderate left and mild right neural foraminal narrowing. 4. L2-L3 mild to moderate spinal canal stenosis and moderate bilateral neural foraminal narrowing. 5. L5-S1 mild left neural foraminal narrowing.  CXR 04/23/23: IMPRESSION: *No acute cardiopulmonary abnormality. *Redemonstration of 1.5 x 1.8 cm nodule in the left lung upper lobe an additional nonspecific opacities in the right mid lung zone and left lower lung zone. Please see above for details.   CTA Chest  04/14/23: MPRESSION: 1. No evidence of pulmonary embolism or other acute process. 2. Bilateral solid and subsolid pulmonary nodules measuring up to 3.0 cm, concerning for malignancy. 3. Left axillary mass measuring 8.2 x 4.6 x 6.8 cm, increased in size from the prior exam, concerning for primary malignancy versus metastatic disease. 4. 1.9 cm hypodense nodule in the left lobe of the thyroid gland. Nonemergent thyroid ultrasound is recommended if clinically warranted given patient's age. 5. Aortic atherosclerosis.   PET Scan 04/05/23: IMPRESSION: - 2.0 cm solid left upper lobe nodule, compatible with primary bronchogenic carcinoma. - 2.9 cm subsolid right middle lobe nodule, suggesting additional primary bronchogenic carcinoma, likely semi invasive adenocarcinoma. - 6.1 cm short axis left axillary nodal mass, chronic but mildly progressive, previously yielding benign biopsy results. This is unlikely to be related to the patient's suspected multifocal primary bronchogenic carcinoma but likely warrants repeat biopsy given continued progression. - No findings suspicious for metastatic disease.   EKG: 04/23/23:  Sinus tachycardia at 101 bpm Moderate voltage criteria for LVH, may be normal variant ( R in aVL , Cornell product ) Septal infarct , age undetermined Abnormal ECG When compared with ECG of 13-Apr-2023 20:34, PREVIOUS ECG IS PRESENT Confirmed by Estanislado Pandy [phone]) on 04/23/2023 3:07:08 PM   CV: Cardiac cath 06/15/13: FINDINGS:  1. LV showed good LV systolic function. 2. Left main was patent. 3. LAD was patent. 4. Diagonal 1 to diagonal 3 were very, very small which were patent. 5. Left circumflex had 10% to 15% mid stenosis. 6. OM-1 was very, very small.  OM-2 was moderate sized, which was patent.  OM-3 was small which was patent. 7. RCA was patent.  PDA and PLV branches were patent.      Past Medical History:  Diagnosis Date   Anxiety    Arthritis    Diabetes  mellitus without complication (HCC)    Headache    High cholesterol    Hypertension     Past Surgical History:  Procedure Laterality Date   AXILLARY LYMPH NODE BIOPSY Left 01/28/2017   Procedure: LEFT AXILLARY LYMPH NODE BIOPSY;  Surgeon: Griselda Miner, MD;  Location: [facility];  Service: General;  Laterality: Left;  RNFA   CARDIAC CATHETERIZATION     LEFT HEART CATHETERIZATION WITH CORONARY ANGIOGRAM N/A 06/15/2013   Procedure: LEFT HEART CATHETERIZATION WITH CORONARY ANGIOGRAM;  Surgeon: Robynn Pane, MD;  Location: [facility] CATH LAB;  Service: Cardiovascular;  Laterality: N/A;   vaginal polyp removal      MEDICATIONS: No current facility-administered medications for this encounter.    gabapentin (NEURONTIN) 100 MG capsule   rosuvastatin (CRESTOR) 5 MG tablet   acetaminophen (TYLENOL) 500 MG tablet   ALPRAZolam (XANAX) 0.5 MG tablet   amLODipine (NORVASC) 10 MG tablet   aspirin EC 81 MG EC tablet   benzonatate (TESSALON) 100 MG capsule   busPIRone (BUSPAR) 7.5 MG tablet   colchicine (COLCRYS) 0.6 MG tablet   diclofenac sodium (VOLTAREN) 1 % GEL   HYDROcodone-acetaminophen (NORCO/VICODIN) 5-325 MG tablet   insulin glargine (LANTUS) 100 UNIT/ML injection   losartan (COZAAR) 100 MG tablet   ondansetron (ZOFRAN) 4 MG tablet   oxyCODONE-acetaminophen (PERCOCET) 7.5-325 MG per tablet   pantoprazole (PROTONIX) 40 MG tablet   simvastatin (ZOCOR) 20 MG tablet   sitaGLIPtin-metformin (JANUMET) 50-500 MG per tablet   tiZANidine (ZANAFLEX) 2 MG tablet   triamterene-hydrochlorothiazide (MAXZIDE-25) 37.5-25 MG tablet    Shonna Chock, PA-C Surgical Short Stay/Anesthesiology [facility] Phone [phone] [facility] Phone [phone] 04/30/2023 6:56 PM

## 2023-04-30 NOTE — Anesthesia Preprocedure Evaluation (Addendum)
Anesthesia Evaluation  Patient identified by MRN, date of birth, ID band Patient awake    Reviewed: Allergy & Precautions, NPO status , Patient's Chart, lab work & pertinent test results, reviewed documented beta blocker date and time   Airway Mallampati: II  TM Distance: >3 FB     Dental  (+) Dental Advisory Given, Edentulous Upper, Missing,    Pulmonary former smoker Bilateral pulmonary nodules   Pulmonary exam normal breath sounds clear to auscultation       Cardiovascular hypertension, Pt. on medications Normal cardiovascular exam Rhythm:Regular Rate:Normal  EKG 04/24/23 ST, LVH, septal infarct   Neuro/Psych  Headaches  Anxiety        GI/Hepatic Neg liver ROS,GERD  Medicated,,  Endo/Other  diabetes, Well Controlled, Type 2, Oral Hypoglycemic Agents  Obesity HLD  Renal/GU Renal InsufficiencyRenal diseaseHyponatremia  negative genitourinary   Musculoskeletal  (+) Arthritis , Osteoarthritis,    Abdominal  (+) + obese  Peds  Hematology negative hematology ROS (+)   Anesthesia Other Findings   Reproductive/Obstetrics                             Anesthesia Physical Anesthesia Plan  ASA: 3  Anesthesia Plan: General   Post-op Pain Management: Minimal or no pain anticipated   Induction: Intravenous  PONV Risk Score and Plan: 3 and Treatment may vary due to age or medical condition and TIVA  Airway Management Planned: Oral ETT  Additional Equipment: None  Intra-op Plan:   Post-operative Plan: Extubation in OR  Informed Consent: I have reviewed the patients History and Physical, chart, labs and discussed the procedure including the risks, benefits and alternatives for the proposed anesthesia with the patient or authorized representative who has indicated his/her understanding and acceptance.     Dental advisory given  Plan Discussed with: Anesthesiologist and CRNA  Anesthesia  Plan Comments: (PAT note written 04/30/2023 by Shonna Chock, PA-C.  REcheck Na+ this am  )       Anesthesia Quick Evaluation

## 2023-04-30 NOTE — Progress Notes (Signed)
SDW call  Patient's daughter, Marchelle Folks, was given pre-op instructions over the phone. She verbalized understanding of instructions provided.     PCP - Dr. Johny Blamer Cardiologist - Dr. Sharyn Lull Pulmonary:    PPM/ICD - denies Device Orders - na Rep Notified - na   Chest x-ray - 04/23/2023 EKG -  04/24/2023 Stress Test - ECHO -  Cardiac Cath - 2015  Sleep Study/sleep apnea/CPAP: denies  Type II diabetic Fasting Blood sugar range: unsure How often check sugars: unsure Janumet, instructed to hold day of surgery Lantus, instructed to use 17 units the night before and the morning of surgery; which is 50% of her regular dose. Daughter states she does not think her mother will use the insulin that early in the morning   Blood Thinner Instructions: denies Aspirin Instructions: stop 2 days prior to surgery, last dose to be 05/03/2023   ERAS Protcol - NPO   COVID TEST- na    Anesthesia review: Yes.  HTN, DM, high cholesterol, Hx heart cath   Your procedure is scheduled on Monday May 06, 2023  Report to [facility] Main Entrance "A" at  0530  A.M., then check in with the Admitting office.  Call this number if you have problems the morning of surgery:  [phone]   If you have any questions prior to your surgery date call [phone]: Open Monday-Friday 8am-4pm If you experience any cold or flu symptoms such as cough, fever, chills, shortness of breath, etc. between now and your scheduled surgery, please notify us at the above number    Remember:  Do not eat or drink after midnight the night before your surgery  Take these medicines the morning of surgery with A SIP OF WATER:  Amlodipine, Protonix  As needed: Tylenol, buspar, tessalon, gabapentin, colchicine, norco, percocet, zofran, zanaflex  As of today, STOP taking any Aspirin (unless otherwise instructed by your surgeon) Aleve, Naproxen, Ibuprofen, Motrin, Advil, Goody's, BC's, all herbal medications, fish oil, and  all vitamins.  This includes your Voltaren gel

## 2023-05-01 ENCOUNTER — Telehealth: Payer: Self-pay | Admitting: Emergency Medicine

## 2023-05-01 NOTE — Telephone Encounter (Signed)
Yes, she should start the antibiotics as prescribed by GI. Thanks.

## 2023-05-01 NOTE — Telephone Encounter (Addendum)
Pt mother H. Pylori was performed on Monday & test the has came back positive which is stomach disease. They want her to start 4 different 2 antibiotics (tetracycline 500mg  1 tablet 4 x a day for 14 days & metronidazole 250mg  1 tablet 4x a day, as well as Pepto bismol). Pt is sch for on Bronch on Monday at 7:30 am. Daughter question is should she start the medicine due to her bronch that is sch. Pls Advise, Pt daugter will be at work she will be able to answer the call but if she misses it PLEASE leave a detailed VOICEMAIL.

## 2023-05-01 NOTE — Telephone Encounter (Signed)
Rerouting to DOD please advise, pt daughter not listed on dpr but advised pt if she can also get her mother on the phone we could talk with verbal

## 2023-05-01 NOTE — Telephone Encounter (Signed)
Pt daughter not listed in dpr therefore we will not be able to contact her back. [name] please advise, so I can call pt.

## 2023-05-06 ENCOUNTER — Ambulatory Visit (HOSPITAL_COMMUNITY)
Admission: RE | Admit: 2023-05-06 | Discharge: 2023-05-06 | Disposition: A | Discharge status: Home / Self Care | Payer: 59 | Attending: Emergency Medicine | Admitting: Emergency Medicine

## 2023-05-06 ENCOUNTER — Encounter (HOSPITAL_COMMUNITY): Payer: Self-pay | Admitting: Emergency Medicine

## 2023-05-06 ENCOUNTER — Other Ambulatory Visit: Payer: Self-pay

## 2023-05-06 ENCOUNTER — Ambulatory Visit: Payer: 59 | Admitting: Internal Medicine

## 2023-05-06 ENCOUNTER — Ambulatory Visit (HOSPITAL_COMMUNITY): Payer: 59 | Admitting: Vascular Surgery

## 2023-05-06 ENCOUNTER — Ambulatory Visit (HOSPITAL_COMMUNITY): Payer: 59

## 2023-05-06 ENCOUNTER — Encounter (HOSPITAL_COMMUNITY)
Admission: RE | Disposition: A | Discharge status: Home / Self Care | Payer: Self-pay | Source: Home / Self Care | Attending: Emergency Medicine

## 2023-05-06 DIAGNOSIS — Z9889 Other specified postprocedural states: Secondary | ICD-10-CM | POA: Insufficient documentation

## 2023-05-06 DIAGNOSIS — I129 Hypertensive chronic kidney disease with stage 1 through stage 4 chronic kidney disease, or unspecified chronic kidney disease: Secondary | ICD-10-CM | POA: Diagnosis not present

## 2023-05-06 DIAGNOSIS — R911 Solitary pulmonary nodule: Secondary | ICD-10-CM | POA: Diagnosis not present

## 2023-05-06 DIAGNOSIS — Z7984 Long term (current) use of oral hypoglycemic drugs: Secondary | ICD-10-CM | POA: Insufficient documentation

## 2023-05-06 DIAGNOSIS — E1122 Type 2 diabetes mellitus with diabetic chronic kidney disease: Secondary | ICD-10-CM | POA: Insufficient documentation

## 2023-05-06 DIAGNOSIS — K219 Gastro-esophageal reflux disease without esophagitis: Secondary | ICD-10-CM | POA: Insufficient documentation

## 2023-05-06 DIAGNOSIS — F419 Anxiety disorder, unspecified: Secondary | ICD-10-CM | POA: Diagnosis not present

## 2023-05-06 DIAGNOSIS — C342 Malignant neoplasm of middle lobe, bronchus or lung: Secondary | ICD-10-CM | POA: Diagnosis not present

## 2023-05-06 DIAGNOSIS — R918 Other nonspecific abnormal finding of lung field: Secondary | ICD-10-CM | POA: Insufficient documentation

## 2023-05-06 DIAGNOSIS — C3490 Malignant neoplasm of unspecified part of unspecified bronchus or lung: Secondary | ICD-10-CM | POA: Diagnosis not present

## 2023-05-06 DIAGNOSIS — Z87891 Personal history of nicotine dependence: Secondary | ICD-10-CM | POA: Diagnosis not present

## 2023-05-06 DIAGNOSIS — Z794 Long term (current) use of insulin: Secondary | ICD-10-CM | POA: Diagnosis not present

## 2023-05-06 DIAGNOSIS — Z48813 Encounter for surgical aftercare following surgery on the respiratory system: Secondary | ICD-10-CM | POA: Diagnosis not present

## 2023-05-06 DIAGNOSIS — E7800 Pure hypercholesterolemia, unspecified: Secondary | ICD-10-CM | POA: Insufficient documentation

## 2023-05-06 DIAGNOSIS — N189 Chronic kidney disease, unspecified: Secondary | ICD-10-CM | POA: Insufficient documentation

## 2023-05-06 DIAGNOSIS — J984 Other disorders of lung: Secondary | ICD-10-CM | POA: Diagnosis not present

## 2023-05-06 DIAGNOSIS — J939 Pneumothorax, unspecified: Secondary | ICD-10-CM | POA: Diagnosis not present

## 2023-05-06 HISTORY — PX: HEMOSTASIS CONTROL: SHX6838

## 2023-05-06 HISTORY — PX: BRONCHIAL BIOPSY: SHX5109

## 2023-05-06 HISTORY — PX: BRONCHIAL NEEDLE ASPIRATION BIOPSY: SHX5106

## 2023-05-06 HISTORY — PX: BRONCHIAL BRUSHINGS: SHX5108

## 2023-05-06 LAB — BASIC METABOLIC PANEL
Anion gap: 12 (ref 5–15)
BUN: 33 mg/dL — ABNORMAL HIGH (ref 8–23)
CO2: 19 mmol/L — ABNORMAL LOW (ref 22–32)
Calcium: 9.3 mg/dL (ref 8.9–10.3)
Chloride: 101 mmol/L (ref 98–111)
Creatinine, Ser: 1.53 mg/dL — ABNORMAL HIGH (ref 0.44–1.00)
GFR, Estimated: 32 mL/min — ABNORMAL LOW (ref >60)
Glucose, Bld: 88 mg/dL (ref 70–99)
Potassium: 3.5 mmol/L (ref 3.5–5.1)
Sodium: 132 mmol/L — ABNORMAL LOW (ref 135–145)

## 2023-05-06 LAB — GLUCOSE, CAPILLARY
Glucose-Capillary: 89 mg/dL (ref 70–99)
Glucose-Capillary: 88 mg/dL (ref 70–99)

## 2023-05-06 SURGERY — BRONCHOSCOPY, WITH BIOPSY USING ELECTROMAGNETIC NAVIGATION
Anesthesia: General | Laterality: Bilateral

## 2023-05-06 MED ORDER — PROPOFOL 500 MG/50ML IV EMUL
INTRAVENOUS | Status: DC | PRN
  Administered 2023-05-06: 125 ug/kg/min via INTRAVENOUS

## 2023-05-06 MED ORDER — CHLORHEXIDINE GLUCONATE 0.12 % MT SOLN
OROMUCOSAL | Status: AC
  Administered 2023-05-06: 15 mL
  Filled 2023-05-06: qty 15

## 2023-05-06 MED ORDER — PHENYLEPHRINE 80 MCG/ML (10ML) SYRINGE FOR IV PUSH (FOR BLOOD PRESSURE SUPPORT)
PREFILLED_SYRINGE | INTRAVENOUS | Status: DC | PRN
  Administered 2023-05-06 (×2): 160 ug via INTRAVENOUS

## 2023-05-06 MED ORDER — SUGAMMADEX SODIUM 200 MG/2ML IV SOLN
INTRAVENOUS | Status: DC | PRN
  Administered 2023-05-06: 200 mg via INTRAVENOUS

## 2023-05-06 MED ORDER — FENTANYL CITRATE (PF) 100 MCG/2ML IJ SOLN
25.0000 ug | INTRAMUSCULAR | Status: DC | PRN
Start: 2023-05-06 — End: 2023-05-06

## 2023-05-06 MED ORDER — OXYCODONE HCL 5 MG PO TABS: 5.0000 mg | ORAL_TABLET | Freq: Once | ORAL | Status: DC | PRN

## 2023-05-06 MED ORDER — ONDANSETRON HCL 4 MG/2ML IJ SOLN
INTRAMUSCULAR | Status: DC | PRN
  Administered 2023-05-06: 4 mg via INTRAVENOUS

## 2023-05-06 MED ORDER — CHLORHEXIDINE GLUCONATE 0.12 % MT SOLN: 15.0000 mL | Freq: Once | OROMUCOSAL | Status: DC

## 2023-05-06 MED ORDER — LACTATED RINGERS IV SOLN: INTRAVENOUS | Status: DC

## 2023-05-06 MED ORDER — ROCURONIUM BROMIDE 10 MG/ML (PF) SYRINGE
PREFILLED_SYRINGE | INTRAVENOUS | Status: DC | PRN
  Administered 2023-05-06: 60 mg via INTRAVENOUS

## 2023-05-06 MED ORDER — INSULIN ASPART 100 UNIT/ML IJ SOLN
0.0000 [IU] | INTRAMUSCULAR | Status: DC | PRN
Start: 2023-05-06 — End: 2023-05-06

## 2023-05-06 MED ORDER — FENTANYL CITRATE (PF) 100 MCG/2ML IJ SOLN
INTRAMUSCULAR | Status: AC
  Filled 2023-05-06: qty 2

## 2023-05-06 MED ORDER — FENTANYL CITRATE (PF) 250 MCG/5ML IJ SOLN
INTRAMUSCULAR | Status: DC | PRN
  Administered 2023-05-06: 50 ug via INTRAVENOUS

## 2023-05-06 MED ORDER — PROPOFOL 10 MG/ML IV BOLUS
INTRAVENOUS | Status: DC | PRN
  Administered 2023-05-06: 150 mg via INTRAVENOUS

## 2023-05-06 MED ORDER — OXYCODONE HCL 5 MG/5ML PO SOLN
5.0000 mg | Freq: Once | ORAL | Status: DC | PRN
Start: 2023-05-06 — End: 2023-05-06

## 2023-05-06 MED ORDER — PHENYLEPHRINE HCL-NACL 20-0.9 MG/250ML-% IV SOLN
INTRAVENOUS | Status: DC | PRN
  Administered 2023-05-06: 50 ug/min via INTRAVENOUS

## 2023-05-06 MED ORDER — LIDOCAINE 2% (20 MG/ML) 5 ML SYRINGE
INTRAMUSCULAR | Status: DC | PRN
  Administered 2023-05-06: 60 mg via INTRAVENOUS

## 2023-05-06 MED ORDER — ONDANSETRON HCL 4 MG/2ML IJ SOLN: 4.0000 mg | Freq: Once | INTRAMUSCULAR | Status: DC | PRN

## 2023-05-06 NOTE — Interval H&P Note (Signed)
History and Physical Interval Note:  05/06/2023 7:29 AM  Rhonda Conrad  has presented today for surgery, with the diagnosis of BILATERAL PULMONARY NODULES.  The various methods of treatment have been discussed with the patient and family. After consideration of risks, benefits and other options for treatment, the patient has consented to  Procedure(s): ROBOTIC ASSISTED NAVIGATIONAL BRONCHOSCOPY (Bilateral) as a surgical intervention.  The patient's history has been reviewed, patient examined, no change in status, stable for surgery.  I have reviewed the patient's chart. Await her sodium and BMP this am. Assuming stable then no barriers to proceeding.  Questions were answered to the patient's satisfaction.     Leslye Peer

## 2023-05-06 NOTE — Op Note (Signed)
Video Bronchoscopy with Robotic Assisted Bronchoscopic Navigation   Date of Operation: 05/06/2023   Pre-op Diagnosis: Bilateral pulmonary nodules  Post-op Diagnosis: Same  Surgeon: Levy Pupa  Assistants: None  Anesthesia: General endotracheal anesthesia  Operation: Flexible video fiberoptic bronchoscopy with robotic assistance and biopsies.  Estimated Blood Loss: Minimal  Complications: None  Indications and History: Rhonda Conrad is a 89 y.o. female with history of former tobacco use, history of a left axillary mass.  Recommendation made to achieve a tissue diagnosis via robotic assisted navigational bronchoscopy.  She had repeat imaging and a PET scan 04/05/2023 that showed hypermetabolic left upper lobe nodule, right middle lobe nodules and some enlargement of the left axillary mass.. The risks, benefits, complications, treatment options and expected outcomes were discussed with the patient.  The possibilities of pneumothorax, pneumonia, reaction to medication, pulmonary aspiration, perforation of a viscus, bleeding, failure to diagnose a condition and creating a complication requiring transfusion or operation were discussed with the patient who freely signed the consent.    Description of Procedure: The patient was seen in the Preoperative Area, was examined and was deemed appropriate to proceed.  The patient was taken to [facility] endoscopy room 3, identified as [name] and the procedure verified as Flexible Video Fiberoptic Bronchoscopy.  A Time Out was held and the above information confirmed.   Prior to the date of the procedure a high-resolution CT scan of the chest was performed. Utilizing ION software program a virtual tracheobronchial tree was generated to allow the creation of distinct navigation pathways to the patient's parenchymal abnormalities. After being taken to the operating room general anesthesia was initiated and the patient  was orally intubated. The video  fiberoptic bronchoscope was introduced via the endotracheal tube and a general inspection was performed which showed normal right and left lung anatomy. Aspiration of the bilateral mainstems was completed to remove any remaining secretions. Robotic catheter inserted into patient's endotracheal tube.   Target #1 right middle lobe nodule: The distinct navigation pathways prepared prior to this procedure were then utilized to navigate to patient's lesion identified on CT scan. The robotic catheter was secured into place and the vision probe was withdrawn.  Lesion location was approximated using fluoroscopy.  Local registration and targeting was performed using Cios three-dimensional imaging. Under fluoroscopic guidance transbronchial needle brushings, transbronchial needle biopsies, and transbronchial forceps biopsies were performed to be sent for cytology and pathology.   Target #2 left upper lobe nodule: The distinct navigation pathways prepared prior to this procedure were then utilized to navigate to patient's lesion identified on CT scan. The robotic catheter was secured into place and the vision probe was withdrawn.  Lesion location was approximated using fluoroscopy.  Local registration and targeting was performed using Cios three-dimensional imaging.  Needle in lesion was established using Cios-spin. Under fluoroscopic guidance transbronchial needle brushings, transbronchial needle biopsies, and transbronchial forceps biopsies were performed to be sent for cytology and pathology.   Guardant360 blood work was performed for molecular markers.  At the end of the procedure a general airway inspection was performed and there was no evidence of active bleeding. The bronchoscope was removed.  The patient tolerated the procedure well. There was no significant blood loss and there were no obvious complications. A post-procedural chest x-ray is pending.  Samples Target #1: 1. Transbronchial needle brushings  from right middle lobe nodule 2. Transbronchial Wang needle biopsies from right middle lobe nodule 3. Transbronchial forceps biopsies from right middle lobe nodule  Samples Target #2: 1. Transbronchial needle brushings from left upper lobe nodule 2. Transbronchial Wang needle biopsies from left upper lobe nodule 3. Transbronchial forceps biopsies from left upper lobe nodule   Additional sample: 1.  Guardant360 blood work  Plans:  The patient wil either discharge from the PACU or admitted for observation depending on her postprocedural status, stability and wakefulness.  We will review the cytology and pathology results when available.  She will follow-up with Saralyn Pilar, oncology and Dr. Delton Coombes.  Levy Pupa, MD, PhD 05/06/2023, 9:15 AM [facility] [phone] or if no answer before 7:00PM call [phone] For any issues after 7:00PM please call eLink [phone]

## 2023-05-06 NOTE — Transfer of Care (Signed)
Immediate Anesthesia Transfer of Care Note  Patient: Rhonda Conrad  Procedure(s) Performed: ROBOTIC ASSISTED NAVIGATIONAL BRONCHOSCOPY (Bilateral) BRONCHIAL BRUSHINGS BRONCHIAL NEEDLE ASPIRATION BIOPSIES BRONCHIAL BIOPSIES HEMOSTASIS CONTROL  Patient Location: PACU  Anesthesia Type:General  Level of Consciousness: awake, alert , oriented, and patient cooperative  Airway & Oxygen Therapy: Patient Spontanous Breathing  Post-op Assessment: Report given to RN and Post -op Vital signs reviewed and stable  Post vital signs: Reviewed and stable  Last Vitals:  Vitals Value Taken Time  BP 123/56 05/06/23 0922  Temp 36.7 C 05/06/23 0922  Pulse 85 05/06/23 0926  Resp 16 05/06/23 0926  SpO2 93 % 05/06/23 0926  Vitals shown include unfiled device data.  Last Pain:  Vitals:   05/06/23 0922  TempSrc:   PainSc: 0-No pain         Complications: No notable events documented.

## 2023-05-06 NOTE — Progress Notes (Signed)
Per MD Byrum, ok to discharge patient home based off CXR results.

## 2023-05-06 NOTE — Anesthesia Procedure Notes (Signed)
Procedure Name: Intubation Date/Time: 05/06/2023 7:58 AM  Performed by: Gus Puma, CRNAPre-anesthesia Checklist: Patient identified, Emergency Drugs available, Suction available and Patient being monitored Patient Re-evaluated:Patient Re-evaluated prior to induction Oxygen Delivery Method: Circle System Utilized Preoxygenation: Pre-oxygenation with 100% oxygen Induction Type: IV induction Ventilation: Mask ventilation without difficulty Laryngoscope Size: Mac and 3 Grade View: Grade I Tube type: Oral Tube size: 8.5 mm Number of attempts: 1 Airway Equipment and Method: Stylet and Oral airway Placement Confirmation: ETT inserted through vocal cords under direct vision, positive ETCO2 and breath sounds checked- equal and bilateral Secured at: 23 cm Tube secured with: Tape Dental Injury: Teeth and Oropharynx as per pre-operative assessment

## 2023-05-06 NOTE — Discharge Instructions (Addendum)
Flexible Bronchoscopy, Care After This sheet gives you information about how to care for yourself after your test. Your doctor may also give you more specific instructions. If you have problems or questions, contact your doctor. Follow these instructions at home: Eating and drinking When your numbness is gone and your cough and gag reflexes have come back, you may: Eat only soft foods. Slowly drink liquids. The day after the test, go back to your normal diet. Driving Do not drive for 24 hours if you were given a medicine to help you relax (sedative). Do not drive or use heavy machinery while taking prescription pain medicine. General instructions  Take over-the-counter and prescription medicines only as told by your doctor. Return to your normal activities as told. Ask what activities are safe for you. Do not use any products that have nicotine or tobacco in them. This includes cigarettes and e-cigarettes. If you need help quitting, ask your doctor. Keep all follow-up visits as told by your doctor. This is important. It is very important if you had a tissue sample (biopsy) taken. Get help right away if: You have shortness of breath that gets worse. You get light-headed. You feel like you are going to pass out (faint). You have chest pain. You cough up: More than a little blood. More blood than before. Summary Do not eat or drink anything (not even water) for 2 hours after your test, or until your numbing medicine wears off. Do not use cigarettes. Do not use e-cigarettes. Get help right away if you have chest pain.  Please call our office for any questions or concerns.  [phone].  This information is not intended to replace advice given to you by your health care provider. Make sure you discuss any questions you have with your health care provider. Document Released: 03/18/2009 Document Revised: 05/03/2017 Document Reviewed: 06/08/2016 Elsevier Patient Education  2020 [employer]  

## 2023-05-06 NOTE — Anesthesia Postprocedure Evaluation (Signed)
Anesthesia Post Note  Patient: ADRIANNY SLISZ  Procedure(s) Performed: ROBOTIC ASSISTED NAVIGATIONAL BRONCHOSCOPY (Bilateral) BRONCHIAL BRUSHINGS BRONCHIAL NEEDLE ASPIRATION BIOPSIES BRONCHIAL BIOPSIES HEMOSTASIS CONTROL     Patient location during evaluation: PACU Anesthesia Type: General Level of consciousness: awake and alert and oriented Pain management: pain level controlled Vital Signs Assessment: post-procedure vital signs reviewed and stable Respiratory status: spontaneous breathing, nonlabored ventilation and respiratory function stable Cardiovascular status: blood pressure returned to baseline and stable Postop Assessment: no apparent nausea or vomiting Anesthetic complications: no   No notable events documented.  Last Vitals:  Vitals:   05/06/23 0930 05/06/23 0945  BP: 131/72 (!) 142/66  Pulse: 80 79  Resp: (!) 21 19  Temp:    SpO2: 93% 93%    Last Pain:  Vitals:   05/06/23 0922  TempSrc:   PainSc: 0-No pain                 Kiela Shisler A.

## 2023-05-07 NOTE — Telephone Encounter (Signed)
Pt received bronch, closing encounter

## 2023-05-08 LAB — CYTOLOGY - NON PAP

## 2023-05-09 ENCOUNTER — Encounter (HOSPITAL_COMMUNITY): Payer: Self-pay | Admitting: Emergency Medicine

## 2023-05-10 NOTE — Progress Notes (Unsigned)
[facility] OFFICE PROGRESS NOTE  Noberto Retort, MD [address] [place] [address]  DIAGNOSIS: Non-small cell lung cancer, adenocarcinoma. She presented with a left axillary mass and increase in size (previously biopsied) and increasing bilateral subsolid lung nodules the largest in the right middle lobe measuring 2.7 cm with internal solid component of 8 mm. 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 However, only the RML biopsy showed NSCLC, Adenocarcinoma   PRIOR THERAPY: None  CURRENT THERAPY: Referral to see radiation oncology   INTERVAL HISTORY: Rhonda Conrad 89 y.o. female returns clinic the today for a follow-up visit accompanied by her caregiver. Her daughter was available by phone.  She establish care in the clinic on 03/25/23 after she presented to the emergency room on 03/12/2023 for hypertension and headache.  Part of her workup in the ER included CTA.  This was compared to a CT performed in 2018. The scan showed interval increase in the size of the left axillary mass with adjacent surgical clips.  This measures 6.5 x 5.7 cm (compared to 2018 which measured 5.5 x 3.8 cm noted to be concerning for nodal metastasis or lymphoproliferative disorder).  She also had interval increase in the size of lateral subsolid nodules largest in the right middle lobe measuring 2.7 cm with internal solid component 8 mm (which on previous exam was groundglass in appearance).  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  She also had a solid nodule within the left upper lobe that has increased in size concerning for primary bronchogenic carcinoma.   Of note, she also had a lymph node biopsy of the left axillary region in 2018 and all of the samples showed reactive lymphoid hyperplasia. She has had 4 core  samples all negative for malignancy.   The patient subsequently had a PET scan and saw Dr. Delton Coombes who arrange for bronchoscopy and biopsy on 05/06/2023 and the final pathology ([id number]) showed adenocarcinoma.   Overall, she is feeling fair today. She sees GI for ongoing gastrointestinal discomfort for which she is on Metamucil and Benefiber.  She also has nausea about 35 minutes prior to eating.  She takes Pepto-Bismol as needed but has not been taking her prescription for Zofran.  Denies any fever, chills, or night sweats.  Per chart review it appears that the patient has lost a lot of weight since last being seen.  She does not weigh herself at home.  She did report feeling cannot if in her throat after having her bronchoscopy but that is getting better.  She denies any changes in her breathing since she was last seen.  Denies any chest pain or hemoptysis. She denies headaches or vision changes. Of note, the patient stopped taking all of her medications this past week including her antihypertensives despite her daughter filling up her pill box.   She is here today for evaluation to discuss the next steps in her care.  Of note, the ER head CT showed enlarging 2.1 x 1.4 cm mass in the right CP angle cistern which was present on prior imaging in 2015 and 2018 but has increased in size compared to prior.  This most likely represents meningioma.  The patient and her daughter are not interested in any intervention given her age.    MEDICAL HISTORY: Past Medical History:  Diagnosis Date  Anxiety    Arthritis    Diabetes mellitus without complication (HCC)    Headache    High cholesterol    Hypertension     ALLERGIES:  is allergic to penicillins.  MEDICATIONS:  Current Outpatient Medications  Medication Sig Dispense Refill   acetaminophen (TYLENOL) 500 MG tablet Take 500 mg by mouth every 8 (eight) hours as needed for mild pain or headache.      ALPRAZolam (XANAX) 0.5 MG tablet Take 0.5 mg  by mouth 3 (three) times daily as needed for anxiety.  (Patient not taking: Reported on 04/30/2023)  3   amLODipine (NORVASC) 10 MG tablet Take 10 mg by mouth daily.     aspirin EC 81 MG EC tablet Take 1 tablet (81 mg total) by mouth daily.     busPIRone (BUSPAR) 7.5 MG tablet Take 10 mg by mouth 2 (two) times daily as needed (anxiety).     colchicine (COLCRYS) 0.6 MG tablet Take 0.6 mg by mouth 2 (two) times daily as needed (gout).      diclofenac sodium (VOLTAREN) 1 % GEL Apply 2 g topically 2 (two) times daily as needed (arthritic pain).     gabapentin (NEURONTIN) 100 MG capsule Take 100 mg by mouth at bedtime.     HYDROcodone-acetaminophen (NORCO/VICODIN) 5-325 MG tablet Take 1-2 tablets by mouth every 4 (four) hours as needed for moderate pain or severe pain. 15 tablet 0   insulin glargine (LANTUS) 100 UNIT/ML injection Inject 34 Units into the skin 2 (two) times daily.      losartan (COZAAR) 100 MG tablet Take 100 mg by mouth daily  2   metroNIDAZOLE (FLAGYL) 250 MG tablet Take 250 mg by mouth 4 (four) times daily.     ondansetron (ZOFRAN) 4 MG tablet Take 1 tablet (4 mg total) by mouth every 6 (six) hours. 12 tablet 0   oxyCODONE-acetaminophen (PERCOCET) 7.5-325 MG per tablet Take 0.5-1 tablets by mouth 2 (two) times daily as needed for severe pain.      pantoprazole (PROTONIX) 40 MG tablet Take 40 mg by mouth 2 (two) times daily as needed (heartburn).      rosuvastatin (CRESTOR) 5 MG tablet Take 5 mg by mouth at bedtime.     sitaGLIPtin-metformin (JANUMET) 50-500 MG per tablet Take 1 tablet by mouth daily.     tetracycline (SUMYCIN) 500 MG capsule Take 500 mg by mouth 4 (four) times daily.     tiZANidine (ZANAFLEX) 2 MG tablet Take 2 mg by mouth at bedtime as needed for muscle pain  3   triamterene-hydrochlorothiazide (MAXZIDE-25) 37.5-25 MG tablet Take 1 tablet by mouth in the morning  0   No current facility-administered medications for this visit.    SURGICAL HISTORY:  Past  Surgical History:  Procedure Laterality Date   AXILLARY LYMPH NODE BIOPSY Left 01/28/2017   Procedure: LEFT AXILLARY LYMPH NODE BIOPSY;  Surgeon: Griselda Miner, MD;  Location: [facility] OR;  Service: General;  Laterality: Left;  RNFA   BRONCHIAL BIOPSY  05/06/2023   Procedure: BRONCHIAL BIOPSIES;  Surgeon: Leslye Peer, MD;  Location: [facility] ENDOSCOPY;  Service: Pulmonary;;   BRONCHIAL BRUSHINGS  05/06/2023   Procedure: BRONCHIAL BRUSHINGS;  Surgeon: Leslye Peer, MD;  Location: [facility] ENDOSCOPY;  Service: Pulmonary;;   BRONCHIAL NEEDLE ASPIRATION BIOPSY  05/06/2023   Procedure: BRONCHIAL NEEDLE ASPIRATION BIOPSIES;  Surgeon: Leslye Peer, MD;  Location: [facility] ENDOSCOPY;  Service: Pulmonary;;   CARDIAC CATHETERIZATION     HEMOSTASIS CONTROL  05/06/2023   Procedure: HEMOSTASIS CONTROL;  Surgeon: Leslye Peer, MD;  Location: [facility] ENDOSCOPY;  Service: Pulmonary;;   LEFT HEART CATHETERIZATION WITH CORONARY ANGIOGRAM N/A 06/15/2013   Procedure: LEFT HEART CATHETERIZATION WITH CORONARY ANGIOGRAM;  Surgeon: Robynn Pane, MD;  Location: [facility] CATH LAB;  Service: Cardiovascular;  Laterality: N/A;   vaginal polyp removal      REVIEW OF SYSTEMS:   Constitutional: Positive for fatigue, appetite change, and weight loss. Negative for chills and fever.  HENT: Negative for mouth sores and nosebleeds.  Eyes: Negative for eye problems and icterus.  Respiratory: Negative for cough, hemoptysis, shortness of breath and wheezing.   Cardiovascular: Negative for chest pain and leg swelling.  Gastrointestinal: Positive for occasional lower abdominal discomfort, constipation, nausea.  Negative for vomiting.  Genitourinary: Negative for bladder incontinence, difficulty urinating, dysuria, frequency and hematuria.   Musculoskeletal: Positive for low back pain. Negative for gait problem, neck pain and neck stiffness.  Skin: Negative for itching and rash.  Neurological: Negative for dizziness, extremity weakness, gait problem,  headaches, light-headedness and seizures.  Hematological: Negative for adenopathy. Does not bruise/bleed easily.  Psychiatric/Behavioral: Negative for confusion, depression and sleep disturbance. The patient is not nervous/anxious.     PHYSICAL EXAMINATION:  There were no vitals taken for this visit.  ECOG PERFORMANCE STATUS: 2  Physical Exam  Constitutional: Oriented to person, place, and time and well-developed, well-nourished, and in no distress.  HENT:  Head: Normocephalic and atraumatic.  Mouth/Throat: Oropharynx is clear and moist. No oropharyngeal exudate.  Eyes: Conjunctivae are normal. Right eye exhibits no discharge. Left eye exhibits no discharge. No scleral icterus.  Neck: Normal range of motion. Neck supple.  Cardiovascular: Normal rate, regular rhythm, normal heart sounds and intact distal pulses.   Pulmonary/Chest: Effort normal and breath sounds normal. No respiratory distress. No wheezes. No rales.  Abdominal: Soft. Bowel sounds are normal. Exhibits no distension and no mass. There is no tenderness.  Musculoskeletal: Normal range of motion. Exhibits no edema.  Lymphadenopathy:    No cervical adenopathy.  Neurological: Alert and oriented to person, place, and time. Exhibits muscle wasting.  She was examined in the wheelchair.  Skin: Skin is warm and dry. No rash noted. Not diaphoretic. No erythema. No pallor.  Psychiatric: Mood, memory and judgment normal.  Vitals reviewed.  LABORATORY DATA: Lab Results  Component Value Date   WBC 9.9 04/23/2023   HGB 12.9 04/23/2023   HCT 38.5 04/23/2023   MCV 85.7 04/23/2023   PLT PLATELET CLUMPS NOTED ON SMEAR, UNABLE TO ESTIMATE 04/23/2023      Chemistry      Component Value Date/Time   NA 132 (L) 05/06/2023 0640   K 3.5 05/06/2023 0640   CL 101 05/06/2023 0640   CO2 19 (L) 05/06/2023 0640   BUN 33 (H) 05/06/2023 0640   CREATININE 1.53 (H) 05/06/2023 0640   CREATININE 1.32 (H) 03/25/2023 1000      Component Value  Date/Time   CALCIUM 9.3 05/06/2023 0640   ALKPHOS 52 04/23/2023 1008   AST 16 04/23/2023 1008   AST 12 (L) 03/25/2023 1000   ALT 11 04/23/2023 1008   ALT 8 03/25/2023 1000   BILITOT 0.8 04/23/2023 1008   BILITOT 0.6 03/25/2023 1000       RADIOGRAPHIC STUDIES:  DG Chest Port 1 View  Result Date: 05/06/2023 CLINICAL DATA:  Status post bronchoscopy with biopsy. Evaluate for pneumothorax. EXAM: PORTABLE CHEST 1 VIEW COMPARISON:  Chest CT 04/14/2023 FINDINGS: Hazy densities  in left upper lung most likely associated with the patient's known pulmonary nodule and could represent post biopsy changes. Negative for pneumothorax. Again noted are surgical clips in the left axilla. Right lung is clear. Heart and mediastinum are within normal limits. Atherosclerotic calcifications at the aortic arch. IMPRESSION: 1. Negative for pneumothorax. 2. Hazy densities in the left upper lung could be related to post biopsy changes and known nodule in this area. Electronically Signed   By: Richarda Overlie M.D.   On: 05/06/2023 10:02   DG C-ARM BRONCHOSCOPY  Result Date: 05/06/2023 C-ARM BRONCHOSCOPY: Fluoroscopy was utilized by the requesting physician.  No radiographic interpretation.   CT ABDOMEN PELVIS W CONTRAST  Result Date: 04/23/2023 CLINICAL DATA:  Abdominal pain. EXAM: CT ABDOMEN AND PELVIS WITH CONTRAST TECHNIQUE: Multidetector CT imaging of the abdomen and pelvis was performed using the standard protocol following bolus administration of intravenous contrast. RADIATION DOSE REDUCTION: This exam was performed according to the departmental dose-optimization program which includes automated exposure control, adjustment of the mA and/or kV according to patient size and/or use of iterative reconstruction technique. CONTRAST:  75mL OMNIPAQUE IOHEXOL 350 MG/ML SOLN COMPARISON:  CT abdomen pelvis dated 03/06/2023. FINDINGS: Lower chest: The visualized lung bases are clear. No intra-abdominal free air or free fluid.  Hepatobiliary: Subcentimeter hypodense focus in the left lobe of the liver is too small to characterize. No biliary dilatation. The gallbladder is unremarkable. Pancreas: Unremarkable. No pancreatic ductal dilatation or surrounding inflammatory changes. Spleen: Normal in size without focal abnormality. Adrenals/Urinary Tract: The right adrenal glands unremarkable. There is a 17 mm indeterminate left adrenal nodule. Small bilateral renal cysts and additional subcentimeter hypodense lesions which are too small to characterize. There is no hydronephrosis on either side. There is symmetric enhancement and excretion of contrast by both kidneys. The visualized ureters and urinary bladder appear unremarkable. Stomach/Bowel: There is no bowel obstruction or active inflammation. The appendix is normal. Vascular/Lymphatic: Mild aortoiliac atherosclerotic disease. The IVC is unremarkable. No portal venous gas. There is no adenopathy. Reproductive: The uterus is anteverted. Apparent faint nodular density in the endometrium (70/4) not evaluated on this CT. Further evaluation with ultrasound on a nonemergent/outpatient basis recommended. No adnexal masses. Other: None Musculoskeletal: Osteopenia with degenerative changes. No acute osseous pathology. IMPRESSION: 1. No acute intra-abdominal or pelvic pathology. 2. Apparent faint nodular density in the endometrium not evaluated on this CT. Further evaluation with ultrasound on a nonemergent/outpatient basis recommended. 3.  Aortic Atherosclerosis (ICD10-I70.0). Electronically Signed   By: Elgie Collard M.D.   On: 04/23/2023 22:22   CT Lumbar Spine Wo Contrast  Result Date: 04/23/2023 CLINICAL DATA:  Metastatic disease evaluation, weakness EXAM: CT LUMBAR SPINE WITHOUT CONTRAST TECHNIQUE: Multidetector CT imaging of the lumbar spine was performed without intravenous contrast administration. Multiplanar CT image reconstructions were also generated. RADIATION DOSE REDUCTION:  This exam was performed according to the departmental dose-optimization program which includes automated exposure control, adjustment of the mA and/or kV according to patient size and/or use of iterative reconstruction technique. COMPARISON:  03/06/2023 CT abdomen pelvis, no prior CT the lumbar spine available FINDINGS: Segmentation: 5 lumbar type vertebral bodies. Alignment: Dextrocurvature.  Trace retrolisthesis of L1 on L2. Vertebrae: No acute fracture or focal pathologic process. Endplate degenerative changes at L2-L3, L3-L4, and L4-L5, eccentric to the left. Paraspinal and other soft tissues: Please see same-day CT abdomen pelvis. Disc levels: T12-L1: Minimal disc bulge with left foraminal and extreme lateral protrusion. Mild facet arthropathy. No spinal canal stenosis or neural foraminal  narrowing. L1-L2: Trace retrolisthesis and left eccentric disc bulge. Mild facet arthropathy. No spinal canal stenosis or neural foraminal narrowing. L2-L3: Disc height loss and disc osteophyte complex with moderate disc bulge. Moderate facet arthropathy. Narrowing of the lateral recesses. Mild to moderate spinal canal stenosis. Moderate bilateral neural foraminal narrowing. L3-L4: Mild disc bulge. Moderate facet arthropathy. Ligamentum flavum hypertrophy. Moderate to severe spinal canal stenosis. Effacement of the lateral recesses. Severe left and mild right neural foraminal narrowing. L4-L5: Disc height loss and disc osteophyte complex with moderate disc bulge. Moderate facet arthropathy. Ligamentum flavum hypertrophy. Narrowing of the lateral recesses. Moderate spinal canal stenosis. Moderate left and mild right neural foraminal narrowing. L5-S1: Mild disc bulge with superimposed left subarticular disc protrusion, which effaces the left lateral recess. Moderate to severe facet arthropathy. Ligamentum flavum hypertrophy. No spinal canal stenosis. Mild left neural foraminal narrowing. IMPRESSION: 1. No acute fracture or  traumatic listhesis. No evidence of metastatic disease in the lumbar spine. 2. L3-L4 moderate to severe spinal canal stenosis with severe left and mild right neural foraminal narrowing. Effacement of the lateral recesses at this level likely compresses the descending L4 nerve roots. 3. L4-L5 moderate spinal canal stenosis with moderate left and mild right neural foraminal narrowing. 4. L2-L3 mild to moderate spinal canal stenosis and moderate bilateral neural foraminal narrowing. 5. L5-S1 mild left neural foraminal narrowing. Electronically Signed   By: Wiliam Ke M.D.   On: 04/23/2023 21:07   DG Chest 2 View  Result Date: 04/23/2023 CLINICAL DATA:  hx of lung masses, assess for interval growth EXAM: CHEST - 2 VIEW COMPARISON:  Chest radiograph from 12/01/2016 and CT angiography chest from 04/14/2023. FINDINGS: Redemonstration of a lobulated approximately 1.5 x 1.8 cm nodule in the left lung upper lobe. Exact comparison with prior CT scan is limited due to difference in technique. Consider CT scan examination in the future if accurate comparison is desired. There are nonspecific opacities overlying the right mid lung zone which are not well evaluated on the current examination but likely corresponds to ground-glass opacities seen in the middle lobe on prior chest CT scan. There is additional nonspecific opacity just above the left lateral costophrenic angle, which corresponds to peripheral fibrosis/scarring on the prior CT scan. These can also be better evaluated with chest CT scan, if clinically desired. Bilateral lung fields are otherwise clear. No acute consolidation or lung collapse. Bilateral costophrenic angles are clear. Normal cardio-mediastinal silhouette. No acute osseous abnormalities. The soft tissues are within normal limits. IMPRESSION: *No acute cardiopulmonary abnormality. *Redemonstration of 1.5 x 1.8 cm nodule in the left lung upper lobe an additional nonspecific opacities in the right mid  lung zone and left lower lung zone. Please see above for details. Electronically Signed   By: Jules Schick M.D.   On: 04/23/2023 14:49   LE VENOUS  Result Date: 04/14/2023  Lower Venous DVT Study Patient Name:  BRYLEN KOUPAL  Date of Exam:   04/14/2023 Medical Rec #: [id number]           Accession #:    [id number] Date of Birth: 09/09/1931            Patient Gender: F Patient Age:   39 years Exam Location:  [facility] Procedure:      VAS [place] LOWER EXTREMITY VENOUS (DVT) Referring Phys: Fayrene Helper --------------------------------------------------------------------------------  Indications: Swelling, and elevated D-dimer (> 20.00).  Comparison Study: Previous exam on 09/19/2015 was negative for DVT Performing Technologist: Ernestene Mention RVT, RDMS  Examination Guidelines: A complete evaluation includes B-mode imaging, spectral Doppler, color Doppler, and power Doppler as needed of all accessible portions of each vessel. Bilateral testing is considered an integral part of a complete examination. Limited examinations for reoccurring indications may be performed as noted. The reflux portion of the exam is performed with the patient in reverse Trendelenburg.  +---------+---------------+---------+-----------+----------+--------------+ RIGHT    CompressibilityPhasicitySpontaneityPropertiesThrombus Aging +---------+---------------+---------+-----------+----------+--------------+ CFV      Full           Yes      Yes                                 +---------+---------------+---------+-----------+----------+--------------+ SFJ      Full                                                        +---------+---------------+---------+-----------+----------+--------------+ FV Prox  Full           Yes      Yes                                 +---------+---------------+---------+-----------+----------+--------------+ FV Mid   Full           Yes      Yes                                  +---------+---------------+---------+-----------+----------+--------------+ FV DistalFull           Yes      Yes                                 +---------+---------------+---------+-----------+----------+--------------+ PFV      Full                                                        +---------+---------------+---------+-----------+----------+--------------+ POP      Full           Yes      Yes                                 +---------+---------------+---------+-----------+----------+--------------+ PTV      Full                                                        +---------+---------------+---------+-----------+----------+--------------+ PERO     Full                                                        +---------+---------------+---------+-----------+----------+--------------+   +---------+---------------+---------+-----------+----------+-------------------+ LEFT  CompressibilityPhasicitySpontaneityPropertiesThrombus Aging      +---------+---------------+---------+-----------+----------+-------------------+ CFV      Full           Yes      Yes                                      +---------+---------------+---------+-----------+----------+-------------------+ SFJ      Full                                                             +---------+---------------+---------+-----------+----------+-------------------+ FV Prox  Full           Yes      Yes                                      +---------+---------------+---------+-----------+----------+-------------------+ FV Mid   Full           Yes      Yes                                      +---------+---------------+---------+-----------+----------+-------------------+ FV DistalFull           Yes      Yes                                      +---------+---------------+---------+-----------+----------+-------------------+ PFV      Full                                                              +---------+---------------+---------+-----------+----------+-------------------+ POP      Full           Yes      Yes                                      +---------+---------------+---------+-----------+----------+-------------------+ PTV                                                   Not well visualized +---------+---------------+---------+-----------+----------+-------------------+ PERO     Full                                                             +---------+---------------+---------+-----------+----------+-------------------+     Summary: BILATERAL: - No evidence of deep vein thrombosis seen in the lower extremities, bilaterally. -No evidence of popliteal cyst, bilaterally.   *See table(s) above for measurements and  observations. Electronically signed by Carolynn Sayers on 04/14/2023 at 4:08:30 PM.    Final    CT Angio Chest PE W and/or Wo Contrast  Result Date: 04/14/2023 CLINICAL DATA:  Pulmonary embolism suspected, low to intermediate probability, positive D-dimer. EXAM: CT ANGIOGRAPHY CHEST WITH CONTRAST TECHNIQUE: Multidetector CT imaging of the chest was performed using the standard protocol during bolus administration of intravenous contrast. Multiplanar CT image reconstructions and MIPs were obtained to evaluate the vascular anatomy. RADIATION DOSE REDUCTION: This exam was performed according to the departmental dose-optimization program which includes automated exposure control, adjustment of the mA and/or kV according to patient size and/or use of iterative reconstruction technique. CONTRAST:  75mL OMNIPAQUE IOHEXOL 350 MG/ML SOLN COMPARISON:  03/12/2023, 04/05/2023. FINDINGS: Cardiovascular: The heart is normal in size and there is a trace pericardial effusion. There is atherosclerotic calcification of the aorta without evidence of aneurysm. The pulmonary trunk is normal in caliber. No pulmonary embolus is seen. Mediastinum/Nodes: No mediastinal,  hilar, or axillary lymphadenopathy. Hypodense nodule is noted in the left lobe of the thyroid gland measuring 1.9 cm. The trachea and esophagus are within normal limits. Lungs/Pleura: There is a 1.8 cm nodule in the left upper lobe, axial image 30. Scattered subsolid nodules are present bilaterally, the largest measuring 3.1 x 1.1 cm in the right middle lobe, axial image 42. No effusion or pneumothorax is seen. Upper Abdomen: No acute abnormality. Musculoskeletal: There is partial visualization of a mass in the left axilla measuring 8.2 x 4.6 x 6.8 cm. Surgical clips are noted in the left axilla. Degenerative changes are present in the thoracic spine. No acute or suspicious osseous abnormality. Review of the MIP images confirms the above findings. IMPRESSION: 1. No evidence of pulmonary embolism or other acute process. 2. Bilateral solid and subsolid pulmonary nodules measuring up to 3.0 cm, concerning for malignancy. 3. Left axillary mass measuring 8.2 x 4.6 x 6.8 cm, increased in size from the prior exam, concerning for primary malignancy versus metastatic disease. 4. 1.9 cm hypodense nodule in the left lobe of the thyroid gland. Nonemergent thyroid ultrasound is recommended if clinically warranted given patient's age. 5. Aortic atherosclerosis. Electronically Signed   By: Thornell Sartorius M.D.   On: 04/14/2023 01:32     ASSESSMENT/PLAN:  This a very pleasant 89 year old African-American female NSCLC, adenocarcinoma. She presented with axillary mass (biopsied x4 in in 2018) and increase in size of bilateral subsolid lung nodules the largest in the right middle lobe measuring 2.7 cm with internal solid component of 8 mm. 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   The patient was seen with Dr. Arbutus Ped today.  Dr. Arbutus Ped personally and independently reviewed the scan and  discussed results with the patient today.  The scan showed a left upper lobe lesion is hypermetabolic compatible with primary bronchogenic carcinoma despite the biopsy being negative.  The right middle lobe nodule also suggest additional bronchogenic carcinoma.  The radiologist did not feel that the chronic left axillary mass is related to the multifocal primary bronchogenic carcinoma.  Given the patient's advanced age, she likely be a poor candidate for any type of systemic therapy.  Therefore Dr. Arbutus Ped recommends SBRT to the right and left lung lesions.  I have placed a referral to radiation oncology for treatment and surveillance imaging.  We will not arrange for any follow-up appointments at this time  but would be happy to see the patient on an as-needed basis.  She will continue to follow with GI regarding her ongoing GI symptoms.   The patient's blood pressure is elevated today.  She stopped taking her medications including her antihypertensives for the last week for unknown reasons.  The patient was advised to resume taking her medications upon returning home and to monitor her blood pressure closely at home. She denies any headaches or symptoms of hypertension today.   Of note, she had CT of the head performed in the ER showing meningoma but given her age, they are not interested in intervention.   The patient was advised to call immediately if she has any concerning symptoms in the interval. The patient voices understanding of current disease status and treatment options and is in agreement with the current care plan. All questions were answered. The patient knows to call the clinic with any problems, questions or concerns. We can certainly see the patient much sooner if necessary   No orders of the defined types were placed in this encounter.    Rhonda Conrad L Zackry Deines, PA-C 05/10/23  ADDENDUM: Hematology/Oncology Attending: I had a face-to-face encounter with the patient  today.  I reviewed her records, lab, scan as well as recent pathology report.  The patient has bilateral pulmonary nodules involving the left upper lobe as well as the right middle lobe that were concerning for multifocal adenocarcinoma.  She underwent bronchoscopy with biopsy of the right middle lobe that was consistent with non-small cell lung cancer, adenocarcinoma.  Biopsy of the left upper lobe was negative for malignancy but still highly suspicious for primary bronchogenic carcinoma based on the size as well as the hypermetabolic activity on the PET scan. The patient also had large left axillary mass that was biopsied several several times in the past and it was negative for malignancy. I discussed with the patient and her daughter who was available by phone her treatment options.  The patient is not a good candidate for any systemic therapy for the multifocal disease because of her age and comorbidities.  I recommended for her to see radiation oncology for consideration of curative SBRT to the bilateral pulmonary nodules followed by close monitoring and observation by radiation oncology. We will see the patient on as-needed basis at this point. The patient and her daughter agreed to the current plan. She was advised to call if she has any concerning symptoms. The total time spent in the appointment was 30 minutes. Disclaimer: This note was dictated with voice recognition software. Similar sounding words can inadvertently be transcribed and may be missed upon review. Lajuana Matte, MD

## 2023-05-13 ENCOUNTER — Other Ambulatory Visit: Payer: Self-pay | Admitting: Physician Assistant

## 2023-05-13 ENCOUNTER — Inpatient Hospital Stay: Payer: 59 | Attending: Physician Assistant | Admitting: Physician Assistant

## 2023-05-13 ENCOUNTER — Inpatient Hospital Stay: Payer: 59

## 2023-05-13 VITALS — BP 167/101 | HR 101 | Temp 98.1°F | Resp 17 | Wt 167.1 lb

## 2023-05-13 DIAGNOSIS — C342 Malignant neoplasm of middle lobe, bronchus or lung: Secondary | ICD-10-CM | POA: Insufficient documentation

## 2023-05-13 DIAGNOSIS — C569 Malignant neoplasm of unspecified ovary: Secondary | ICD-10-CM

## 2023-05-13 DIAGNOSIS — R110 Nausea: Secondary | ICD-10-CM | POA: Diagnosis not present

## 2023-05-13 DIAGNOSIS — C801 Malignant (primary) neoplasm, unspecified: Secondary | ICD-10-CM

## 2023-05-13 DIAGNOSIS — C3490 Malignant neoplasm of unspecified part of unspecified bronchus or lung: Secondary | ICD-10-CM

## 2023-05-13 LAB — CBC WITH DIFFERENTIAL (CANCER CENTER ONLY)
Abs Immature Granulocytes: 0.06 10*3/uL (ref 0.00–0.07)
Basophils Absolute: 0 10*3/uL (ref 0.0–0.1)
Basophils Relative: 0 %
Eosinophils Absolute: 0 10*3/uL (ref 0.0–0.5)
Eosinophils Relative: 0 %
HCT: 40.9 % (ref 36.0–46.0)
Hemoglobin: 13.6 g/dL (ref 12.0–15.0)
Immature Granulocytes: 1 %
Lymphocytes Relative: 17 %
Lymphs Abs: 1.8 10*3/uL (ref 0.7–4.0)
MCH: 29.6 pg (ref 26.0–34.0)
MCHC: 33.3 g/dL (ref 30.0–36.0)
MCV: 89.1 fL (ref 80.0–100.0)
Monocytes Absolute: 0.7 10*3/uL (ref 0.1–1.0)
Monocytes Relative: 6 %
Neutro Abs: 8.4 10*3/uL — ABNORMAL HIGH (ref 1.7–7.7)
Neutrophils Relative %: 76 %
Platelet Count: 109 10*3/uL — ABNORMAL LOW (ref 150–400)
RBC: 4.59 MIL/uL (ref 3.87–5.11)
RDW: 13.2 % (ref 11.5–15.5)
WBC Count: 11.1 10*3/uL — ABNORMAL HIGH (ref 4.0–10.5)
nRBC: 0 % (ref 0.0–0.2)

## 2023-05-13 LAB — CMP (CANCER CENTER ONLY)
ALT: 34 U/L (ref 0–44)
AST: 29 U/L (ref 15–41)
Albumin: 3.1 g/dL — ABNORMAL LOW (ref 3.5–5.0)
Alkaline Phosphatase: 61 U/L (ref 38–126)
Anion gap: 7 (ref 5–15)
BUN: 32 mg/dL — ABNORMAL HIGH (ref 8–23)
CO2: 28 mmol/L (ref 22–32)
Calcium: 9.5 mg/dL (ref 8.9–10.3)
Chloride: 103 mmol/L (ref 98–111)
Creatinine: 1.09 mg/dL — ABNORMAL HIGH (ref 0.44–1.00)
GFR, Estimated: 49 mL/min — ABNORMAL LOW (ref >60)
Glucose, Bld: 218 mg/dL — ABNORMAL HIGH (ref 70–99)
Potassium: 4.4 mmol/L (ref 3.5–5.1)
Sodium: 138 mmol/L (ref 135–145)
Total Bilirubin: 0.9 mg/dL (ref <1.2)
Total Protein: 6.1 g/dL — ABNORMAL LOW (ref 6.5–8.1)

## 2023-05-14 ENCOUNTER — Ambulatory Visit: Payer: 59 | Admitting: Acute Care

## 2023-05-14 DIAGNOSIS — M1711 Unilateral primary osteoarthritis, right knee: Secondary | ICD-10-CM | POA: Diagnosis not present

## 2023-05-15 ENCOUNTER — Encounter: Payer: Self-pay | Admitting: Acute Care

## 2023-05-15 ENCOUNTER — Ambulatory Visit: Payer: 59 | Admitting: Acute Care

## 2023-05-15 VITALS — BP 136/85 | HR 115 | Temp 98.5°F | Resp 22

## 2023-05-15 DIAGNOSIS — R0609 Other forms of dyspnea: Secondary | ICD-10-CM

## 2023-05-15 DIAGNOSIS — C801 Malignant (primary) neoplasm, unspecified: Secondary | ICD-10-CM

## 2023-05-15 DIAGNOSIS — Z87891 Personal history of nicotine dependence: Secondary | ICD-10-CM | POA: Diagnosis not present

## 2023-05-15 DIAGNOSIS — I10 Essential (primary) hypertension: Secondary | ICD-10-CM | POA: Diagnosis not present

## 2023-05-15 DIAGNOSIS — Z9889 Other specified postprocedural states: Secondary | ICD-10-CM

## 2023-05-15 DIAGNOSIS — R222 Localized swelling, mass and lump, trunk: Secondary | ICD-10-CM

## 2023-05-15 DIAGNOSIS — C3490 Malignant neoplasm of unspecified part of unspecified bronchus or lung: Secondary | ICD-10-CM | POA: Diagnosis not present

## 2023-05-15 DIAGNOSIS — I159 Secondary hypertension, unspecified: Secondary | ICD-10-CM

## 2023-05-15 MED ORDER — ALBUTEROL SULFATE HFA 108 (90 BASE) MCG/ACT IN AERS: 2.0000 | INHALATION_SPRAY | Freq: Four times a day (QID) | RESPIRATORY_TRACT | 2 refills | Status: DC | PRN

## 2023-05-15 NOTE — Progress Notes (Addendum)
History of Present Illness Rhonda Conrad is a 89 y.o. female former smoker ( Quit 40 years ago)with  Abnormal findings on a CT scan of the chest done in the ED 04/14/2023. She was referred by oncology to Dr. Delton Coombes evaluation of this finding.  Synopsis 89 year old former smoker (10 pk-yrs) with a history of hypertension, diabetes, hyperlipidemia, and chronic kidney disease, was referred for evaluation of abnormal findings on a CT scan of the chest. The scan was performed during an emergency room visit for headache and hypertension. The CT angiography revealed an increase in the size of a previously biopsied left axillary mass, now measuring 6.5 by 5.7 centimeters. Additionally, there were increased pulmonary nodules, including a lateral subsolid right middle lobe nodule measuring 2.7 centimeters with an 8 millimeter solid component. This nodule was previously completely ground glass on comparison CT.   Previous axillary lymph nodal biopsies were negative for malignancy and flow cytometry did not show a monoclonal population. The patient was seen by oncology 03/25/23 and referred to Dr. Delton Coombes for consideration of bronchoscopy for biopsy and tissue diagnosis.   She was seen by Dr. Delton Coombes 04/19/2023 and she was taken for bronchoscopy with biopsy 05/06/2023. She is here today for follow up after her procedure and to review cytology.     05/15/2023 Pt. Presents for follow up  after bronchoscopy with biopsies. She is here with her caregiver and her daughter is present by phone.Pt. states she did well after her bronchoscopy with biopsies.  had one episode of blood in her sputum the day of the procedure, and this self resolved. She denies fever or discolored secretions.She did have some nausea which lasted the day of the procedure. She does have dyspnea with exertion at baseline.She states she walks very little and uses wheelchairs when she is at doctors offices.   She did complain of a knot in her throat  when she swallowed , but she states that this has self resolved. Upon examination there is no indication of a knot, swallow is normal.   We discussed the patient cytology reports.  The left upper lobe biopsies were negative for malignant cells however the right middle lobe brushing and fine-needle aspiration were positive for adenocarcinoma.  Patient was seen by medical oncology on 05/13/2023.  Patient has been referred to radiation oncology for treatment of her multifocal adenocarcinoma in situ.  There is also a solid left upper lobe nodule that has increased in size and is concerning for primary bronchogenic adenocarcinoma, which was hypermetabolic on PET scan however negative on biopsy. Patient had a CT of the head performed in the ED which showed meningioma however given her age they are not interested in intervention at this time. Patient also had large left axillary mass that was biopsied  several times in the past and it was negative for malignancy . Per the oncology note, patient is not a good candidate for any systemic therapy for the multifocal disease because of her age and comorbidities.  Medical oncology recommended that she see radiation oncology for consideration of curative SBRT to the bilateral pulmonary nodules followed by close monitoring and observation by radiation oncology.  Both the patient and her daughter are in agreement with this plan per the oncology note.  As patient has complained of dyspnea with exertion and  has never had an albuterol inhaler . I have ordered one for her to try and to see if this brings her any relief.    Test Results: Cytology 05/06/2023  FINAL MICROSCOPIC DIAGNOSIS:  A. LUNG, RML, BRUSHING:  - Adenocarcinoma   B. LUNG, RML, FINE NEEDLE ASPIRATION  BIOPSY:  - Adenocarcinoma    FINAL MICROSCOPIC DIAGNOSIS:  C. LUNG, LUL, BRUSHING:  - No malignant cells identified  - Benign bronchial cells and macrophages   D. LUNG, LUL, FINE NEEDLE ASPIRTION   BIOPSY:  -No malignant cells identified     CTA Chest 04/05/2023 IMPRESSION: 2.0 cm solid left upper lobe nodule, compatible with primary bronchogenic carcinoma.   2.9 cm subsolid right middle lobe nodule, suggesting additional primary bronchogenic carcinoma, likely semi invasive adenocarcinoma.   6.1 cm short axis left axillary nodal mass, chronic but mildly progressive, previously yielding benign biopsy results. This is unlikely to be related to the patient's suspected multifocal primary bronchogenic carcinoma but likely warrants repeat biopsy given continued progression.   No findings suspicious for metastatic disease.       Latest Ref Rng & Units 05/13/2023    9:56 AM 04/23/2023   10:08 AM 04/13/2023    7:17 PM  CBC  WBC 4.0 - 10.5 K/uL 11.1  9.9  8.8   Hemoglobin 12.0 - 15.0 g/dL 04.5  40.9  81.1   Hematocrit 36.0 - 46.0 % 40.9  38.5  40.9   Platelets 150 - 400 K/uL 109  PLATELET CLUMPS NOTED ON SMEAR, UNABLE TO ESTIMATE  167        Latest Ref Rng & Units 05/13/2023    9:56 AM 05/06/2023    6:40 AM 04/23/2023   10:08 AM  BMP  Glucose 70 - 99 mg/dL 914  88  782   BUN 8 - 23 mg/dL 32  33  8   Creatinine 0.44 - 1.00 mg/dL 9.56  2.13  0.86   Sodium 135 - 145 mmol/L 138  132  128   Potassium 3.5 - 5.1 mmol/L 4.4  3.5  3.7   Chloride 98 - 111 mmol/L 103  101  95   CO2 22 - 32 mmol/L 28  19  20    Calcium 8.9 - 10.3 mg/dL 9.5  9.3  9.3     BNP    Component Value Date/Time   BNP 104.7 (H) 04/13/2023 2200    ProBNP No results found for: "PROBNP"  PFT No results found for: "FEV1PRE", "FEV1POST", "FVCPRE", "FVCPOST", "TLC", "DLCOUNC", "PREFEV1FVCRT", "PSTFEV1FVCRT"  DG Chest Port 1 View  Result Date: 05/06/2023 CLINICAL DATA:  Status post bronchoscopy with biopsy. Evaluate for pneumothorax. EXAM: PORTABLE CHEST 1 VIEW COMPARISON:  Chest CT 04/14/2023 FINDINGS: Hazy densities in left upper lung most likely associated with the patient's known pulmonary nodule and  could represent post biopsy changes. Negative for pneumothorax. Again noted are surgical clips in the left axilla. Right lung is clear. Heart and mediastinum are within normal limits. Atherosclerotic calcifications at the aortic arch. IMPRESSION: 1. Negative for pneumothorax. 2. Hazy densities in the left upper lung could be related to post biopsy changes and known nodule in this area. Electronically Signed   By: Richarda Overlie M.D.   On: 05/06/2023 10:02   DG C-ARM BRONCHOSCOPY  Result Date: 05/06/2023 C-ARM BRONCHOSCOPY: Fluoroscopy was utilized by the requesting physician.  No radiographic interpretation.   CT ABDOMEN PELVIS W CONTRAST  Result Date: 04/23/2023 CLINICAL DATA:  Abdominal pain. EXAM: CT ABDOMEN AND PELVIS WITH CONTRAST TECHNIQUE: Multidetector CT imaging of the abdomen and pelvis was performed using the standard protocol following bolus administration of intravenous contrast. RADIATION DOSE REDUCTION: This exam was performed according to  the departmental dose-optimization program which includes automated exposure control, adjustment of the mA and/or kV according to patient size and/or use of iterative reconstruction technique. CONTRAST:  75mL OMNIPAQUE IOHEXOL 350 MG/ML SOLN COMPARISON:  CT abdomen pelvis dated 03/06/2023. FINDINGS: Lower chest: The visualized lung bases are clear. No intra-abdominal free air or free fluid. Hepatobiliary: Subcentimeter hypodense focus in the left lobe of the liver is too small to characterize. No biliary dilatation. The gallbladder is unremarkable. Pancreas: Unremarkable. No pancreatic ductal dilatation or surrounding inflammatory changes. Spleen: Normal in size without focal abnormality. Adrenals/Urinary Tract: The right adrenal glands unremarkable. There is a 17 mm indeterminate left adrenal nodule. Small bilateral renal cysts and additional subcentimeter hypodense lesions which are too small to characterize. There is no hydronephrosis on either side. There  is symmetric enhancement and excretion of contrast by both kidneys. The visualized ureters and urinary bladder appear unremarkable. Stomach/Bowel: There is no bowel obstruction or active inflammation. The appendix is normal. Vascular/Lymphatic: Mild aortoiliac atherosclerotic disease. The IVC is unremarkable. No portal venous gas. There is no adenopathy. Reproductive: The uterus is anteverted. Apparent faint nodular density in the endometrium (70/4) not evaluated on this CT. Further evaluation with ultrasound on a nonemergent/outpatient basis recommended. No adnexal masses. Other: None Musculoskeletal: Osteopenia with degenerative changes. No acute osseous pathology. IMPRESSION: 1. No acute intra-abdominal or pelvic pathology. 2. Apparent faint nodular density in the endometrium not evaluated on this CT. Further evaluation with ultrasound on a nonemergent/outpatient basis recommended. 3.  Aortic Atherosclerosis (ICD10-I70.0). Electronically Signed   By: Elgie Collard M.D.   On: 04/23/2023 22:22   CT Lumbar Spine Wo Contrast  Result Date: 04/23/2023 CLINICAL DATA:  Metastatic disease evaluation, weakness EXAM: CT LUMBAR SPINE WITHOUT CONTRAST TECHNIQUE: Multidetector CT imaging of the lumbar spine was performed without intravenous contrast administration. Multiplanar CT image reconstructions were also generated. RADIATION DOSE REDUCTION: This exam was performed according to the departmental dose-optimization program which includes automated exposure control, adjustment of the mA and/or kV according to patient size and/or use of iterative reconstruction technique. COMPARISON:  03/06/2023 CT abdomen pelvis, no prior CT the lumbar spine available FINDINGS: Segmentation: 5 lumbar type vertebral bodies. Alignment: Dextrocurvature.  Trace retrolisthesis of L1 on L2. Vertebrae: No acute fracture or focal pathologic process. Endplate degenerative changes at L2-L3, L3-L4, and L4-L5, eccentric to the left. Paraspinal  and other soft tissues: Please see same-day CT abdomen pelvis. Disc levels: T12-L1: Minimal disc bulge with left foraminal and extreme lateral protrusion. Mild facet arthropathy. No spinal canal stenosis or neural foraminal narrowing. L1-L2: Trace retrolisthesis and left eccentric disc bulge. Mild facet arthropathy. No spinal canal stenosis or neural foraminal narrowing. L2-L3: Disc height loss and disc osteophyte complex with moderate disc bulge. Moderate facet arthropathy. Narrowing of the lateral recesses. Mild to moderate spinal canal stenosis. Moderate bilateral neural foraminal narrowing. L3-L4: Mild disc bulge. Moderate facet arthropathy. Ligamentum flavum hypertrophy. Moderate to severe spinal canal stenosis. Effacement of the lateral recesses. Severe left and mild right neural foraminal narrowing. L4-L5: Disc height loss and disc osteophyte complex with moderate disc bulge. Moderate facet arthropathy. Ligamentum flavum hypertrophy. Narrowing of the lateral recesses. Moderate spinal canal stenosis. Moderate left and mild right neural foraminal narrowing. L5-S1: Mild disc bulge with superimposed left subarticular disc protrusion, which effaces the left lateral recess. Moderate to severe facet arthropathy. Ligamentum flavum hypertrophy. No spinal canal stenosis. Mild left neural foraminal narrowing. IMPRESSION: 1. No acute fracture or traumatic listhesis. No evidence of metastatic disease in the  lumbar spine. 2. L3-L4 moderate to severe spinal canal stenosis with severe left and mild right neural foraminal narrowing. Effacement of the lateral recesses at this level likely compresses the descending L4 nerve roots. 3. L4-L5 moderate spinal canal stenosis with moderate left and mild right neural foraminal narrowing. 4. L2-L3 mild to moderate spinal canal stenosis and moderate bilateral neural foraminal narrowing. 5. L5-S1 mild left neural foraminal narrowing. Electronically Signed   By: Wiliam Ke M.D.   On:  04/23/2023 21:07   DG Chest 2 View  Result Date: 04/23/2023 CLINICAL DATA:  hx of lung masses, assess for interval growth EXAM: CHEST - 2 VIEW COMPARISON:  Chest radiograph from 12/01/2016 and CT angiography chest from 04/14/2023. FINDINGS: Redemonstration of a lobulated approximately 1.5 x 1.8 cm nodule in the left lung upper lobe. Exact comparison with prior CT scan is limited due to difference in technique. Consider CT scan examination in the future if accurate comparison is desired. There are nonspecific opacities overlying the right mid lung zone which are not well evaluated on the current examination but likely corresponds to ground-glass opacities seen in the middle lobe on prior chest CT scan. There is additional nonspecific opacity just above the left lateral costophrenic angle, which corresponds to peripheral fibrosis/scarring on the prior CT scan. These can also be better evaluated with chest CT scan, if clinically desired. Bilateral lung fields are otherwise clear. No acute consolidation or lung collapse. Bilateral costophrenic angles are clear. Normal cardio-mediastinal silhouette. No acute osseous abnormalities. The soft tissues are within normal limits. IMPRESSION: *No acute cardiopulmonary abnormality. *Redemonstration of 1.5 x 1.8 cm nodule in the left lung upper lobe an additional nonspecific opacities in the right mid lung zone and left lower lung zone. Please see above for details. Electronically Signed   By: Jules Schick M.D.   On: 04/23/2023 14:49     Past medical hx Past Medical History:  Diagnosis Date   Anxiety    Arthritis    Diabetes mellitus without complication (HCC)    Headache    High cholesterol    Hypertension      Social History   Tobacco Use   Smoking status: Former   Smokeless tobacco: Never  Vaping Use   Vaping status: Never Used  Substance Use Topics   Alcohol use: No   Drug use: No    Ms.Afshar reports that she has quit smoking. She has never  used smokeless tobacco. She reports that she does not drink alcohol and does not use drugs.  Tobacco Cessation: Former smoker, quit 40 years ago   Past surgical hx, Family hx, Social hx all reviewed.  Current Outpatient Medications on File Prior to Visit  Medication Sig   acetaminophen (TYLENOL) 500 MG tablet Take 500 mg by mouth every 8 (eight) hours as needed for mild pain or headache.    amLODipine (NORVASC) 10 MG tablet Take 10 mg by mouth daily.   aspirin EC 81 MG EC tablet Take 1 tablet (81 mg total) by mouth daily.   busPIRone (BUSPAR) 7.5 MG tablet Take 10 mg by mouth 2 (two) times daily as needed (anxiety).   colchicine (COLCRYS) 0.6 MG tablet Take 0.6 mg by mouth 2 (two) times daily as needed (gout).    diclofenac sodium (VOLTAREN) 1 % GEL Apply 2 g topically 2 (two) times daily as needed (arthritic pain).   gabapentin (NEURONTIN) 100 MG capsule Take 100 mg by mouth at bedtime.   HYDROcodone-acetaminophen (NORCO/VICODIN) 5-325 MG tablet Take  1-2 tablets by mouth every 4 (four) hours as needed for moderate pain or severe pain.   insulin glargine (LANTUS) 100 UNIT/ML injection Inject 34 Units into the skin 2 (two) times daily.    losartan (COZAAR) 100 MG tablet Take 100 mg by mouth daily   ondansetron (ZOFRAN) 4 MG tablet Take 1 tablet (4 mg total) by mouth every 6 (six) hours.   oxyCODONE-acetaminophen (PERCOCET) 7.5-325 MG per tablet Take 0.5-1 tablets by mouth 2 (two) times daily as needed for severe pain.    pantoprazole (PROTONIX) 40 MG tablet Take 40 mg by mouth 2 (two) times daily as needed (heartburn).    rosuvastatin (CRESTOR) 5 MG tablet Take 5 mg by mouth at bedtime.   sitaGLIPtin-metformin (JANUMET) 50-500 MG per tablet Take 1 tablet by mouth daily.   tetracycline (SUMYCIN) 500 MG capsule Take 500 mg by mouth 4 (four) times daily.   tiZANidine (ZANAFLEX) 2 MG tablet Take 2 mg by mouth at bedtime as needed for muscle pain   triamterene-hydrochlorothiazide (MAXZIDE-25)  37.5-25 MG tablet Take 1 tablet by mouth in the morning   ALPRAZolam (XANAX) 0.5 MG tablet Take 0.5 mg by mouth 3 (three) times daily as needed for anxiety.  (Patient not taking: Reported on 04/30/2023)   metroNIDAZOLE (FLAGYL) 250 MG tablet Take 250 mg by mouth 4 (four) times daily. (Patient not taking: Reported on 05/15/2023)   No current facility-administered medications on file prior to visit.     Allergies  Allergen Reactions   Penicillins Anaphylaxis, Hives, Rash and Other (See Comments)    Has patient had a PCN reaction causing immediate rash, facial/tongue/throat swelling, SOB or lightheadedness with hypotension: Yes Has patient had a PCN reaction causing severe rash involving mucus membranes or skin necrosis: Yes Has patient had a PCN reaction that required hospitalization No Has patient had a PCN reaction occurring within the last 10 years: Yes If all of the above answers are "NO", then may proceed with Cephalosporin use.     Review Of Systems:  Constitutional:   No  weight loss, night sweats,  Fevers, chills, +fatigue, or  lassitude.  HEENT:   No headaches,  Difficulty swallowing,  Tooth/dental problems, or  +Sore throat, which has resolved               No sneezing, itching, ear ache, nasal congestion, post nasal drip,   CV:  No chest pain,  Orthopnea, PND, swelling in lower extremities, anasarca, dizziness, palpitations, syncope.   GI  No heartburn, indigestion, abdominal pain, nausea, vomiting, diarrhea, change in bowel habits, loss of appetite, bloody stools.   Resp: + shortness of breath with exertion less at rest.  No excess mucus, no productive cough,  No non-productive cough,  No coughing up of blood.  No change in color of mucus.  No wheezing.  No chest wall deformity  Skin: no rash or lesions.  GU: no dysuria, change in color of urine, no urgency or frequency.  No flank pain, no hematuria   MS:  + joint pain or swelling.  + decreased range of motion.  No back  pain.  Psych:  No change in mood or affect. No depression or anxiety.  No memory loss.   Vital Signs BP 136/85   Pulse (!) 115   Temp 98.5 F (36.9 C) (Oral)   Resp (!) 22   SpO2 95%   - Non-small cell lung cancer (right middle lobe) - Biopsy of left upper lung (no malignant cells) -  Dyspnea with exertion  Physical Exam:  General- No distress,  A&Ox3, pleasant elderly female in NAD, on RA in wheelchair ENT: No sinus tenderness, TM clear, pale nasal mucosa, no oral exudate,no post nasal drip, no LAN Cardiac: S1, S2, regular rate and rhythm, no murmur Chest: No wheeze/ rales/ dullness; no accessory muscle use, no nasal flaring, no sternal retractions, slightly diminished per bases Abd.: Soft Non-tender, ND, BS + Ext: No clubbing cyanosis, edema,  Neuro:  physical deconditioning Skin: No rashes, warm and dry, no lesions  Psych: normal mood and behavior     Assessment/Plan  Post bronchoscopy with biopsies No adverse events Plan Family and care giver notified to follow up for any bleeding, fever, or change in baseline  Axillary mass Multifocal lung nodules Former smoker  Left Axillary Mass Seen by medical oncology 12/9 Interval increase in size of a previously biopsied left axillary mass, now measuring 8.2 x 4.6 x 6.8 cm. Prior biopsies were negative for malignancy. - repeat biopsy done  12/2 which was + for adenocarcinoma in the RML, LUL lobe was PET avid and remains concerning for primary bronchogenic carcinoma despite negative biopsy. Pt has already been referred to radiation oncology for curative treatment by medical oncology.. Pt is not candidate for systemic therapy.  Dyspnea on exertion Albuterol inhaler prescribes for as needed use. Plan I have sent in a prescription for an  albuterol inhaler. This will be ready at the [facility] Dr. 1-2 puffs as needed for shortness of breath or wheezing. Do not use more than 2 times daily. If you need more  frequently than that, please call to be seen.   Hypertension Hypertension Recent episodes of elevated blood pressure leading to emergency department visits. -Continue current management and monitor blood pressure. - Cards and PCP follow up - Seek emergency care as needed   DM (diabetes mellitus) Diabetes Per PCP management  Managed with medication. -Continue current management.   Hyperlipidemia Hyperlipidemia Managed with medication. Managed per cardiology and PCP -Continue current management.   Chronic kidney disease Chronic Kidney Disease Managed per renal team Managed with medication.   I spent 40 minutes dedicated to the care of this patient on the date of this encounter to include pre-visit review of records, face-to-face time with the patient discussing conditions above, post visit ordering of testing, clinical documentation with the electronic health record, making appropriate referrals as documented, and communicating necessary information to the patient's healthcare team.   Bevelyn Ngo, NP 05/15/2023  1:24 PM

## 2023-05-15 NOTE — Patient Instructions (Addendum)
It is good to see you today. I am glad you have done well post procedure.  Your biopsies are positive for adenocarcinoma of the right middle lung. This is a non-small cell lung cancer. You have appointments scheduled with radiation oncology 12/19 at 12:30 and at 1 pm.  This is a great team. They will take great care of you.  I have sent in a prescription for an  albuterol inhaler. This will be ready at the [facility] Dr. 1-2 puffs as needed for shortness of breath or wheezing. Do not use more than 2 times daily. If you need more frequently than that, please call to be seen.  Daisey Must with your treatment.  Follow up as needed. Please contact office for sooner follow up if symptoms do not improve or worsen or seek emergency care

## 2023-05-16 ENCOUNTER — Encounter (HOSPITAL_COMMUNITY): Payer: Self-pay

## 2023-05-16 ENCOUNTER — Ambulatory Visit: Payer: 59

## 2023-05-16 ENCOUNTER — Ambulatory Visit: Payer: 59 | Admitting: Radiation Oncology

## 2023-05-23 ENCOUNTER — Ambulatory Visit
Admission: RE | Admit: 2023-05-23 | Discharge: 2023-05-23 | Disposition: A | Discharge status: Home / Self Care | Payer: 59 | Source: Ambulatory Visit | Attending: Radiation Oncology | Admitting: Radiation Oncology

## 2023-05-23 ENCOUNTER — Telehealth: Payer: Self-pay | Admitting: Radiation Oncology

## 2023-05-23 ENCOUNTER — Telehealth: Payer: Self-pay

## 2023-05-23 ENCOUNTER — Ambulatory Visit: Payer: 59

## 2023-05-23 DIAGNOSIS — C3490 Malignant neoplasm of unspecified part of unspecified bronchus or lung: Secondary | ICD-10-CM | POA: Diagnosis not present

## 2023-05-23 NOTE — Telephone Encounter (Signed)
Patients daughter called in stating that patient is to weak to leave the house. Requesting appointment be scheduled in Jan. 2025

## 2023-05-23 NOTE — Telephone Encounter (Signed)
Called the daughter of the patient to r/s consultation w.Dr. Roselind Messier. No answer, LVM for a return call.

## 2023-05-24 ENCOUNTER — Encounter (HOSPITAL_COMMUNITY): Payer: Self-pay

## 2023-05-24 ENCOUNTER — Telehealth: Payer: Self-pay | Admitting: Radiation Oncology

## 2023-05-24 NOTE — Telephone Encounter (Signed)
Called patients daughter to r/s consultation w. Dr. Roselind Messier. No answer, LVM for a return call.

## 2023-05-27 ENCOUNTER — Telehealth: Payer: Self-pay | Admitting: Radiation Oncology

## 2023-05-27 NOTE — Telephone Encounter (Signed)
Called patient daughter and son to r/s consultation w. Dr. Roselind Messier. No answer, LVM for a return call.

## 2023-05-27 NOTE — Telephone Encounter (Signed)
Sent unable to contact letter 12/23.

## 2023-06-07 ENCOUNTER — Telehealth: Payer: Self-pay | Admitting: Radiation Oncology

## 2023-06-07 NOTE — Telephone Encounter (Signed)
 The daughter of the patient called to r/s consultation w. Dr. Roselind Messier due to transportation issues. Daughter requested morning appointment. Scheduled patient for next available morning consult.

## 2023-06-12 ENCOUNTER — Ambulatory Visit: Payer: 59

## 2023-06-12 ENCOUNTER — Ambulatory Visit: Payer: 59 | Admitting: Radiation Oncology

## 2023-06-20 NOTE — Progress Notes (Incomplete)
Location of tumor and Histology per Pathology Report: Right and left lungs.   Biopsy:    Past/Anticipated interventions by surgeon, if any: left   Flexible video fiberoptic bronchoscopy with robotic assistance and biopsies.   Past/Anticipated interventions by medical oncology, if any: Cassandra Hellingother PA      Pain issues, if any:  {:18581} {PAIN DESCRIPTION:[id number]}  SAFETY ISSUES: Prior radiation? {:18581} Pacemaker/ICD? {:18581} Possible current pregnancy? no Is the patient on methotrexate? {:18581}  Current Complaints / other details:  ***     ***

## 2023-06-23 NOTE — Progress Notes (Incomplete)
Radiation Oncology         ([phone]) [phone] ________________________________  Initial Outpatient Consultation  Name: Rhonda Conrad MRN: [id number]  Date: 06/24/2023  DOB: 1931-09-02  WU:[id number], Tawni Pummel, MD  Si Gaul, MD   REFERRING PHYSICIAN: Si Gaul, MD  DIAGNOSIS: There were no encounter diagnoses.  Adenocarcinoma of the right middle lung and a biopsy proven non-malignant left upper lobe pulmonary nodule with suspicious features   HISTORY OF PRESENT ILLNESS::Rhonda Conrad is a 89 y.o. female who is accompanied by ***. she is seen as a courtesy of Dr. Arbutus Ped for an opinion concerning radiation therapy as part of management for her recently diagnosed right lung cancer and left upper lobe pulmonary nodule.   The patient presented to the ED on 03/11/23 with c/o hypertension and a headache which she could no relieve with tylenol. Chest CT without contrast performed in the ED showed an increase in size of a left axillary mass measuring 6.5 cm concerning for either nodal metastasis or lymphoproliferative disorder. Per report notes, the mass was present on a prior exam performed in June of 2018 and measured 5.5 cm at that time. The axillary mass was previously biopsied on several occasions and was negative for malignancy repeatedly. CT also showed an interval increase in size of bilateral sub solid lung nodules, the largest of which being located in the RML measuring 2.7 cm, previously measuring 1.9 cm on prior exams. The RML nodule and an increasing nodule in the LUL both showed features concerning for malignancy.     She also had a CTA of the head and neck performed in the ED which showed a 2.3 cm extra axial mass in the right CP angle cistern (present on prior imaging in 2015 and 2018), likely representing a meningioma. No acute intracranial abnormalities were demonstrated otherwise.   She was accordingly seen in consultation by PA Heilingoetter and Dr. Arbutus Ped on  03/25/23 for further management. During this visit, the patient and her daughter expressed that they were not interested in any oncologic interventions due to her advanced age. However, upon further discussion with Dr. Arbutus Ped, she agreed to at least pursue biopsies and further staging work-up.   Her further staging work-up consisted of a PET scan on 04/05/23 which showed: a 2.0 cm solid LUL nodule, compatible with primary bronchogenic carcinoma; a 2.9 cm sub-solid RML nodule also concerning for primary bronchogenic carcinoma; and the chronic / mildly progressive 6.1 cm short axis left axillary nodal mass which warrants repeat biopsy. PET otherwise showed no evidence of metastatic disease elsewhere in the body.     Before she could do so, she presented to the ED on 04/13/23 with nausea with emesis, diarrhea, right leg swelling, and hypertension. Her initial ED workup was concerning for a markedly elevated D-dimer of greater than 20. A chest CT angiogram was was subsequently obtained which was negative for PE but redemonstrated the multiple lung nodules concerning for malignancy. ED course consisted of Eliquis and she was advised to return the following day for an LE doppler to rule out DVT.  Subsequent bilateral LE doppler on 11/10 showed no evidence of DVT in either LE.   The patient then met with Dr. Delton Coombes in consultation on 04/19/23 who agreed that she should proceed with a bronchoscopy to obtain biopsies of the lung nodules. As for the increasing left axillary mass, Dr. Delton Coombes has recommended close monitoring before considering repeat sampling.   She opted to proceed with bronchoscopy on 05/06/23 under the  care of Dr. Delton Coombes. FNA and brushings of the RML collected showed findings consistent with adenocarcinoma. FNA and brushing of the LUL nodule otherwise showed no evidence of malignancy.   Although the LUL nodule came back negative for malignancy, Dr. Arbutus Ped has recommended curative SBRT to the  bilateral pulmonary nodules followed by close monitoring and observation by radiation oncology. Again, she is not a candidate for systemic chemo based on her age and comorbidities.   Of note: she was also seen in the ED on 11/19 for abdominal pain and had a CT AP performed which showed no acute findings in the abdomen or pelvis.   PREVIOUS RADIATION THERAPY: No  PAST MEDICAL HISTORY:  Past Medical History:  Diagnosis Date   Anxiety    Arthritis    Diabetes mellitus without complication (HCC)    Headache    High cholesterol    Hypertension     PAST SURGICAL HISTORY: Past Surgical History:  Procedure Laterality Date   AXILLARY LYMPH NODE BIOPSY Left 01/28/2017   Procedure: LEFT AXILLARY LYMPH NODE BIOPSY;  Surgeon: Griselda Miner, MD;  Location: [facility] OR;  Service: General;  Laterality: Left;  RNFA   BRONCHIAL BIOPSY  05/06/2023   Procedure: BRONCHIAL BIOPSIES;  Surgeon: Leslye Peer, MD;  Location: [facility] ENDOSCOPY;  Service: Pulmonary;;   BRONCHIAL BRUSHINGS  05/06/2023   Procedure: BRONCHIAL BRUSHINGS;  Surgeon: Leslye Peer, MD;  Location: [facility] ENDOSCOPY;  Service: Pulmonary;;   BRONCHIAL NEEDLE ASPIRATION BIOPSY  05/06/2023   Procedure: BRONCHIAL NEEDLE ASPIRATION BIOPSIES;  Surgeon: Leslye Peer, MD;  Location: [facility] ENDOSCOPY;  Service: Pulmonary;;   CARDIAC CATHETERIZATION     HEMOSTASIS CONTROL  05/06/2023   Procedure: HEMOSTASIS CONTROL;  Surgeon: Leslye Peer, MD;  Location: [facility] ENDOSCOPY;  Service: Pulmonary;;   LEFT HEART CATHETERIZATION WITH CORONARY ANGIOGRAM N/A 06/15/2013   Procedure: LEFT HEART CATHETERIZATION WITH CORONARY ANGIOGRAM;  Surgeon: Robynn Pane, MD;  Location: [facility] CATH LAB;  Service: Cardiovascular;  Laterality: N/A;   vaginal polyp removal      FAMILY HISTORY: No family history on file.  SOCIAL HISTORY:  Social History   Tobacco Use   Smoking status: Former   Smokeless tobacco: Never  Advertising account planner   Vaping status: Never Used  Substance Use Topics    Alcohol use: No   Drug use: No    ALLERGIES:  Allergies  Allergen Reactions   Penicillins Anaphylaxis, Hives, Rash and Other (See Comments)    Has patient had a PCN reaction causing immediate rash, facial/tongue/throat swelling, SOB or lightheadedness with hypotension: Yes Has patient had a PCN reaction causing severe rash involving mucus membranes or skin necrosis: Yes Has patient had a PCN reaction that required hospitalization No Has patient had a PCN reaction occurring within the last 10 years: Yes If all of the above answers are "NO", then may proceed with Cephalosporin use.     MEDICATIONS:  Current Outpatient Medications  Medication Sig Dispense Refill   acetaminophen (TYLENOL) 500 MG tablet Take 500 mg by mouth every 8 (eight) hours as needed for mild pain or headache.      albuterol (VENTOLIN HFA) 108 (90 Base) MCG/ACT inhaler Inhale 2 puffs into the lungs every 6 (six) hours as needed for wheezing or shortness of breath. 8 g 2   ALPRAZolam (XANAX) 0.5 MG tablet Take 0.5 mg by mouth 3 (three) times daily as needed for anxiety.  (Patient not taking: Reported on 04/30/2023)  3   amLODipine (NORVASC)  10 MG tablet Take 10 mg by mouth daily.     aspirin EC 81 MG EC tablet Take 1 tablet (81 mg total) by mouth daily.     busPIRone (BUSPAR) 7.5 MG tablet Take 10 mg by mouth 2 (two) times daily as needed (anxiety).     colchicine (COLCRYS) 0.6 MG tablet Take 0.6 mg by mouth 2 (two) times daily as needed (gout).      diclofenac sodium (VOLTAREN) 1 % GEL Apply 2 g topically 2 (two) times daily as needed (arthritic pain).     gabapentin (NEURONTIN) 100 MG capsule Take 100 mg by mouth at bedtime.     HYDROcodone-acetaminophen (NORCO/VICODIN) 5-325 MG tablet Take 1-2 tablets by mouth every 4 (four) hours as needed for moderate pain or severe pain. 15 tablet 0   insulin glargine (LANTUS) 100 UNIT/ML injection Inject 34 Units into the skin 2 (two) times daily.      losartan (COZAAR) 100 MG  tablet Take 100 mg by mouth daily  2   metroNIDAZOLE (FLAGYL) 250 MG tablet Take 250 mg by mouth 4 (four) times daily. (Patient not taking: Reported on 05/15/2023)     ondansetron (ZOFRAN) 4 MG tablet Take 1 tablet (4 mg total) by mouth every 6 (six) hours. 12 tablet 0   oxyCODONE-acetaminophen (PERCOCET) 7.5-325 MG per tablet Take 0.5-1 tablets by mouth 2 (two) times daily as needed for severe pain.      pantoprazole (PROTONIX) 40 MG tablet Take 40 mg by mouth 2 (two) times daily as needed (heartburn).      rosuvastatin (CRESTOR) 5 MG tablet Take 5 mg by mouth at bedtime.     sitaGLIPtin-metformin (JANUMET) 50-500 MG per tablet Take 1 tablet by mouth daily.     tetracycline (SUMYCIN) 500 MG capsule Take 500 mg by mouth 4 (four) times daily.     tiZANidine (ZANAFLEX) 2 MG tablet Take 2 mg by mouth at bedtime as needed for muscle pain  3   triamterene-hydrochlorothiazide (MAXZIDE-25) 37.5-25 MG tablet Take 1 tablet by mouth in the morning  0   No current facility-administered medications for this encounter.    REVIEW OF SYSTEMS:  A 10+ POINT REVIEW OF SYSTEMS WAS OBTAINED including neurology, dermatology, psychiatry, cardiac, respiratory, lymph, extremities, GI, GU, musculoskeletal, constitutional, reproductive, HEENT. ***   PHYSICAL EXAM:  vitals were not taken for this visit.   General: Alert and oriented, in no acute distress HEENT: Head is normocephalic. Extraocular movements are intact. Oropharynx is clear. Neck: Neck is supple, no palpable cervical or supraclavicular lymphadenopathy. Heart: Regular in rate and rhythm with no murmurs, rubs, or gallops. Chest: Clear to auscultation bilaterally, with no rhonchi, wheezes, or rales. Abdomen: Soft, nontender, nondistended, with no rigidity or guarding. Extremities: No cyanosis or edema. Lymphatics: see Neck Exam Skin: No concerning lesions. Musculoskeletal: symmetric strength and muscle tone throughout. Neurologic: Cranial nerves II through  XII are grossly intact. No obvious focalities. Speech is fluent. Coordination is intact. Psychiatric: Judgment and insight are intact. Affect is appropriate. ***  ECOG = ***  0 - Asymptomatic (Fully active, able to carry on all predisease activities without restriction)  1 - Symptomatic but completely ambulatory (Restricted in physically strenuous activity but ambulatory and able to carry out work of a light or sedentary nature. For example, light housework, office work)  2 - Symptomatic, <50% in bed during the day (Ambulatory and capable of all self care but unable to carry out any work activities. Up and about more than  50% of waking hours)  3 - Symptomatic, >50% in bed, but not bedbound (Capable of only limited self-care, confined to bed or chair 50% or more of waking hours)  4 - Bedbound (Completely disabled. Cannot carry on any self-care. Totally confined to bed or chair)  5 - Death   Santiago Glad MM, Creech RH, Tormey DC, et al. [phone]). "Toxicity and response criteria of the [facility]". Am. Evlyn Clines. Oncol. 5 (6): 649-55  LABORATORY DATA:  Lab Results  Component Value Date   WBC 11.1 (H) 05/13/2023   HGB 13.6 05/13/2023   HCT 40.9 05/13/2023   MCV 89.1 05/13/2023   PLT 109 (L) 05/13/2023   NEUTROABS 8.4 (H) 05/13/2023   Lab Results  Component Value Date   NA 138 05/13/2023   K 4.4 05/13/2023   CL 103 05/13/2023   CO2 28 05/13/2023   GLUCOSE 218 (H) 05/13/2023   BUN 32 (H) 05/13/2023   CREATININE 1.09 (H) 05/13/2023   CALCIUM 9.5 05/13/2023      RADIOGRAPHY: No results found.     IMPRESSION: Adenocarcinoma of the right middle lung and a biopsy proven non-malignant left upper lobe pulmonary nodule with suspicious features   ***  Today, I talked to the patient and family about the findings and work-up thus far.  We discussed the natural history of *** and general treatment, highlighting the role of radiotherapy in the management.  We discussed the  available radiation techniques, and focused on the details of logistics and delivery.  We reviewed the anticipated acute and late sequelae associated with radiation in this setting.  The patient was encouraged to ask questions that I answered to the best of my ability. *** A patient consent form was discussed and signed.  We retained a copy for our records.  The patient would like to proceed with radiation and will be scheduled for CT simulation.  PLAN: ***    *** minutes of total time was spent for this patient encounter, including preparation, face-to-face counseling with the patient and coordination of care, physical exam, and documentation of the encounter.   ------------------------------------------------  Billie Lade, PhD, MD  This document serves as a record of services personally performed by Antony Blackbird, MD. It was created on his behalf by Neena Rhymes, a trained medical scribe. The creation of this record is based on the scribe's personal observations and the provider's statements to them. This document has been checked and approved by the attending provider.

## 2023-06-24 ENCOUNTER — Telehealth: Payer: Self-pay | Admitting: Radiation Oncology

## 2023-06-24 ENCOUNTER — Ambulatory Visit: Payer: 59

## 2023-06-24 ENCOUNTER — Telehealth: Payer: Self-pay

## 2023-06-24 ENCOUNTER — Ambulatory Visit
Admission: RE | Admit: 2023-06-24 | Discharge: 2023-06-24 | Disposition: A | Discharge status: Home / Self Care | Payer: 59 | Source: Ambulatory Visit | Attending: Radiation Oncology | Admitting: Radiation Oncology

## 2023-06-24 NOTE — Telephone Encounter (Signed)
1/20 @ 8:20 am patient's daughter called to let someone know patient is to weak to get out of bed, she need to cancel her consult for today.

## 2023-07-04 DIAGNOSIS — Z794 Long term (current) use of insulin: Secondary | ICD-10-CM | POA: Diagnosis not present

## 2023-07-04 DIAGNOSIS — E119 Type 2 diabetes mellitus without complications: Secondary | ICD-10-CM | POA: Diagnosis not present

## 2023-07-04 DIAGNOSIS — I10 Essential (primary) hypertension: Secondary | ICD-10-CM | POA: Diagnosis not present

## 2023-07-04 DIAGNOSIS — C801 Malignant (primary) neoplasm, unspecified: Secondary | ICD-10-CM | POA: Diagnosis not present

## 2023-07-04 DIAGNOSIS — E7800 Pure hypercholesterolemia, unspecified: Secondary | ICD-10-CM | POA: Diagnosis not present

## 2023-07-04 DIAGNOSIS — G894 Chronic pain syndrome: Secondary | ICD-10-CM | POA: Diagnosis not present

## 2023-07-04 DIAGNOSIS — R1030 Lower abdominal pain, unspecified: Secondary | ICD-10-CM | POA: Diagnosis not present

## 2023-07-04 DIAGNOSIS — R000 Tachycardia, unspecified: Secondary | ICD-10-CM | POA: Diagnosis not present

## 2023-07-04 DIAGNOSIS — M150 Primary generalized (osteo)arthritis: Secondary | ICD-10-CM | POA: Diagnosis not present

## 2023-07-04 DIAGNOSIS — N183 Chronic kidney disease, stage 3 unspecified: Secondary | ICD-10-CM | POA: Diagnosis not present

## 2023-07-08 ENCOUNTER — Emergency Department (HOSPITAL_COMMUNITY): Payer: 59

## 2023-07-08 ENCOUNTER — Inpatient Hospital Stay (HOSPITAL_COMMUNITY)
Admission: EM | Admit: 2023-07-08 | Discharge: 2023-08-03 | DRG: 637 | Disposition: E | Payer: 59 | Attending: Internal Medicine | Admitting: Internal Medicine

## 2023-07-08 DIAGNOSIS — Z1152 Encounter for screening for COVID-19: Secondary | ICD-10-CM

## 2023-07-08 DIAGNOSIS — E1122 Type 2 diabetes mellitus with diabetic chronic kidney disease: Secondary | ICD-10-CM | POA: Diagnosis not present

## 2023-07-08 DIAGNOSIS — I129 Hypertensive chronic kidney disease with stage 1 through stage 4 chronic kidney disease, or unspecified chronic kidney disease: Secondary | ICD-10-CM | POA: Diagnosis present

## 2023-07-08 DIAGNOSIS — Z880 Allergy status to penicillin: Secondary | ICD-10-CM

## 2023-07-08 DIAGNOSIS — G9341 Metabolic encephalopathy: Secondary | ICD-10-CM | POA: Diagnosis present

## 2023-07-08 DIAGNOSIS — E161 Other hypoglycemia: Secondary | ICD-10-CM | POA: Diagnosis not present

## 2023-07-08 DIAGNOSIS — R609 Edema, unspecified: Secondary | ICD-10-CM | POA: Diagnosis not present

## 2023-07-08 DIAGNOSIS — I959 Hypotension, unspecified: Secondary | ICD-10-CM | POA: Diagnosis not present

## 2023-07-08 DIAGNOSIS — Z794 Long term (current) use of insulin: Secondary | ICD-10-CM | POA: Diagnosis not present

## 2023-07-08 DIAGNOSIS — D329 Benign neoplasm of meninges, unspecified: Secondary | ICD-10-CM | POA: Diagnosis present

## 2023-07-08 DIAGNOSIS — Z79899 Other long term (current) drug therapy: Secondary | ICD-10-CM | POA: Diagnosis not present

## 2023-07-08 DIAGNOSIS — J9601 Acute respiratory failure with hypoxia: Secondary | ICD-10-CM | POA: Diagnosis not present

## 2023-07-08 DIAGNOSIS — E11649 Type 2 diabetes mellitus with hypoglycemia without coma: Secondary | ICD-10-CM | POA: Diagnosis not present

## 2023-07-08 DIAGNOSIS — J690 Pneumonitis due to inhalation of food and vomit: Secondary | ICD-10-CM | POA: Diagnosis present

## 2023-07-08 DIAGNOSIS — G931 Anoxic brain damage, not elsewhere classified: Secondary | ICD-10-CM | POA: Diagnosis present

## 2023-07-08 DIAGNOSIS — Z743 Need for continuous supervision: Secondary | ICD-10-CM | POA: Diagnosis not present

## 2023-07-08 DIAGNOSIS — D696 Thrombocytopenia, unspecified: Secondary | ICD-10-CM | POA: Diagnosis present

## 2023-07-08 DIAGNOSIS — C3412 Malignant neoplasm of upper lobe, left bronchus or lung: Secondary | ICD-10-CM | POA: Diagnosis not present

## 2023-07-08 DIAGNOSIS — Z7982 Long term (current) use of aspirin: Secondary | ICD-10-CM | POA: Diagnosis not present

## 2023-07-08 DIAGNOSIS — G9340 Encephalopathy, unspecified: Secondary | ICD-10-CM | POA: Diagnosis not present

## 2023-07-08 DIAGNOSIS — N1831 Chronic kidney disease, stage 3a: Secondary | ICD-10-CM | POA: Diagnosis present

## 2023-07-08 DIAGNOSIS — E162 Hypoglycemia, unspecified: Secondary | ICD-10-CM | POA: Diagnosis not present

## 2023-07-08 DIAGNOSIS — R911 Solitary pulmonary nodule: Secondary | ICD-10-CM | POA: Diagnosis not present

## 2023-07-08 DIAGNOSIS — Z87891 Personal history of nicotine dependence: Secondary | ICD-10-CM

## 2023-07-08 DIAGNOSIS — I499 Cardiac arrhythmia, unspecified: Secondary | ICD-10-CM | POA: Diagnosis not present

## 2023-07-08 DIAGNOSIS — Z515 Encounter for palliative care: Secondary | ICD-10-CM

## 2023-07-08 DIAGNOSIS — R000 Tachycardia, unspecified: Secondary | ICD-10-CM | POA: Diagnosis not present

## 2023-07-08 DIAGNOSIS — E7800 Pure hypercholesterolemia, unspecified: Secondary | ICD-10-CM | POA: Diagnosis present

## 2023-07-08 DIAGNOSIS — R509 Fever, unspecified: Secondary | ICD-10-CM

## 2023-07-08 DIAGNOSIS — E119 Type 2 diabetes mellitus without complications: Secondary | ICD-10-CM | POA: Diagnosis not present

## 2023-07-08 DIAGNOSIS — Z4682 Encounter for fitting and adjustment of non-vascular catheter: Secondary | ICD-10-CM | POA: Diagnosis not present

## 2023-07-08 DIAGNOSIS — Z7189 Other specified counseling: Secondary | ICD-10-CM | POA: Diagnosis not present

## 2023-07-08 DIAGNOSIS — I10 Essential (primary) hypertension: Secondary | ICD-10-CM | POA: Diagnosis not present

## 2023-07-08 DIAGNOSIS — I700 Atherosclerosis of aorta: Secondary | ICD-10-CM | POA: Diagnosis not present

## 2023-07-08 DIAGNOSIS — R519 Headache, unspecified: Secondary | ICD-10-CM | POA: Diagnosis not present

## 2023-07-08 DIAGNOSIS — E1165 Type 2 diabetes mellitus with hyperglycemia: Secondary | ICD-10-CM | POA: Diagnosis not present

## 2023-07-08 DIAGNOSIS — Z66 Do not resuscitate: Secondary | ICD-10-CM | POA: Diagnosis not present

## 2023-07-08 DIAGNOSIS — R17 Unspecified jaundice: Secondary | ICD-10-CM | POA: Diagnosis not present

## 2023-07-08 DIAGNOSIS — R569 Unspecified convulsions: Secondary | ICD-10-CM | POA: Diagnosis not present

## 2023-07-08 DIAGNOSIS — M1990 Unspecified osteoarthritis, unspecified site: Secondary | ICD-10-CM | POA: Diagnosis present

## 2023-07-08 DIAGNOSIS — R6889 Other general symptoms and signs: Secondary | ICD-10-CM | POA: Diagnosis not present

## 2023-07-08 DIAGNOSIS — R220 Localized swelling, mass and lump, head: Secondary | ICD-10-CM | POA: Diagnosis not present

## 2023-07-08 LAB — COMPREHENSIVE METABOLIC PANEL
ALT: 24 U/L (ref 0–44)
AST: 46 U/L — ABNORMAL HIGH (ref 15–41)
Albumin: 2.9 g/dL — ABNORMAL LOW (ref 3.5–5.0)
Alkaline Phosphatase: 67 U/L (ref 38–126)
Anion gap: 16 — ABNORMAL HIGH (ref 5–15)
BUN: 11 mg/dL (ref 8–23)
CO2: 23 mmol/L (ref 22–32)
Calcium: 8 mg/dL — ABNORMAL LOW (ref 8.9–10.3)
Chloride: 96 mmol/L — ABNORMAL LOW (ref 98–111)
Creatinine, Ser: 0.87 mg/dL (ref 0.44–1.00)
GFR, Estimated: >60 mL/min (ref >60)
Glucose, Bld: 70 mg/dL (ref 70–99)
Potassium: 4 mmol/L (ref 3.5–5.1)
Sodium: 135 mmol/L (ref 135–145)
Total Bilirubin: 1.3 mg/dL — ABNORMAL HIGH (ref 0.0–1.2)
Total Protein: 5.8 g/dL — ABNORMAL LOW (ref 6.5–8.1)

## 2023-07-08 LAB — RESP PANEL BY RT-PCR (RSV, FLU A&B, COVID)  RVPGX2
Influenza A by PCR: NEGATIVE
Influenza B by PCR: NEGATIVE
Resp Syncytial Virus by PCR: NEGATIVE
SARS Coronavirus 2 by RT PCR: NEGATIVE

## 2023-07-08 LAB — CBC WITH DIFFERENTIAL/PLATELET
Abs Immature Granulocytes: 0.16 10*3/uL — ABNORMAL HIGH (ref 0.00–0.07)
Basophils Absolute: 0 10*3/uL (ref 0.0–0.1)
Basophils Relative: 0 %
Eosinophils Absolute: 0 10*3/uL (ref 0.0–0.5)
Eosinophils Relative: 0 %
HCT: 46.9 % — ABNORMAL HIGH (ref 36.0–46.0)
Hemoglobin: 15.9 g/dL — ABNORMAL HIGH (ref 12.0–15.0)
Immature Granulocytes: 1 %
Lymphocytes Relative: 7 %
Lymphs Abs: 1.1 10*3/uL (ref 0.7–4.0)
MCH: 29.9 pg (ref 26.0–34.0)
MCHC: 33.9 g/dL (ref 30.0–36.0)
MCV: 88.2 fL (ref 80.0–100.0)
Monocytes Absolute: 0.9 10*3/uL (ref 0.1–1.0)
Monocytes Relative: 6 %
Neutro Abs: 13.5 10*3/uL — ABNORMAL HIGH (ref 1.7–7.7)
Neutrophils Relative %: 86 %
Platelets: 85 10*3/uL — ABNORMAL LOW (ref 150–400)
RBC: 5.32 MIL/uL — ABNORMAL HIGH (ref 3.87–5.11)
RDW: 14.6 % (ref 11.5–15.5)
WBC: 15.7 10*3/uL — ABNORMAL HIGH (ref 4.0–10.5)
nRBC: 0 % (ref 0.0–0.2)

## 2023-07-08 LAB — I-STAT VENOUS BLOOD GAS, ED
Acid-Base Excess: 4 mmol/L — ABNORMAL HIGH (ref 0.0–2.0)
Bicarbonate: 27 mmol/L (ref 20.0–28.0)
Calcium, Ion: 0.96 mmol/L — ABNORMAL LOW (ref 1.15–1.40)
HCT: 50 % — ABNORMAL HIGH (ref 36.0–46.0)
Hemoglobin: 17 g/dL — ABNORMAL HIGH (ref 12.0–15.0)
O2 Saturation: 87 %
Potassium: 3.8 mmol/L (ref 3.5–5.1)
Sodium: 135 mmol/L (ref 135–145)
TCO2: 28 mmol/L (ref 22–32)
pCO2, Ven: 35.6 mm[Hg] — ABNORMAL LOW (ref 44–60)
pH, Ven: 7.488 — ABNORMAL HIGH (ref 7.25–7.43)
pO2, Ven: 49 mm[Hg] — ABNORMAL HIGH (ref 32–45)

## 2023-07-08 LAB — CBG MONITORING, ED
Glucose-Capillary: 45 mg/dL — ABNORMAL LOW (ref 70–99)
Glucose-Capillary: 71 mg/dL (ref 70–99)
Glucose-Capillary: 206 mg/dL — ABNORMAL HIGH (ref 70–99)

## 2023-07-08 LAB — I-STAT ARTERIAL BLOOD GAS, ED
Acid-Base Excess: 2 mmol/L (ref 0.0–2.0)
Bicarbonate: 23.9 mmol/L (ref 20.0–28.0)
Calcium, Ion: 1.04 mmol/L — ABNORMAL LOW (ref 1.15–1.40)
HCT: 38 % (ref 36.0–46.0)
Hemoglobin: 12.9 g/dL (ref 12.0–15.0)
O2 Saturation: 100 %
Patient temperature: 101.2
Potassium: 2.9 mmol/L — ABNORMAL LOW (ref 3.5–5.1)
Sodium: 130 mmol/L — ABNORMAL LOW (ref 135–145)
TCO2: 25 mmol/L (ref 22–32)
pCO2 arterial: 30.4 mm[Hg] — ABNORMAL LOW (ref 32–48)
pH, Arterial: 7.508 — ABNORMAL HIGH (ref 7.35–7.45)
pO2, Arterial: 545 mm[Hg] — ABNORMAL HIGH (ref 83–108)

## 2023-07-08 LAB — URINALYSIS, W/ REFLEX TO CULTURE (INFECTION SUSPECTED)
Bacteria, UA: NONE SEEN
Bilirubin Urine: NEGATIVE
Glucose, UA: NEGATIVE mg/dL
Ketones, ur: NEGATIVE mg/dL
Leukocytes,Ua: NEGATIVE
Nitrite: NEGATIVE
Protein, ur: 100 mg/dL — AB
RBC / HPF: >50 RBC/hpf (ref 0–5)
Specific Gravity, Urine: 1.013 (ref 1.005–1.030)
pH: 6 (ref 5.0–8.0)

## 2023-07-08 LAB — I-STAT CG4 LACTIC ACID, ED
Lactic Acid, Venous: 1.3 mmol/L (ref 0.5–1.9)
Lactic Acid, Venous: 3.4 mmol/L (ref 0.5–1.9)

## 2023-07-08 LAB — T4, FREE: Free T4: 0.98 ng/dL (ref 0.61–1.12)

## 2023-07-08 LAB — RAPID URINE DRUG SCREEN, HOSP PERFORMED
Amphetamines: NOT DETECTED
Barbiturates: NOT DETECTED
Benzodiazepines: NOT DETECTED
Cocaine: NOT DETECTED
Opiates: NOT DETECTED
Tetrahydrocannabinol: NOT DETECTED

## 2023-07-08 LAB — PROTIME-INR
INR: 1.1 (ref 0.8–1.2)
Prothrombin Time: 14 s (ref 11.4–15.2)

## 2023-07-08 LAB — APTT: aPTT: 27 s (ref 24–36)

## 2023-07-08 LAB — MAGNESIUM: Magnesium: 1 mg/dL — ABNORMAL LOW (ref 1.7–2.4)

## 2023-07-08 MED ORDER — MAGNESIUM SULFATE 2 GM/50ML IV SOLN
2.0000 g | Freq: Once | INTRAVENOUS | Status: AC
  Administered 2023-07-08: 2 g via INTRAVENOUS
  Filled 2023-07-08: qty 50

## 2023-07-08 MED ORDER — PROPOFOL 1000 MG/100ML IV EMUL
0.0000 ug/kg/min | INTRAVENOUS | Status: DC
  Administered 2023-07-08: 20 ug/kg/min via INTRAVENOUS
  Administered 2023-07-09 (×2): 30 ug/kg/min via INTRAVENOUS
  Administered 2023-07-09: 15 ug/kg/min via INTRAVENOUS
  Administered 2023-07-10 (×3): 30 ug/kg/min via INTRAVENOUS
  Filled 2023-07-08 (×6): qty 100

## 2023-07-08 MED ORDER — LACTATED RINGERS IV BOLUS
1000.0000 mL | Freq: Once | INTRAVENOUS | Status: AC
  Administered 2023-07-08: 1000 mL via INTRAVENOUS

## 2023-07-08 MED ORDER — ETOMIDATE 2 MG/ML IV SOLN
INTRAVENOUS | Status: AC | PRN
  Administered 2023-07-08: 20 mg via INTRAVENOUS

## 2023-07-08 MED ORDER — ROCURONIUM BROMIDE 10 MG/ML (PF) SYRINGE
PREFILLED_SYRINGE | INTRAVENOUS | Status: AC | PRN
  Administered 2023-07-08: 80 mg via INTRAVENOUS

## 2023-07-08 MED ORDER — SODIUM CHLORIDE 0.9 % IV SOLN
1.0000 g | Freq: Three times a day (TID) | INTRAVENOUS | Status: DC
Start: 2023-07-08 — End: 2023-07-09
  Filled 2023-07-08 (×3): qty 5

## 2023-07-08 MED ORDER — DEXTROSE 10 % IV SOLN: INTRAVENOUS | Status: DC

## 2023-07-08 MED ORDER — IOHEXOL 350 MG/ML SOLN
75.0000 mL | Freq: Once | INTRAVENOUS | Status: AC | PRN
  Administered 2023-07-08: 75 mL via INTRAVENOUS

## 2023-07-08 MED ORDER — DEXTROSE 50 % IV SOLN
INTRAVENOUS | Status: AC
  Filled 2023-07-08: qty 50

## 2023-07-08 MED ORDER — VANCOMYCIN HCL 1750 MG/350ML IV SOLN
1750.0000 mg | Freq: Once | INTRAVENOUS | Status: AC
  Administered 2023-07-08: 1750 mg via INTRAVENOUS
  Filled 2023-07-08: qty 350

## 2023-07-08 MED ORDER — ACETAMINOPHEN 650 MG RE SUPP
650.0000 mg | Freq: Once | RECTAL | Status: AC
Start: 2023-07-08 — End: 2023-07-08
  Administered 2023-07-08: 650 mg via RECTAL
  Filled 2023-07-08: qty 1

## 2023-07-08 MED ORDER — DEXTROSE 50 % IV SOLN
1.0000 | Freq: Once | INTRAVENOUS | Status: AC
  Administered 2023-07-08: 50 mL via INTRAVENOUS

## 2023-07-08 NOTE — Sepsis Progress Note (Signed)
Secure chat sent to provider questioning if pt still met criteria for sepsis and the need to order antibiotics. Due to patient's history/allergy pharmacy was assisting with abx given her hx of anaphylaxis to penicillins as per secure chat.

## 2023-07-08 NOTE — ED Provider Notes (Signed)
ATTENDING SUPERVISORY NOTE I have personally viewed the imaging studies performed. I have personally seen and examined the patient, and discussed the plan of care with the resident.  I have reviewed the documentation of the resident and agree.  Hypoglycemia  Acute encephalopathy  Fever in adult  Critical Care E&M  Performed by: Blane Ohara, MD Critical care provider statement:    Critical care time (minutes):  75   Critical care start time:  07/08/2023 10:00 PM   Critical care end time:  07/08/2023 11:15 PM   Critical care time was exclusive of:  Separately billable procedures and treating other patients and teaching time   Critical care was necessary to treat or prevent imminent or life-threatening deterioration of the following conditions:  Respiratory failure   Critical care was time spent personally by me on the following activities:  Re-evaluation of patient's condition, pulse oximetry, ordering and review of radiographic studies, ordering and review of laboratory studies, ordering and performing treatments and interventions, evaluation of patient's response to treatment and discussions with consultants After initial E/M assessment, critical care services were subsequently performed that were exclusive of separately billable procedures or treatment.   Marland KitchenUltrasound ED Peripheral IV (Provider)  Date/Time: 07/08/2023 11:16 PM  Performed by: Blane Ohara, MD Authorized by: Blane Ohara, MD   Procedure details:    Indications: multiple failed IV attempts     Location:  Left AC   Angiocath:  20 G   Bedside Ultrasound Guided: Yes     Images: archived     Patient tolerated procedure without complications: Yes    Hypoglycemia  Acute encephalopathy  Fever in adult     Blane Ohara, MD 07/10/23 1559

## 2023-07-08 NOTE — ED Provider Notes (Incomplete)
[place] EMERGENCY DEPARTMENT AT [facility] Provider Note  MDM   HPI/ROS:  Rhonda Conrad is a 89 y.o. female with pertinent past medical history of hypertension, diabetes, hyperlipidemia, and chronic kidney disease  who presents for decreased responsiveness.  Per EMS, patient reportedly had a fall sometime within the past day.  At that time she was assisted back into bed however over the past 15 hours family has been unable to wake her despite trying every 30 minutes.  They ultimately called EMS for transport to the ED.  EMS reports that she was tachycardic and hypoglycemic en route with improvement only to the 70s.  Patient unable to provide additional history.   Physical exam is notable for: - Pupils 2 mm equal round and reactive to light though with irregular pupil on the left -- Roving eye movements -- Occasionally making incomprehensible  sounds -- Occasionally minimally withdrawing in upper extremities, minimal response in bilateral lower extremities -- Small area of ecchymosis over the chest wall  On my initial evaluation, patient is:  -Febrile, tachypneic, hypertensive but not tachycardic and with normal SpO2 on room air.  End-tidal CO2 monitoring -Additional history obtained from EMS, patient's granddaughter  Differential diagnosis for patient's presentation of altered mental status, most likely diagnosis includes delerium 2/2 infectious etiology (UTI/CAP/URI) vs metabolic abnormality (Na/K/Mg/Ca) vs trauma  (ICH).  Patient's shortness of breath), hypercarbia, toxidrome, nonspecific etiology. Other diagnoses were considered including (but not limited to) CVA, intracranial mass, critical dehydration, heptatic dysfunction, uremia, intoxication, endrocrine abnormality, toxidrome. These are considered less likely due to history of present illness and physical exam findings.    Initial Plan:  CTH to evaluate for intracranial etiology of patient's symptoms  Screening labs  including CBC and Metabolic panel to evaluate for infectious or metabolic etiology of disease.  Will obtain full sepsis workup including blood cultures, lactic acid given patient febrile, however given reported inciting event of fall last night, greater concern for primary CNS etiology of patient's symptoms such as intracranial hemorrhage so given prior anaphylaxis to penicillins and no documented history of having received cephalosporins successfully in the past will defer empiric antibiotics pending initial results of workup Urinalysis with reflex culture ordered to evaluate for UTI or relevant urologic/nephrologic pathology.  CXR to evaluate for structural/infectious intrathoracic pathology.  TSH for evaluation for endrocrine etiology *Drug screen for toxidrome evaluation VBG for acid/base status and further toxidrome evaulation EKG to evaluate for cardiac pathology Objective evaluation as below reviewed   Initial Study Results:   Laboratory  All laboratory results reviewed without evidence of clinically relevant pathology.   Exceptions include: WBC 15.7, platelets 85 (previously 109), AST 46, chloride 96, CO2 23, AG 16, magnesium 1.0 VBG with pH 7.49, pCO2 35.6  EKG EKG was reviewed independently. Rate, rhythm, axis, intervals all examined and without medically relevant abnormality. ST segments without concerns for elevations.    Radiology:  All images reviewed independently. ***Agree with radiology report at this time.      Consults: Case discussed with ***.   Final Assessment and Plan:   ***    This patient's current presentation, including their history and physical exam, is most consistent with ***. Differentials include ***.     Interpretations, interventions, and the patient's course of care are documented below.    -   Disposition:  {ED Dispo:29898}  Clinical Impression: No diagnosis found.  Rx / DC Orders ED Discharge Orders     None       The  plan for  this patient was discussed with Dr. ***, who voiced agreement and who oversaw evaluation and treatment of this patient.   Clinical Complexity A medically appropriate history, review of systems, and physical exam was performed.  My independent interpretations of EKG, labs, and radiology are documented in the ED course above.   If decision rules were used in this patient's evaluation, they are listed below.  *** Click here for ABCD2, HEART and other calculatorsREFRESH Note before signing   Patient's presentation is most consistent with {EM COPA:27473}  Medical Decision Making Amount and/or Complexity of Data Reviewed Labs: ordered. Radiology: ordered.  Risk OTC drugs. Prescription drug management. Decision regarding hospitalization.    HPI/ROS      See MDM section for pertinent HPI and ROS. A complete ROS was performed with pertinent positives/negatives noted above.   Past Medical History:  Diagnosis Date  . Anxiety   . Arthritis   . Diabetes mellitus without complication (HCC)   . Headache   . High cholesterol   . Hypertension     Past Surgical History:  Procedure Laterality Date  . AXILLARY LYMPH NODE BIOPSY Left 01/28/2017   Procedure: LEFT AXILLARY LYMPH NODE BIOPSY;  Surgeon: Griselda Miner, MD;  Location: [facility] OR;  Service: General;  Laterality: Left;  RNFA  . BRONCHIAL BIOPSY  05/06/2023   Procedure: BRONCHIAL BIOPSIES;  Surgeon: Leslye Peer, MD;  Location: [facility] ENDOSCOPY;  Service: Pulmonary;;  . BRONCHIAL BRUSHINGS  05/06/2023   Procedure: BRONCHIAL BRUSHINGS;  Surgeon: Leslye Peer, MD;  Location: [facility] ENDOSCOPY;  Service: Pulmonary;;  . BRONCHIAL NEEDLE ASPIRATION BIOPSY  05/06/2023   Procedure: BRONCHIAL NEEDLE ASPIRATION BIOPSIES;  Surgeon: Leslye Peer, MD;  Location: [facility] ENDOSCOPY;  Service: Pulmonary;;  . CARDIAC CATHETERIZATION    . HEMOSTASIS CONTROL  05/06/2023   Procedure: HEMOSTASIS CONTROL;  Surgeon: Leslye Peer, MD;  Location: [facility] ENDOSCOPY;   Service: Pulmonary;;  . LEFT HEART CATHETERIZATION WITH CORONARY ANGIOGRAM N/A 06/15/2013   Procedure: LEFT HEART CATHETERIZATION WITH CORONARY ANGIOGRAM;  Surgeon: Robynn Pane, MD;  Location: [facility] CATH LAB;  Service: Cardiovascular;  Laterality: N/A;  . vaginal polyp removal        Physical Exam   Vitals:   07/08/23 2054  BP: (!) 167/93  Pulse: 84  Resp: (!) 25  Temp: (!) 101.2 F (38.4 C)  TempSrc: Rectal  SpO2: 98%    Physical Exam Gen: NAD. Appears comfortable HENT: Conjunctiva clear, PERRL, EOMI. MMM.  CV: RRR. No M/R/G Pulm: Lungs CTAB with no wheezing, rales, or rhonchi.  GI: Abdomen soft, non-tender, non-distended. Normal bowel sounds in all 4 quadrants. MSK/Skin: No lower extremity edema. Extremities warm, well-perfused with 2+ pulses in all 4 extremities. Neuro: A&Ox3. GCS 15. Moves all extremities.     Procedures   If procedures were preformed on this patient, they are listed below:  Procedure Name: Intubation Date/Time: 07/08/2023 11:46 PM  Performed by: Mikeal Hawthorne, MDPre-anesthesia Checklist: Patient identified, Emergency Drugs available, Patient being monitored, Timeout performed and Suction available Oxygen Delivery Method: Ambu bag Preoxygenation: Pre-oxygenation with 100% oxygen Induction Type: Rapid sequence Ventilation: Mask ventilation without difficulty Laryngoscope Size: Glidescope Grade View: Grade I Tube size: 7.5 mm Number of attempts: 1 Airway Equipment and Method: Rigid stylet Placement Confirmation: ETT inserted through vocal cords under direct vision, Breath sounds checked- equal and bilateral and Positive ETCO2 Secured at: 22 cm Tube secured with: ETT holder Future Recommendations: Recommend- induction with short-acting agent, and alternative techniques  readily available       Mikeal Hawthorne, MD Emergency Medicine PGY-2   Please note that this documentation was produced with the assistance of voice-to-text technology and  may contain errors.

## 2023-07-08 NOTE — Sepsis Progress Note (Signed)
 Elink monitoring for the code sepsis protocol.

## 2023-07-08 NOTE — ED Notes (Signed)
1st lactic within normal limits, 2nd can be canceled id dr allows

## 2023-07-08 NOTE — Progress Notes (Incomplete)
Pharmacy Antibiotic Note  Rhonda Conrad is a 89 y.o. female admitted on 07/08/2023 with sepsis.  Pharmacy has been consulted for aztreonam and vancomycin dosing.  Plan: Aztreonam 1g q8h Vancomycin 1750mg  load  Height: 5\' 7"  (170.2 cm) IBW/kg (Calculated) : 61.6  Temp (24hrs), Avg:101.2 F (38.4 C), Min:101.2 F (38.4 C), Max:101.2 F (38.4 C)  Recent Labs  Lab 07/08/23 2110 07/08/23 2115  WBC 15.7*  --   CREATININE 0.87  --   LATICACIDVEN  --  1.3    CrCl cannot be calculated (Unknown ideal weight.).    Allergies  Allergen Reactions   Penicillins Anaphylaxis, Hives, Rash and Other (See Comments)    Has patient had a PCN reaction causing immediate rash, facial/tongue/throat swelling, SOB or lightheadedness with hypotension: Yes Has patient had a PCN reaction causing severe rash involving mucus membranes or skin necrosis: Yes Has patient had a PCN reaction that required hospitalization No Has patient had a PCN reaction occurring within the last 10 years: Yes If all of the above answers are "NO", then may proceed with Cephalosporin use.     Antimicrobials this admission: *** *** >> *** *** *** >> ***  Dose adjustments this admission: ***  Microbiology results: *** BCx: *** *** UCx: ***  *** Sputum: ***  *** MRSA PCR: ***  Thank you for allowing pharmacy to be a part of this patient's care.  Marja Kays 07/08/2023 11:27 PM

## 2023-07-08 NOTE — ED Notes (Signed)
50 dextrose given IO at this time.

## 2023-07-08 NOTE — ED Notes (Signed)
CBG 45.  

## 2023-07-08 NOTE — ED Triage Notes (Signed)
Patient BIB [facility] from home. Per EMS patient family states patient had fall last night and has been unresponsive since, EMS states 15 hours at minimum. Patient not responding to pain and eyes moving symmetrical L to R. EMS received a CBG to which 25 of dextrose was given bumping to 74 then dropping again to which another 25 of dextrose was given. Patient breathing on own at this time.

## 2023-07-08 NOTE — ED Provider Notes (Incomplete)
[place] EMERGENCY DEPARTMENT AT [facility] Provider Note  MDM   HPI/ROS:  Rhonda Conrad is a 89 y.o. female with pertinent past medical history of hypertension, diabetes, hyperlipidemia, and chronic kidney disease  who presents for decreased responsiveness.  Per EMS, patient reportedly had a fall sometime within the past day.  At that time she was assisted back into bed however over the past 15 hours family has been unable to wake her despite trying every 30 minutes.  They ultimately called EMS for transport to the ED.  EMS reports that she was tachycardic and hypoglycemic en route with improvement only to the 70s.  Patient unable to provide additional history.   Physical exam is notable for: - Pupils 2 mm equal round and reactive to light though with irregular pupil on the left -- Roving eye movements -- Occasionally making incomprehensible  sounds -- Occasionally minimally withdrawing in upper extremities, minimal response in bilateral lower extremities -- Small area of ecchymosis over the chest wall  On my initial evaluation, patient is:  -Febrile, tachypneic, hypertensive but not tachycardic and with normal SpO2 on room air.  End-tidal CO2 monitoring -Additional history obtained from EMS, patient's granddaughter -Initial blood glucose 45  Differential diagnosis for patient's presentation of altered mental status, most likely diagnosis includes delerium 2/2 infectious etiology (UTI/CAP/URI) vs metabolic abnormality (Na/K/Mg/Ca/hypoglycemia) vs trauma  (ICH).  Patient's shortness of breath), hypercarbia, toxidrome, nonspecific etiology. Other diagnoses were considered including (but not limited to) CVA, intracranial mass, critical dehydration, heptatic dysfunction, uremia, intoxication, endrocrine abnormality, toxidrome.  Also considered seizure, though felt less likely given overall moving I have movements without gaze deviation and noted reported seizure-like activity  including tonic-clonic movements.  These are considered less likely due to history of present illness and physical exam findings.    Initial Plan:  CTH to evaluate for intracranial etiology of patient's symptoms  Patient given additional amp of dextrose with only slight improvement of blood glucose so D10 infusion initiated Screening labs including CBC and Metabolic panel to evaluate for infectious or metabolic etiology of disease.  Will obtain full sepsis workup including blood cultures, lactic acid given patient febrile, however given reported inciting event of fall last night, greater concern for primary CNS etiology of patient's symptoms such as intracranial hemorrhage so given prior anaphylaxis to penicillins and no documented history of having received cephalosporins successfully in the past will defer empiric antibiotics pending initial results of workup Urinalysis with reflex culture ordered to evaluate for UTI or relevant urologic/nephrologic pathology.  CXR to evaluate for structural/infectious intrathoracic pathology.  TSH for evaluation for endrocrine etiology Drug screen for toxidrome evaluation VBG for acid/base status and further toxidrome evaulation EKG to evaluate for cardiac pathology Objective evaluation as below reviewed   Initial Study Results:   Laboratory  All laboratory results reviewed without evidence of clinically relevant pathology.   Exceptions include: WBC 15.7, platelets 85 (previously 109), AST 46, chloride 96, CO2 23, AG 16, magnesium 1.0 VBG with pH 7.49, pCO2 35.6  EKG EKG was reviewed independently. Rate 71, normal sinus rhythm, right bundle branch block now present.  Prolonged QTc of 507.  Diffuse ST depressions present most prominent in inferior and lateral leads  Radiology:  Chest x-ray images reviewed independently. Agree with radiology report at this time.     Interval developments, final Assessment and Plan:   -Patient reevaluated by myself  multiple times.  Initially deferred securing airway given normal vital signs and an event hypoglycemia was primary  cause of encephalopathy, however although maintaining normal SpO2 on room air, concern for declining GCS with what appears to be new extensor posturing.  Spoke with patient's granddaughter at bedside as well as patient's daughter Marchelle Folks over the phone.  Do not feel patient is adequately protecting her airway given mental status and after confirming CODE STATUS with patient's family she was intubated for airway protection.  CT head obtained and demonstrates large extra-axial mass consistent with meningioma, however no acute intracranial abnormality.  After discussing with intensivist team and pharmacy broad-spectrum antibiotics including aztreonam and vancomycin were ordered.  Repeat labs noted for newly elevated lactic acid of 3.4, previously was within normal limits.   Disposition:  I discussed the case with the ICU service who graciously agreed to admit the patient to their service for continued care.  Please see their note for further treatment plan details  Clinical Impression:  1. Hypoglycemia   2. Acute encephalopathy   3. Fever in adult     Rx / DC Orders ED Discharge Orders     None       The plan for this patient was discussed with Dr. Jodi Mourning, who voiced agreement and who oversaw evaluation and treatment of this patient.   Clinical Complexity A medically appropriate history, review of systems, and physical exam was performed.  My independent interpretations of EKG, labs, and radiology are documented in the ED course above.   If decision rules were used in this patient's evaluation, they are listed below.   Patient's presentation is most consistent with acute presentation with potential threat to life or bodily function.  Medical Decision Making Amount and/or Complexity of Data Reviewed Labs: ordered. Radiology: ordered.  Risk OTC drugs. Prescription drug  management. Decision regarding hospitalization.    HPI/ROS      See MDM section for pertinent HPI and ROS. A complete ROS was performed with pertinent positives/negatives noted above.   Past Medical History:  Diagnosis Date   Anxiety    Arthritis    Diabetes mellitus without complication (HCC)    Headache    High cholesterol    Hypertension     Past Surgical History:  Procedure Laterality Date   AXILLARY LYMPH NODE BIOPSY Left 01/28/2017   Procedure: LEFT AXILLARY LYMPH NODE BIOPSY;  Surgeon: Griselda Miner, MD;  Location: [facility] OR;  Service: General;  Laterality: Left;  RNFA   BRONCHIAL BIOPSY  05/06/2023   Procedure: BRONCHIAL BIOPSIES;  Surgeon: Leslye Peer, MD;  Location: [facility] ENDOSCOPY;  Service: Pulmonary;;   BRONCHIAL BRUSHINGS  05/06/2023   Procedure: BRONCHIAL BRUSHINGS;  Surgeon: Leslye Peer, MD;  Location: [facility] ENDOSCOPY;  Service: Pulmonary;;   BRONCHIAL NEEDLE ASPIRATION BIOPSY  05/06/2023   Procedure: BRONCHIAL NEEDLE ASPIRATION BIOPSIES;  Surgeon: Leslye Peer, MD;  Location: [facility] ENDOSCOPY;  Service: Pulmonary;;   CARDIAC CATHETERIZATION     HEMOSTASIS CONTROL  05/06/2023   Procedure: HEMOSTASIS CONTROL;  Surgeon: Leslye Peer, MD;  Location: [facility] ENDOSCOPY;  Service: Pulmonary;;   LEFT HEART CATHETERIZATION WITH CORONARY ANGIOGRAM N/A 06/15/2013   Procedure: LEFT HEART CATHETERIZATION WITH CORONARY ANGIOGRAM;  Surgeon: Robynn Pane, MD;  Location: [facility] CATH LAB;  Service: Cardiovascular;  Laterality: N/A;   vaginal polyp removal        Physical Exam   Vitals:   07/08/23 2054  BP: (!) 167/93  Pulse: 84  Resp: (!) 25  Temp: (!) 101.2 F (38.4 C)  TempSrc: Rectal  SpO2: 98%    Physical  Exam Gen: Lethargic, sonorous respirations, holding mouth open HENT: Conjunctiva clear.  Pupils 2 mm round and reactive to light bilaterally, left pupil irregular in shape CV: RRR. No M/R/G Pulm: Tachypneic.  Lungs CTAB with no wheezing, rales, or rhonchi.  GI: Abdomen  soft, non-distended. Normal bowel sounds in all 4 quadrants. MSK/Skin: No lower extremity edema. Extremities warm, well-perfused with 2+ pulses in all 4 extremities. Neuro: GCS 7.  Slight withdrawal to pain in extremities.  Roving eye movements.    Procedures   If procedures were preformed on this patient, they are listed below:  Procedure Name: Intubation Date/Time: 07/08/2023 11:46 PM  Performed by: Mikeal Hawthorne, MDPre-anesthesia Checklist: Patient identified, Emergency Drugs available, Patient being monitored, Timeout performed and Suction available Oxygen Delivery Method: Ambu bag Preoxygenation: Pre-oxygenation with 100% oxygen Induction Type: Rapid sequence Ventilation: Mask ventilation without difficulty Laryngoscope Size: Glidescope Grade View: Grade I Tube size: 7.5 mm Number of attempts: 1 Airway Equipment and Method: Rigid stylet Placement Confirmation: ETT inserted through vocal cords under direct vision, Breath sounds checked- equal and bilateral and Positive ETCO2 Secured at: 22 cm Tube secured with: ETT holder Future Recommendations: Recommend- induction with short-acting agent, and alternative techniques readily available       Mikeal Hawthorne, MD Emergency Medicine PGY-2   Please note that this documentation was produced with the assistance of voice-to-text technology and may contain errors.    Mikeal Hawthorne, MD 07/09/23 [phone]

## 2023-07-09 ENCOUNTER — Inpatient Hospital Stay (HOSPITAL_COMMUNITY): Payer: 59

## 2023-07-09 ENCOUNTER — Encounter (HOSPITAL_COMMUNITY): Payer: Self-pay | Admitting: Critical Care Medicine

## 2023-07-09 DIAGNOSIS — J690 Pneumonitis due to inhalation of food and vomit: Secondary | ICD-10-CM

## 2023-07-09 DIAGNOSIS — J9601 Acute respiratory failure with hypoxia: Secondary | ICD-10-CM

## 2023-07-09 DIAGNOSIS — E1165 Type 2 diabetes mellitus with hyperglycemia: Secondary | ICD-10-CM | POA: Diagnosis not present

## 2023-07-09 DIAGNOSIS — R17 Unspecified jaundice: Secondary | ICD-10-CM | POA: Diagnosis present

## 2023-07-09 DIAGNOSIS — R220 Localized swelling, mass and lump, head: Secondary | ICD-10-CM | POA: Diagnosis not present

## 2023-07-09 DIAGNOSIS — D329 Benign neoplasm of meninges, unspecified: Secondary | ICD-10-CM | POA: Diagnosis present

## 2023-07-09 DIAGNOSIS — Z794 Long term (current) use of insulin: Secondary | ICD-10-CM | POA: Diagnosis not present

## 2023-07-09 DIAGNOSIS — R932 Abnormal findings on diagnostic imaging of liver and biliary tract: Secondary | ICD-10-CM | POA: Diagnosis not present

## 2023-07-09 DIAGNOSIS — E11649 Type 2 diabetes mellitus with hypoglycemia without coma: Secondary | ICD-10-CM | POA: Diagnosis present

## 2023-07-09 DIAGNOSIS — D696 Thrombocytopenia, unspecified: Secondary | ICD-10-CM | POA: Diagnosis present

## 2023-07-09 DIAGNOSIS — C3412 Malignant neoplasm of upper lobe, left bronchus or lung: Secondary | ICD-10-CM | POA: Diagnosis present

## 2023-07-09 DIAGNOSIS — R569 Unspecified convulsions: Secondary | ICD-10-CM

## 2023-07-09 DIAGNOSIS — E119 Type 2 diabetes mellitus without complications: Secondary | ICD-10-CM

## 2023-07-09 DIAGNOSIS — N1831 Chronic kidney disease, stage 3a: Secondary | ICD-10-CM | POA: Diagnosis present

## 2023-07-09 DIAGNOSIS — Z515 Encounter for palliative care: Secondary | ICD-10-CM | POA: Diagnosis not present

## 2023-07-09 DIAGNOSIS — G9340 Encephalopathy, unspecified: Secondary | ICD-10-CM | POA: Diagnosis not present

## 2023-07-09 DIAGNOSIS — G9341 Metabolic encephalopathy: Secondary | ICD-10-CM | POA: Diagnosis present

## 2023-07-09 DIAGNOSIS — E162 Hypoglycemia, unspecified: Principal | ICD-10-CM

## 2023-07-09 DIAGNOSIS — Z880 Allergy status to penicillin: Secondary | ICD-10-CM | POA: Diagnosis not present

## 2023-07-09 DIAGNOSIS — Z87891 Personal history of nicotine dependence: Secondary | ICD-10-CM | POA: Diagnosis not present

## 2023-07-09 DIAGNOSIS — I129 Hypertensive chronic kidney disease with stage 1 through stage 4 chronic kidney disease, or unspecified chronic kidney disease: Secondary | ICD-10-CM | POA: Diagnosis present

## 2023-07-09 DIAGNOSIS — E1122 Type 2 diabetes mellitus with diabetic chronic kidney disease: Secondary | ICD-10-CM | POA: Diagnosis present

## 2023-07-09 DIAGNOSIS — Z7982 Long term (current) use of aspirin: Secondary | ICD-10-CM | POA: Diagnosis not present

## 2023-07-09 DIAGNOSIS — Z79899 Other long term (current) drug therapy: Secondary | ICD-10-CM | POA: Diagnosis not present

## 2023-07-09 DIAGNOSIS — Z66 Do not resuscitate: Secondary | ICD-10-CM | POA: Diagnosis not present

## 2023-07-09 DIAGNOSIS — I10 Essential (primary) hypertension: Secondary | ICD-10-CM

## 2023-07-09 DIAGNOSIS — G931 Anoxic brain damage, not elsewhere classified: Secondary | ICD-10-CM | POA: Diagnosis present

## 2023-07-09 DIAGNOSIS — Z7189 Other specified counseling: Secondary | ICD-10-CM | POA: Diagnosis not present

## 2023-07-09 DIAGNOSIS — I6782 Cerebral ischemia: Secondary | ICD-10-CM | POA: Diagnosis not present

## 2023-07-09 DIAGNOSIS — E7800 Pure hypercholesterolemia, unspecified: Secondary | ICD-10-CM | POA: Diagnosis present

## 2023-07-09 DIAGNOSIS — Z1152 Encounter for screening for COVID-19: Secondary | ICD-10-CM | POA: Diagnosis not present

## 2023-07-09 LAB — POCT I-STAT 7, (LYTES, BLD GAS, ICA,H+H)
Acid-Base Excess: 0 mmol/L (ref 0.0–2.0)
Acid-Base Excess: 0 mmol/L (ref 0.0–2.0)
Bicarbonate: 19.7 mmol/L — ABNORMAL LOW (ref 20.0–28.0)
Bicarbonate: 22.9 mmol/L (ref 20.0–28.0)
Calcium, Ion: 1.09 mmol/L — ABNORMAL LOW (ref 1.15–1.40)
Calcium, Ion: 1.11 mmol/L — ABNORMAL LOW (ref 1.15–1.40)
HCT: 41 % (ref 36.0–46.0)
HCT: 34 % — ABNORMAL LOW (ref 36.0–46.0)
Hemoglobin: 13.9 g/dL (ref 12.0–15.0)
Hemoglobin: 11.6 g/dL — ABNORMAL LOW (ref 12.0–15.0)
O2 Saturation: 100 %
O2 Saturation: 100 %
Patient temperature: 98.2
Patient temperature: 99.6
Potassium: 3.7 mmol/L (ref 3.5–5.1)
Potassium: 3.2 mmol/L — ABNORMAL LOW (ref 3.5–5.1)
Sodium: 128 mmol/L — ABNORMAL LOW (ref 135–145)
Sodium: 133 mmol/L — ABNORMAL LOW (ref 135–145)
TCO2: 20 mmol/L — ABNORMAL LOW (ref 22–32)
TCO2: 24 mmol/L (ref 22–32)
pCO2 arterial: 21.1 mm[Hg] — ABNORMAL LOW (ref 32–48)
pCO2 arterial: 31.1 mm[Hg] — ABNORMAL LOW (ref 32–48)
pH, Arterial: 7.579 — ABNORMAL HIGH (ref 7.35–7.45)
pH, Arterial: 7.477 — ABNORMAL HIGH (ref 7.35–7.45)
pO2, Arterial: 159 mm[Hg] — ABNORMAL HIGH (ref 83–108)
pO2, Arterial: 176 mm[Hg] — ABNORMAL HIGH (ref 83–108)

## 2023-07-09 LAB — HEMOGLOBIN A1C
Hgb A1c MFr Bld: 7.9 % — ABNORMAL HIGH (ref 4.8–5.6)
Mean Plasma Glucose: 180.03 mg/dL

## 2023-07-09 LAB — CBC
HCT: 41.9 % (ref 36.0–46.0)
Hemoglobin: 14.1 g/dL (ref 12.0–15.0)
MCH: 30.1 pg (ref 26.0–34.0)
MCHC: 33.7 g/dL (ref 30.0–36.0)
MCV: 89.5 fL (ref 80.0–100.0)
Platelets: 196 10*3/uL (ref 150–400)
RBC: 4.68 MIL/uL (ref 3.87–5.11)
RDW: 14.9 % (ref 11.5–15.5)
WBC: 17.5 10*3/uL — ABNORMAL HIGH (ref 4.0–10.5)
nRBC: 0 % (ref 0.0–0.2)

## 2023-07-09 LAB — BASIC METABOLIC PANEL
Anion gap: 16 — ABNORMAL HIGH (ref 5–15)
BUN: 11 mg/dL (ref 8–23)
CO2: 22 mmol/L (ref 22–32)
Calcium: 7.7 mg/dL — ABNORMAL LOW (ref 8.9–10.3)
Chloride: 92 mmol/L — ABNORMAL LOW (ref 98–111)
Creatinine, Ser: 1.04 mg/dL — ABNORMAL HIGH (ref 0.44–1.00)
GFR, Estimated: 51 mL/min — ABNORMAL LOW (ref >60)
Glucose, Bld: 264 mg/dL — ABNORMAL HIGH (ref 70–99)
Potassium: 4.1 mmol/L (ref 3.5–5.1)
Sodium: 130 mmol/L — ABNORMAL LOW (ref 135–145)

## 2023-07-09 LAB — GLUCOSE, CAPILLARY
Glucose-Capillary: 144 mg/dL — ABNORMAL HIGH (ref 70–99)
Glucose-Capillary: 249 mg/dL — ABNORMAL HIGH (ref 70–99)
Glucose-Capillary: 252 mg/dL — ABNORMAL HIGH (ref 70–99)
Glucose-Capillary: 189 mg/dL — ABNORMAL HIGH (ref 70–99)
Glucose-Capillary: 153 mg/dL — ABNORMAL HIGH (ref 70–99)
Glucose-Capillary: 87 mg/dL (ref 70–99)
Glucose-Capillary: 127 mg/dL — ABNORMAL HIGH (ref 70–99)

## 2023-07-09 LAB — URINALYSIS, W/ REFLEX TO CULTURE (INFECTION SUSPECTED)
Bacteria, UA: NONE SEEN
Bilirubin Urine: NEGATIVE
Glucose, UA: 50 mg/dL — AB
Ketones, ur: NEGATIVE mg/dL
Leukocytes,Ua: NEGATIVE
Nitrite: NEGATIVE
Protein, ur: 100 mg/dL — AB
Specific Gravity, Urine: >1.046 — ABNORMAL HIGH (ref 1.005–1.030)
pH: 5 (ref 5.0–8.0)

## 2023-07-09 LAB — CORTISOL: Cortisol, Plasma: 53.8 ug/dL

## 2023-07-09 LAB — AMMONIA: Ammonia: 10 umol/L (ref 9–35)

## 2023-07-09 LAB — LACTIC ACID, PLASMA
Lactic Acid, Venous: 4.3 mmol/L (ref 0.5–1.9)
Lactic Acid, Venous: 3.2 mmol/L (ref 0.5–1.9)

## 2023-07-09 LAB — CK: Total CK: 380 U/L — ABNORMAL HIGH (ref 38–234)

## 2023-07-09 LAB — MRSA NEXT GEN BY PCR, NASAL: MRSA by PCR Next Gen: NOT DETECTED

## 2023-07-09 LAB — TRIGLYCERIDES: Triglycerides: 78 mg/dL (ref <150)

## 2023-07-09 LAB — MAGNESIUM: Magnesium: 2.5 mg/dL — ABNORMAL HIGH (ref 1.7–2.4)

## 2023-07-09 LAB — PHOSPHORUS: Phosphorus: 3.4 mg/dL (ref 2.5–4.6)

## 2023-07-09 LAB — TSH: TSH: 0.272 u[IU]/mL — ABNORMAL LOW (ref 0.350–4.500)

## 2023-07-09 LAB — PROCALCITONIN: Procalcitonin: 0.23 ng/mL

## 2023-07-09 LAB — CBG MONITORING, ED: Glucose-Capillary: 279 mg/dL — ABNORMAL HIGH (ref 70–99)

## 2023-07-09 MED ORDER — HEPARIN SODIUM (PORCINE) 5000 UNIT/ML IJ SOLN
5000.0000 [IU] | Freq: Three times a day (TID) | INTRAMUSCULAR | Status: DC
  Administered 2023-07-09 – 2023-07-10 (×5): 5000 [IU] via SUBCUTANEOUS
  Filled 2023-07-09 (×5): qty 1

## 2023-07-09 MED ORDER — DOCUSATE SODIUM 100 MG PO CAPS: 100.0000 mg | ORAL_CAPSULE | Freq: Two times a day (BID) | ORAL | Status: DC | PRN

## 2023-07-09 MED ORDER — LACTATED RINGERS IV BOLUS
1000.0000 mL | Freq: Once | INTRAVENOUS | Status: AC
Start: 2023-07-09 — End: 2023-07-09
  Administered 2023-07-09: 1000 mL via INTRAVENOUS

## 2023-07-09 MED ORDER — NOREPINEPHRINE 4 MG/250ML-% IV SOLN
1.0000 ug/min | INTRAVENOUS | Status: DC
  Administered 2023-07-09: 2 ug/min via INTRAVENOUS
  Filled 2023-07-09: qty 250

## 2023-07-09 MED ORDER — SODIUM CHLORIDE 0.9 % IV SOLN
250.0000 mL | INTRAVENOUS | Status: DC
  Administered 2023-07-09: 250 mL via INTRAVENOUS

## 2023-07-09 MED ORDER — INSULIN ASPART 100 UNIT/ML IJ SOLN
0.0000 [IU] | INTRAMUSCULAR | Status: DC
  Administered 2023-07-09: 2 [IU] via SUBCUTANEOUS
  Administered 2023-07-09 – 2023-07-10 (×2): 3 [IU] via SUBCUTANEOUS
  Administered 2023-07-10 (×2): 2 [IU] via SUBCUTANEOUS

## 2023-07-09 MED ORDER — VANCOMYCIN HCL IN DEXTROSE 1-5 GM/200ML-% IV SOLN: 1000.0000 mg | INTRAVENOUS | Status: DC

## 2023-07-09 MED ORDER — OSMOLITE 1.5 CAL PO LIQD
1000.0000 mL | ORAL | Status: DC
Start: 2023-07-09 — End: 2023-07-10
  Administered 2023-07-09: 1000 mL

## 2023-07-09 MED ORDER — SODIUM CHLORIDE 0.9 % IV SOLN
2.0000 g | INTRAVENOUS | Status: DC
  Administered 2023-07-09 – 2023-07-10 (×2): 2 g via INTRAVENOUS
  Filled 2023-07-09 (×2): qty 20

## 2023-07-09 MED ORDER — CEFEPIME HCL 2 G IV SOLR
2.0000 g | Freq: Two times a day (BID) | INTRAVENOUS | Status: DC
  Administered 2023-07-09: 2 g via INTRAVENOUS
  Filled 2023-07-09: qty 12.5

## 2023-07-09 MED ORDER — LACTATED RINGERS IV BOLUS
2000.0000 mL | Freq: Once | INTRAVENOUS | Status: AC
  Administered 2023-07-09: 2000 mL via INTRAVENOUS

## 2023-07-09 MED ORDER — SODIUM CHLORIDE 0.9 % IV SOLN: 2.0000 g | Freq: Two times a day (BID) | INTRAVENOUS | Status: DC

## 2023-07-09 MED ORDER — CHLORHEXIDINE GLUCONATE CLOTH 2 % EX PADS
6.0000 | MEDICATED_PAD | Freq: Every day | CUTANEOUS | Status: DC
  Administered 2023-07-09 – 2023-07-10 (×2): 6 via TOPICAL

## 2023-07-09 MED ORDER — PROSOURCE TF20 ENFIT COMPATIBL EN LIQD
60.0000 mL | Freq: Every day | ENTERAL | Status: DC
  Administered 2023-07-09 – 2023-07-10 (×2): 60 mL
  Filled 2023-07-09 (×2): qty 60

## 2023-07-09 MED ORDER — ACETAMINOPHEN 160 MG/5ML PO SOLN
650.0000 mg | Freq: Four times a day (QID) | ORAL | Status: DC | PRN
  Administered 2023-07-09: 650 mg
  Filled 2023-07-09: qty 20.3

## 2023-07-09 MED ORDER — NOREPINEPHRINE 4 MG/250ML-% IV SOLN
INTRAVENOUS | Status: AC
  Filled 2023-07-09: qty 250

## 2023-07-09 MED ORDER — DOCUSATE SODIUM 50 MG/5ML PO LIQD: 100.0000 mg | Freq: Two times a day (BID) | ORAL | Status: DC | PRN

## 2023-07-09 MED ORDER — ORAL CARE MOUTH RINSE: 15.0000 mL | OROMUCOSAL | Status: DC | PRN

## 2023-07-09 MED ORDER — POLYETHYLENE GLYCOL 3350 17 G PO PACK: 17.0000 g | PACK | Freq: Every day | ORAL | Status: DC | PRN

## 2023-07-09 MED ORDER — CALCIUM GLUCONATE-NACL 1-0.675 GM/50ML-% IV SOLN
1.0000 g | Freq: Once | INTRAVENOUS | Status: AC
  Administered 2023-07-09: 1000 mg via INTRAVENOUS
  Filled 2023-07-09: qty 50

## 2023-07-09 MED ORDER — ORAL CARE MOUTH RINSE
15.0000 mL | OROMUCOSAL | Status: DC
  Administered 2023-07-09 – 2023-07-10 (×18): 15 mL via OROMUCOSAL

## 2023-07-09 MED ORDER — PANTOPRAZOLE SODIUM 40 MG IV SOLR
40.0000 mg | INTRAVENOUS | Status: DC
  Administered 2023-07-09 – 2023-07-10 (×2): 40 mg via INTRAVENOUS
  Filled 2023-07-09 (×2): qty 10

## 2023-07-09 NOTE — TOC CM/SW Note (Signed)
 Transition of Care [facility]) - Inpatient Brief Assessment   Patient Details  Name: Rhonda Conrad MRN: [id number] Date of Birth: 1931-11-13  Transition of Care [facility]) CM/SW Contact:    Tom-Steffenhagen, Therese Rocco Daphne, RN Phone Number: 07/09/2023, 2:14 PM   Clinical Narrative:  Patient presented to the ED with Altered Mental Status and Unresponsive with concern for Sepsis. Currently Intubated and Sedated, on IV abx.   Patient is from home with family.  No TOC needs or recommendations noted at this time.  Patient not Medically ready for discharge.  CM will continue to follow as patient progresses with care towards discharge.         Transition of Care Asessment: Insurance and Status: Insurance coverage has been reviewed Patient has primary care physician: Yes Home environment has been reviewed: Yes Prior level of function:: Modified Independent   Social Drivers of Health Review: SDOH reviewed no interventions necessary   Transition of care needs: transition of care needs identified, TOC will continue to follow

## 2023-07-09 NOTE — Progress Notes (Incomplete)
Pharmacy Antibiotic Note  Rhonda Conrad is a 89 y.o. female admitted on 07/08/2023 with AMS and unresponsive and now with concern for sepsis.  Given penicillin allergy and unable to clarify further with patient being intubated, will be conservative with aztreonam. Per chart review, appears to have had ceftriaxone at OSH in 2023. Pharmacy has been consulted for aztreonam and vancomycin dosing.  Plan: Aztreonam 1g q8h Vancomycin 1750mg  load  Height: 5\' 7"  (170.2 cm) IBW/kg (Calculated) : 61.6  Temp (24hrs), Avg:101.2 F (38.4 C), Min:101.2 F (38.4 C), Max:101.2 F (38.4 C)  Recent Labs  Lab 07/08/23 2110 07/08/23 2115  WBC 15.7*  --   CREATININE 0.87  --   LATICACIDVEN  --  1.3    CrCl cannot be calculated (Unknown ideal weight.).    Allergies  Allergen Reactions  . Penicillins Anaphylaxis, Hives, Rash and Other (See Comments)    Has patient had a PCN reaction causing immediate rash, facial/tongue/throat swelling, SOB or lightheadedness with hypotension: Yes Has patient had a PCN reaction causing severe rash involving mucus membranes or skin necrosis: Yes Has patient had a PCN reaction that required hospitalization No Has patient had a PCN reaction occurring within the last 10 years: Yes If all of the above answers are "NO", then may proceed with Cephalosporin use.     Antimicrobials this admission: Aztreonam 2/4 > Vancomycin 2/4 >  Dose adjustments this admission: ***  Microbiology results: *** BCx: *** *** UCx: ***  *** Sputum: ***  *** MRSA PCR: ***  Thank you for allowing pharmacy to be a part of this patient's care.  Marja Kays 07/08/2023 11:27 PM

## 2023-07-09 NOTE — Plan of Care (Signed)
  Problem: Metabolic: Goal: Ability to maintain appropriate glucose levels will improve Outcome: Progressing   Problem: Skin Integrity: Goal: Risk for impaired skin integrity will decrease Outcome: Progressing   Problem: Clinical Measurements: Goal: Will remain free from infection Outcome: Progressing Goal: Cardiovascular complication will be avoided Outcome: Progressing   Problem: Elimination: Goal: Will not experience complications related to bowel motility Outcome: Progressing   Problem: Safety: Goal: Ability to remain free from injury will improve Outcome: Progressing

## 2023-07-09 NOTE — Progress Notes (Signed)
 EEG complete - results pending

## 2023-07-09 NOTE — Progress Notes (Signed)
07/09/2023 Seen and examined. EEG to start then MRI if neg  Limit sedation, continue vent  Myrla Halsted MD PCCM

## 2023-07-09 NOTE — Procedures (Signed)
 Patient Name: Rhonda Conrad  MRN: [id number]  Epilepsy Attending: Arlin MALVA Krebs  Referring Physician/Provider: Claudene Toribio BROCKS, MD  Date: 07/09/2023 Duration: 23.35 mins  Patient history: 89yo F with ams. EEG to evaluate for seizure  Level of alertness: comatose/ lethargic   AEDs during EEG study: None  Technical aspects: This EEG study was done with scalp electrodes positioned according to the 10-20 International system of electrode placement. Electrical activity was reviewed with band pass filter of 1-70Hz , sensitivity of 7 uV/mm, display speed of 61mm/sec with a 60Hz  notched filter applied as appropriate. EEG data were recorded continuously and digitally stored.  Video monitoring was available and reviewed as appropriate.  Description: EEG showed continuous generalized polymorphic high amplitude predominantly 2 to 3 Hz delta slowing admixed with intermittent 5 to 6 Hz theta slowing.  Intermittently throughout the study, patient was noted to have subtle head jerking.  Concomitant EEG before, during and after the event did not show any EEG to suggest seizure.  Hyperventilation and photic stimulation were not performed.     ABNORMALITY -Continuous slow, generalized  IMPRESSION: This study is suggestive of severe diffuse encephalopathy.  No seizures or epileptiform discharges were seen throughout the recording.  Intermittently throughout the study, patient was noted to have subtle head jerking without concomitant EEG change.  These were most likely not epileptic events.  [name] [name]

## 2023-07-09 NOTE — Progress Notes (Addendum)
 eLink Physician-Brief Progress Note Patient Name: Rhonda Conrad DOB: [birth year] MRN: [id number]   Date of Service  07/09/2023  HPI/Events of Note  Patient initially presented with hypoglycemia and placed on a dextrose  infusion.  Now hyperglycemic  eICU Interventions  DC dextrose  infusion  Discontinue exercise of blood cultures.  1 set collected within the past 8 hours   0448 -lactic acid 4.3.  Continue to trend at noon  0458 -period of hypotension with propofol  on board.  Patient has seizure-like activity when the propofol  is dialed back.  Received total of 2.5 L IVF so far.  Will initiate peripheral norepinephrine   0528 -being over ventilated.  ABG reviewed, will reduce respiratory rate and transition to 6 cc/kg  Intervention Category Intermediate Interventions: Hyperglycemia - evaluation and treatment  [name] 07/09/2023, 3:40 AM

## 2023-07-09 NOTE — H&P (Signed)
 NAME:  Rhonda Conrad, MRN:  [id number], DOB:  [birth year], LOS: 0 ADMISSION DATE:  07/08/2023, CONSULTATION DATE:  2/4 REFERRING MD:  Dr. Tonia, CHIEF COMPLAINT:  acute encephalopathy   History of Present Illness:  89 year old female with PMH as below, which is significant for DM, lung cancer, and HTN.  Recently seen by Dr Shelah in the pulmonary clinic for lung nodule and underwent bronch/biopsy showing adenocarcinoma. She was referred to rad onc, but had not followed up yet after missing a couple of appointments. She had a PCP appointment not too long ago and was found to have high A1c but insulin  was not increased. Oxycodone  PRN increased for pain.   2/3 0300 her niece whom she lives with heard a thud and found her on the floor. Could not get her back in bed. EMS was called and assisted back into bed, but the patient refused to be evaluated. In the following hours the patient became less responsive with sonorous respirations. When this persisted for 12-15 hours family called EMS for transport to the ED. Upon EMS arrival her blood sugar was found to be in the 50s and she was given dextrose . Blood sugars remained low upon arrival to the ED prompting more dextrose  and ultimately an infusion. Also found to be febrile. Airway protection was marginal and she was intubated prior to getting CT head, which did not show acute pathology. Antibiotics initiated. PCCM asked to admit.   Pertinent  Medical History   has a past medical history of Anxiety, Arthritis, Diabetes mellitus without complication (HCC), Headache, High cholesterol, and Hypertension.   Significant Hospital Events: Including procedures, antibiotic start and stop dates in addition to other pertinent events   2/4 admit with encephalopathy intubated in ED.   Interim History / Subjective:    Objective   Blood pressure 130/82, pulse (!) 107, temperature (!) 101.2 F (38.4 C), temperature source Rectal, resp. rate 20, height 5' 7 (1.702  m), weight 75.8 kg, SpO2 100%.    Vent Mode: PRVC FiO2 (%):  [100 %] 100 % Set Rate:  [20 bmp] 20 bmp Vt Set:  [500 mL] 500 mL PEEP:  [5 cmH20] 5 cmH20 Plateau Pressure:  [18 cmH20-20 cmH20] 20 cmH20   Intake/Output Summary (Last 24 hours) at 07/09/2023 0005 Last data filed at 07/08/2023 2301 Gross per 24 hour  Intake 1049.22 ml  Output --  Net 1049.22 ml   Filed Weights   07/08/23 2303  Weight: 75.8 kg    Examination: General: Elderly appearing female in NAD on vent HENT: Violet/AT. L pupil irregular. R pupil 3 mm and reactive to light.  Lungs: Clear bilateral breath sounds Cardiovascular: RRR, no MRG Abdomen: Soft, ND Extremities: No acute deformity Neuro: Sedated.   Resolved Hospital Problem list     Assessment & Plan:   Acute metabolic encephalopathy:  likely secondary to hypoglycemia. ED staff noted extensor posturing. CT head shows chronic meningioma. Infection and opioids may also be playing a role.  - Admit to ICU - D 10 infusion continue - Empiric antibiotics - EEG - Check ammonia, TSH  Acute respiratory failure with hypoxia Probable aspiration - Full vent support - ABG reviewed and settings adjusted - Intermittent CXR - Cefepime , vancomycin . Low threshold to deescalate to minimize neuro effects from cefepime . PCN allergic but has used cephalosporins per pharmacy.  DM Hypoglycemia - D10 infusion - CBG monitoring q 4 hours.  - Holding SSI for now  Hypocalcemia - replete  HTN - holding  home antihypertensives  Goals of care discussed with the patient's daughter. Full code. Would not want to be on vent long term.   Best Practice (right click and Reselect all SmartList Selections daily)   Diet/type: NPO DVT prophylaxis prophylactic heparin   Pressure ulcer(s): N/A GI prophylaxis: H2B Lines: N/A Foley:  N/A Code Status:  full code Last date of multidisciplinary goals of care discussion [  ]  Labs   CBC: Recent Labs  Lab 07/08/23 2110  07/08/23 2115 07/08/23 2258  WBC 15.7*  --   --   NEUTROABS 13.5*  --   --   HGB 15.9* 17.0* 12.9  HCT 46.9* 50.0* 38.0  MCV 88.2  --   --   PLT 85*  --   --     Basic Metabolic Panel: Recent Labs  Lab 07/08/23 2110 07/08/23 2115 07/08/23 2258  NA 135 135 130*  K 4.0 3.8 2.9*  CL 96*  --   --   CO2 23  --   --   GLUCOSE 70  --   --   BUN 11  --   --   CREATININE 0.87  --   --   CALCIUM  8.0*  --   --   MG 1.0*  --   --    GFR: Estimated Creatinine Clearance: 46.6 mL/min (by C-G formula based on SCr of 0.87 mg/dL). Recent Labs  Lab 07/08/23 2110 07/08/23 2115 07/08/23 2331  WBC 15.7*  --   --   LATICACIDVEN  --  1.3 3.4*    Liver Function Tests: Recent Labs  Lab 07/08/23 2110  AST 46*  ALT 24  ALKPHOS 67  BILITOT 1.3*  PROT 5.8*  ALBUMIN 2.9*   No results for input(s): LIPASE, AMYLASE in the last 168 hours. No results for input(s): AMMONIA in the last 168 hours.  ABG    Component Value Date/Time   PHART 7.508 (H) 07/08/2023 2258   PCO2ART 30.4 (L) 07/08/2023 2258   PO2ART 545 (H) 07/08/2023 2258   HCO3 23.9 07/08/2023 2258   TCO2 25 07/08/2023 2258   O2SAT 100 07/08/2023 2258     Coagulation Profile: Recent Labs  Lab 07/08/23 2312  INR 1.1    Cardiac Enzymes: No results for input(s): CKTOTAL, CKMB, CKMBINDEX, TROPONINI in the last 168 hours.  HbA1C: Hgb A1c MFr Bld  Date/Time Value Ref Range Status  01/21/2017 09:17 AM 7.1 (H) 4.8 - 5.6 % Final    Comment:    (NOTE) Pre diabetes:          5.7%-6.4% Diabetes:              >6.4% Glycemic control for   <7.0% adults with diabetes   06/13/2013 10:02 PM 8.3 (H) <5.7 % Final    Comment:    (NOTE)                                                                       According to the ADA Clinical Practice Recommendations for 2011, when HbA1c is used as a screening test:  >=6.5%   Diagnostic of Diabetes Mellitus           (if abnormal result is confirmed) 5.7-6.4%    Increased risk of developing  Diabetes Mellitus References:Diagnosis and Classification of Diabetes Mellitus,Diabetes Care,2011,34(Suppl 1):S62-S69 and Standards of Medical Care in         Diabetes - 2011,Diabetes Care,2011,34 (Suppl 1):S11-S61.    CBG: Recent Labs  Lab 07/08/23 2046 07/08/23 2110 07/08/23 2326  GLUCAP 45* 71 206*    Review of Systems:   Patient is encephalopathic and/or intubated; therefore, history has been obtained from chart review.    Past Medical History:  She,  has a past medical history of Anxiety, Arthritis, Diabetes mellitus without complication (HCC), Headache, High cholesterol, and Hypertension.   Surgical History:   Past Surgical History:  Procedure Laterality Date   AXILLARY LYMPH NODE BIOPSY Left 01/28/2017   Procedure: LEFT AXILLARY LYMPH NODE BIOPSY;  Surgeon: Curvin Deward MOULD, MD;  Location: [facility] OR;  Service: General;  Laterality: Left;  RNFA   BRONCHIAL BIOPSY  05/06/2023   Procedure: BRONCHIAL BIOPSIES;  Surgeon: Shelah Lamar RAMAN, MD;  Location: [facility] ENDOSCOPY;  Service: Pulmonary;;   BRONCHIAL BRUSHINGS  05/06/2023   Procedure: BRONCHIAL BRUSHINGS;  Surgeon: Shelah Lamar RAMAN, MD;  Location: [facility] ENDOSCOPY;  Service: Pulmonary;;   BRONCHIAL NEEDLE ASPIRATION BIOPSY  05/06/2023   Procedure: BRONCHIAL NEEDLE ASPIRATION BIOPSIES;  Surgeon: Shelah Lamar RAMAN, MD;  Location: [facility] ENDOSCOPY;  Service: Pulmonary;;   CARDIAC CATHETERIZATION     HEMOSTASIS CONTROL  05/06/2023   Procedure: HEMOSTASIS CONTROL;  Surgeon: Shelah Lamar RAMAN, MD;  Location: [facility] ENDOSCOPY;  Service: Pulmonary;;   LEFT HEART CATHETERIZATION WITH CORONARY ANGIOGRAM N/A 06/15/2013   Procedure: LEFT HEART CATHETERIZATION WITH CORONARY ANGIOGRAM;  Surgeon: Rober LOISE Chroman, MD;  Location: [facility] CATH LAB;  Service: Cardiovascular;  Laterality: N/A;   vaginal polyp removal       Social History:   reports that she has quit smoking. She has never used smokeless tobacco. She reports that she does not drink  alcohol  and does not use drugs.   Family History:  Her family history is not on file.   Allergies Allergies  Allergen Reactions   Penicillins Anaphylaxis, Hives, Rash and Other (See Comments)    Has patient had a PCN reaction causing immediate rash, facial/tongue/throat swelling, SOB or lightheadedness with hypotension: Yes Has patient had a PCN reaction causing severe rash involving mucus membranes or skin necrosis: Yes Has patient had a PCN reaction that required hospitalization No Has patient had a PCN reaction occurring within the last 10 years: Yes If all of the above answers are NO, then may proceed with Cephalosporin use.      Home Medications  Prior to Admission medications   Medication Sig Start Date End Date Taking? Authorizing Provider  acetaminophen  (TYLENOL ) 500 MG tablet Take 500 mg by mouth every 8 (eight) hours as needed for mild pain or headache.    Yes [provider]  albuterol  (VENTOLIN  HFA) 108 (90 Base) MCG/ACT inhaler Inhale 2 puffs into the lungs every 6 (six) hours as needed for wheezing or shortness of breath. 05/15/23  Yes Ruthell Lauraine FALCON, NP  amLODipine  (NORVASC ) 10 MG tablet Take 10 mg by mouth daily.   Yes [provider]  aspirin  EC 81 MG EC tablet Take 1 tablet (81 mg total) by mouth daily. 06/15/13  Yes Floretta Bethanne CROME, MD  busPIRone  (BUSPAR ) 7.5 MG tablet Take 10 mg by mouth 2 (two) times daily as needed (anxiety).   Yes [provider]  colchicine  (COLCRYS ) 0.6 MG tablet Take 0.6 mg by mouth 2 (two) times daily as needed (gout).  Yes [provider]  diclofenac sodium (VOLTAREN) 1 % GEL Apply 2 g topically 2 (two) times daily as needed (arthritic pain).   Yes [provider]  gabapentin  (NEURONTIN ) 100 MG capsule Take 100 mg by mouth at bedtime.   Yes [provider]  insulin  glargine (LANTUS ) 100 UNIT/ML injection Inject 34 Units into the skin 2 (two) times daily.    Yes [provider]  losartan  (COZAAR ) 100 MG tablet Take 100 mg by mouth daily 02/07/16  Yes [provider]  ondansetron  (ZOFRAN ) 4 MG tablet Take 1 tablet (4 mg total) by mouth every 6 (six) hours. 04/23/23  Yes Neysa Caron PARAS, DO  oxyCODONE -acetaminophen  (PERCOCET) 7.5-325 MG per tablet Take 0.5-1 tablets by mouth 2 (two) times daily as needed for severe pain.    Yes [provider]  pantoprazole  (PROTONIX ) 40 MG tablet Take 40 mg by mouth 2 (two) times daily as needed (heartburn).    Yes [provider]  rosuvastatin (CRESTOR) 5 MG tablet Take 5 mg by mouth at bedtime.   Yes [provider]  sitaGLIPtin -metformin  (JANUMET ) 50-500 MG per tablet Take 1 tablet by mouth daily.   Yes [provider]  tiZANidine (ZANAFLEX) 2 MG tablet Take 2 mg by mouth at bedtime as needed for muscle pain 11/05/16  Yes [provider]  triamterene -hydrochlorothiazide  (MAXZIDE -25) 37.5-25 MG tablet Take 1 tablet by mouth in the morning 11/05/16  Yes [provider]     Critical care time: 47 min     Deward Eastern, AGACNP-BC  Pulmonary & Critical Care  See Amion for personal pager PCCM on call pager [phone] until 7pm. Please call Elink 7p-7a. [phone]  07/09/2023 1:17 AM         '

## 2023-07-09 NOTE — Progress Notes (Signed)
 Initial Nutrition Assessment  DOCUMENTATION CODES:  Not applicable  INTERVENTION:  Initiate tube feeding via OGT: Start Osmolite 1.5 at 20 ml/h advancing by 10ml q10h to goal rate of 53ml/hr (1200 ml per day) Prosource TF20 60 ml once daily TF at goal provides 1880 kcal, 95 gm protein, 914 ml free water daily  NUTRITION DIAGNOSIS:  Increased nutrient needs related to cancer and cancer related treatments as evidenced by estimated needs.  GOAL:  Patient will meet greater than or equal to 90% of their needs  MONITOR:  Vent status, Labs, TF tolerance, Weight trends  REASON FOR ASSESSMENT:  Ventilator    ASSESSMENT:  Pt admitted with acute metabolic encephalopathy. PMH significant for recent diagnosis of lung adenocarcinoma, DM and HTN.  Pt discussed in IDT rounds.  Spoke with MD via secure chat. Agreeable to initiation of TF.   Pt notable hypoglycemic on admission, however now improved with administration of D10.   Patient is currently intubated on ventilator support MV: 10.3 L/min Temp (24hrs), Avg:100.3 F (37.9 C), Min:98.2 F (36.8 C), Max:102.4 F (39.1 C)  Admit weight: 75.8 kg Current weight: 71.3 kg Per review of documented weight history within the last year, it appears pt has had a weight loss of 20.1% between 03/12/23-07/09/23 which is clinically significant for time frame.  Pt also noted to have edema on admission and physical exam today which could also cause weight to appear higher than true dry weight.   Drains/lines: OGT (body of stomach) UOP: x24 hours  Medications: SSI 0-15 units q4h Drips: NaCl @ 10ml/hr Abx Levo @ 3mcg/min Propofol  @ 7.34ml/hr (provides 190 kcal per day at current rate)  Labs:  Sodium 128 Ionized calcium  1.09 CBG's 144-279 x24 hours  NUTRITION - FOCUSED PHYSICAL EXAM:  Flowsheet Row Most Recent Value  Orbital Region Unable to assess  Upper Arm Region No depletion  Thoracic and Lumbar Region No depletion  Buccal  Region Unable to assess  Temple Region No depletion  Clavicle Bone Region No depletion  Clavicle and Acromion Bone Region No depletion  Scapular Bone Region Unable to assess  Dorsal Hand Unable to assess  [in handmits]  Patellar Region No depletion  Anterior Thigh Region No depletion  Posterior Calf Region Unable to assess  [mild non-pitting edema L>R]  Edema (RD Assessment) Mild  [BLE]  Hair Reviewed  Eyes Unable to assess  Mouth Unable to assess  Skin Reviewed  Nails Unable to assess   Diet Order:   Diet Order             Diet NPO time specified  Diet effective now                   EDUCATION NEEDS:  No education needs have been identified at this time  Skin:  Skin Assessment: Reviewed RN Assessment  Last BM:  unknown/PTA  Height:  Ht Readings from Last 1 Encounters:  07/08/23 5' 7 (1.702 m)    Weight:  Wt Readings from Last 1 Encounters:  07/09/23 71.3 kg   BMI:  Body mass index is 24.62 kg/m.  Estimated Nutritional Needs:   Kcal:  1800-2000  Protein:  95-110g  Fluid:  >/=1.8L  Allie Leasia Swann, RDN, LDN Clinical Nutrition

## 2023-07-10 DIAGNOSIS — Z515 Encounter for palliative care: Secondary | ICD-10-CM

## 2023-07-10 DIAGNOSIS — G931 Anoxic brain damage, not elsewhere classified: Secondary | ICD-10-CM

## 2023-07-10 DIAGNOSIS — Z7189 Other specified counseling: Secondary | ICD-10-CM

## 2023-07-10 LAB — PHOSPHORUS: Phosphorus: 3.4 mg/dL (ref 2.5–4.6)

## 2023-07-10 LAB — BASIC METABOLIC PANEL
Anion gap: 12 (ref 5–15)
BUN: 20 mg/dL (ref 8–23)
CO2: 24 mmol/L (ref 22–32)
Calcium: 7.5 mg/dL — ABNORMAL LOW (ref 8.9–10.3)
Chloride: 97 mmol/L — ABNORMAL LOW (ref 98–111)
Creatinine, Ser: 1.03 mg/dL — ABNORMAL HIGH (ref 0.44–1.00)
GFR, Estimated: 52 mL/min — ABNORMAL LOW (ref >60)
Glucose, Bld: 160 mg/dL — ABNORMAL HIGH (ref 70–99)
Potassium: 2.9 mmol/L — ABNORMAL LOW (ref 3.5–5.1)
Sodium: 133 mmol/L — ABNORMAL LOW (ref 135–145)

## 2023-07-10 LAB — CBC
HCT: 31.3 % — ABNORMAL LOW (ref 36.0–46.0)
Hemoglobin: 11 g/dL — ABNORMAL LOW (ref 12.0–15.0)
MCH: 30.6 pg (ref 26.0–34.0)
MCHC: 35.1 g/dL (ref 30.0–36.0)
MCV: 86.9 fL (ref 80.0–100.0)
Platelets: 157 10*3/uL (ref 150–400)
RBC: 3.6 MIL/uL — ABNORMAL LOW (ref 3.87–5.11)
RDW: 15 % (ref 11.5–15.5)
WBC: 13.8 10*3/uL — ABNORMAL HIGH (ref 4.0–10.5)
nRBC: 0 % (ref 0.0–0.2)

## 2023-07-10 LAB — GLUCOSE, CAPILLARY
Glucose-Capillary: 131 mg/dL — ABNORMAL HIGH (ref 70–99)
Glucose-Capillary: 150 mg/dL — ABNORMAL HIGH (ref 70–99)
Glucose-Capillary: 163 mg/dL — ABNORMAL HIGH (ref 70–99)
Glucose-Capillary: 86 mg/dL (ref 70–99)

## 2023-07-10 LAB — MAGNESIUM: Magnesium: 1.7 mg/dL (ref 1.7–2.4)

## 2023-07-10 MED ORDER — LORAZEPAM 2 MG/ML IJ SOLN
2.0000 mg | INTRAMUSCULAR | Status: DC | PRN
  Administered 2023-07-10 (×3): 2 mg via INTRAVENOUS
  Filled 2023-07-10 (×3): qty 1

## 2023-07-10 MED ORDER — MORPHINE BOLUS VIA INFUSION
2.0000 mg | INTRAVENOUS | Status: DC | PRN
  Administered 2023-07-10 (×2): 2 mg via INTRAVENOUS

## 2023-07-10 MED ORDER — BIOTENE DRY MOUTH MT LIQD: 15.0000 mL | OROMUCOSAL | Status: DC | PRN

## 2023-07-10 MED ORDER — ONDANSETRON HCL 4 MG/2ML IJ SOLN: 4.0000 mg | Freq: Four times a day (QID) | INTRAMUSCULAR | Status: DC | PRN

## 2023-07-10 MED ORDER — GLYCOPYRROLATE 1 MG PO TABS: 1.0000 mg | ORAL_TABLET | ORAL | Status: DC | PRN

## 2023-07-10 MED ORDER — SCOPOLAMINE 1 MG/3DAYS TD PT72
1.0000 | MEDICATED_PATCH | TRANSDERMAL | Status: DC
  Administered 2023-07-10: 1.5 mg via TRANSDERMAL
  Filled 2023-07-10: qty 1

## 2023-07-10 MED ORDER — GLYCOPYRROLATE 0.2 MG/ML IJ SOLN
0.4000 mg | INTRAMUSCULAR | Status: DC
  Administered 2023-07-10 – 2023-07-11 (×6): 0.4 mg via INTRAVENOUS
  Filled 2023-07-10 (×5): qty 2

## 2023-07-10 MED ORDER — MORPHINE 100MG IN NS 100ML (1MG/ML) PREMIX INFUSION
4.0000 mg/h | INTRAVENOUS | Status: DC
  Administered 2023-07-10: 4 mg/h via INTRAVENOUS
  Administered 2023-07-10 – 2023-07-11 (×3): 15 mg/h via INTRAVENOUS
  Filled 2023-07-10 (×4): qty 100

## 2023-07-10 MED ORDER — ONDANSETRON 4 MG PO TBDP: 4.0000 mg | ORAL_TABLET | Freq: Four times a day (QID) | ORAL | Status: DC | PRN

## 2023-07-10 MED ORDER — POTASSIUM CHLORIDE 10 MEQ/100ML IV SOLN
10.0000 meq | INTRAVENOUS | Status: AC
  Administered 2023-07-10 (×4): 10 meq via INTRAVENOUS
  Filled 2023-07-10 (×4): qty 100

## 2023-07-10 MED ORDER — POTASSIUM CHLORIDE 20 MEQ PO PACK
40.0000 meq | PACK | Freq: Once | ORAL | Status: AC
  Administered 2023-07-10: 40 meq
  Filled 2023-07-10: qty 2

## 2023-07-10 MED ORDER — POLYVINYL ALCOHOL 1.4 % OP SOLN: 1.0000 [drp] | Freq: Four times a day (QID) | OPHTHALMIC | Status: DC | PRN

## 2023-07-10 MED ORDER — MAGNESIUM SULFATE 2 GM/50ML IV SOLN
2.0000 g | Freq: Once | INTRAVENOUS | Status: AC
  Administered 2023-07-10: 2 g via INTRAVENOUS
  Filled 2023-07-10: qty 50

## 2023-07-10 MED ORDER — GLYCOPYRROLATE 0.2 MG/ML IJ SOLN
0.2000 mg | INTRAMUSCULAR | Status: DC | PRN
  Filled 2023-07-10: qty 1

## 2023-07-10 MED ORDER — GLYCOPYRROLATE 0.2 MG/ML IJ SOLN: 0.2000 mg | INTRAMUSCULAR | Status: DC | PRN

## 2023-07-10 MED ORDER — DEXTROSE 50 % IV SOLN
INTRAVENOUS | Status: AC
  Filled 2023-07-10: qty 50

## 2023-07-10 NOTE — Progress Notes (Signed)
   NAME:  Rhonda Conrad, MRN:  [id number], DOB:  30-Jun-1931, LOS: 1 ADMISSION DATE:  07/08/2023, CONSULTATION DATE:  2/4 REFERRING MD:  Dr. Tonia, CHIEF COMPLAINT:  acute encephalopathy   History of Present Illness:  89 year old female with PMH as below, which is significant for DM, lung cancer, and HTN.  Recently seen by Dr Shelah in the pulmonary clinic for lung nodule and underwent bronch/biopsy showing adenocarcinoma. She was referred to rad onc, but had not followed up yet after missing a couple of appointments. She had a PCP appointment not too long ago and was found to have high A1c but insulin  was not increased. Oxycodone  PRN increased for pain.   2/3 0300 her niece whom she lives with heard a thud and found her on the floor. Could not get her back in bed. EMS was called and assisted back into bed, but the patient refused to be evaluated. In the following hours the patient became less responsive with sonorous respirations. When this persisted for 12-15 hours family called EMS for transport to the ED. Upon EMS arrival her blood sugar was found to be in the 50s and she was given dextrose . Blood sugars remained low upon arrival to the ED prompting more dextrose  and ultimately an infusion. Also found to be febrile. Airway protection was marginal and she was intubated prior to getting CT head, which did not show acute pathology. Antibiotics initiated. PCCM asked to admit.   Pertinent  Medical History   has a past medical history of Anxiety, Arthritis, Diabetes mellitus without complication (HCC), Headache, High cholesterol, and Hypertension.   Significant Hospital Events: Including procedures, antibiotic start and stop dates in addition to other pertinent events   2/4 admit with encephalopathy intubated in ED.   Interim History / Subjective:  Remains poorly responsive.  Objective   Blood pressure 113/65, pulse 95, temperature 98.6 F (37 C), temperature source Axillary, resp. rate 15,  height 5' 7 (1.702 m), weight 71.3 kg, SpO2 100%.    Vent Mode: PRVC FiO2 (%):  [40 %] 40 % Set Rate:  [15 bmp] 15 bmp Vt Set:  [500 mL] 500 mL PEEP:  [5 cmH20] 5 cmH20 Plateau Pressure:  [17 cmH20-20 cmH20] 19 cmH20   Intake/Output Summary (Last 24 hours) at 07/10/2023 0710 Last data filed at 07/10/2023 0600 Gross per 24 hour  Intake 2045.14 ml  Output 1045 ml  Net 1000.14 ml   Filed Weights   07/08/23 2303 07/09/23 0230  Weight: 75.8 kg 71.3 kg    Examination: L pupil irregular, R reactive Flexor posturing with suctioning Triggers vent Thick secretions Abd soft Rhonci Ext warm   Labs pending  Resolved Hospital Problem list     Assessment & Plan:   Anoxic brain injury- confirmed on MRI, found down after ~15 hours Acute respiratory failure with hypoxia Probable aspiration DM Hypoglycemia Hypocalcemia HTN  - Continue vent support, abx - Mental status precludes extubation - PMT pending - RN to call in family so we can discuss MRI findings and GOC, remains full code as of now  31 min cc time Rolan Sharps MD PCCM        '

## 2023-07-10 NOTE — Progress Notes (Signed)
 Patient was compassionately extubated at 1611 with family present at the bedside.

## 2023-07-10 NOTE — Progress Notes (Signed)
 Pharmacy Electrolyte Replacement  Recent Labs:  Recent Labs    07/10/23 0659  K 2.9*  MG 1.7  PHOS 3.4  CREATININE 1.03*    Low Critical Values (K </= 2.5, Phos </= 1, Mg </= 1) Present: None  MD Contacted: None   Plan: Replace per protocol with 40mEq KCL per tube, 40mEq IV KCL, and 2g mag sulfate. Recheck in AM.   Powell Blush, PharmD, BCCCP

## 2023-07-10 NOTE — Progress Notes (Signed)
 Per Dr. Felipe Horton, called Lorinda Root (Daughter) to set up a meeting w/palliative and discuss GOC with family.  Family will be here this morning and meeting expected to be around 1300.

## 2023-07-10 NOTE — Progress Notes (Signed)
 Transported patient to and from MRI while patient was on the ventilator. Patient remained stable during transport and procedure.

## 2023-07-10 NOTE — Consult Note (Signed)
 Palliative Medicine Inpatient Consult Note  Consulting Provider: Dr. Claudene  Reason for consult:  Goals of Care  07/10/2023  HPI:  Per intake H&P -->   89 year old female with PMH as below, which is significant for DM, lung cancer, and HTN. Recently seen by Dr Shelah in the pulmonary clinic for lung nodule and underwent bronch/biopsy showing adenocarcinoma. The PMT has been asked to get involved to support additional goals of care conversations.  Clinical Assessment/Goals of Care:  *Please note that this is a verbal dictation therefore any spelling or grammatical errors are due to the Dragon Medical One system interpretation.  A family meeting was held this afternoon with Dr. Claudene, Rhonda Conrad, APP and myself.   Family members present were:   Rhonda Conrad, niece, Rhonda Conrad, nephew, Rhonda Conrad, nephew, Rhonda Conrad, sister, Rhonda Conrad, niece, Rhonda Conrad, niece, Rhonda Conrad, niece, Rhonda Conrad, son, Rhonda Conrad, close friend, Rhonda Conrad, granddaughter, Rhonda Conrad, niece, and Rhonda Conrad, niece.   I have reviewed medical records including EPIC notes, labs and imaging, received report from bedside RN, assessed the patient.    I met with Rhonda Conrad to further discuss diagnosis prognosis, GOC, EOL wishes, disposition and options.   I introduced Palliative Medicine as specialized medical care for people living with serious illness. It focuses on providing relief from the symptoms and stress of a serious illness. The goal is to improve quality of life for both the patient and the family.  Medical History Review and Understanding:  Rhonda Conrad has a past medical history significant for DM, HTN, new lung adenocarcinoma, and HLD.  Social History:  Rhonda Conrad lives in [place], [place]. She is not married. She has a son and daughter. She is formerly a designer, jewellery. Rhonda Conrad is a woman of strong faith.  Functional and Nutritional State:  Rhonda Conrad was functional preceding admission. Her niece was helping to care for  her as her daughter has cared for her in [place]  for the prior six years.   Advance Directives:  A detailed discussion was had today regarding advanced directives. Patients two children, Rhonda Conrad and Rhonda Conrad are her decision makers.     Code Status:  Concepts specific to code status, artifical feeding and hydration, continued IV antibiotics and rehospitalization was had.  The difference between a aggressive medical intervention path  and a palliative comfort care path for this patient at this time was had.   Patient has been made a DNAR/DNI code status.   Discussion:  Conversations were held discussing Rhonda Conrad's present health state inclusive of her hypoxic respiratory failure which is though to be related to an aspirational event. We reviewed as a result of this that Rhonda Conrad has suffered anoxic brain injury. Dr. Claudene shared that even if patient had decided to get medical treatment that the outcome could have very likely been the same.  When Dr. Claudene left the room the patients family and I discussed options moving forward. We reviewed one - a more aggressive path which would sadly be unlikely to change the outcomes versus a less aggressive path of comfort care.   We talked about transition to comfort measures in house and what that would entail inclusive of medications to control pain, dyspnea, agitation, nausea, itching, and hiccups.  We discussed stopping all uneccessary measures such as cardiac monitoring, blood draws, needle sticks, and frequent vital signs.   We reviewed in Rhonda Conrad case we would likely initiate medications via drip to support her symptoms.   Conversations we had in the setting of patients wishes and what she  would have wanted. We reviewed that Rhonda Conrad had a living will and her daughter, Rhonda Conrad shares she would not want to be mechanically supported.Rhonda Conrad notes that she recently found and reviewed the paperwork with her brother, Rhonda Conrad. They are both in agreement that  Rhonda Conrad would not desire living as she is now.   The decision was made to pursue compassionate extubation. Comfort medications will be initiated.   Decision Maker: Rhonda Conrad Rhonda Conrad (Daughter): [phone] (Mobile)   SUMMARY OF RECOMMENDATIONS   DNAR/DNI  Comfort Care  Plan for compassionate extubation this afternoon  Start morphine  gtt with titration and boluses  Continue propofol  gtt at this time - if patients transition out of ICU then we can transition to ativan  gtt  Unrestricted visitation  Anticipate in hospital death  Ongoing PMT support  Code Status/Advance Care Planning: DNAR/DNI  Palliative Prophylaxis:  Aspiration, Bowel Regimen, Delirium Protocol, Frequent Pain Assessment, Oral Care, Palliative Wound Care, and Turn Reposition  Additional Recommendations (Limitations, Scope, Preferences): Continue current care  Psycho-social/Spiritual:  Desire for further Chaplaincy support: Patient has their own pastoral support Additional Recommendations: Education on EOL care   Prognosis: Hours  Discharge Planning: Discharge will be celestial.  Vitals:   07/10/23 1157 07/10/23 1207  BP:    Pulse: 90 92  Resp:  15  Temp:    SpO2: 100% 100%    Intake/Output Summary (Last 24 hours) at 07/10/2023 1336 Last data filed at 07/10/2023 1003 Gross per 24 hour  Intake 2227.85 ml  Output 1080 ml  Net 1147.85 ml   Last Weight  Most recent update: 07/09/2023  3:07 AM    Weight  71.3 kg (157 lb 3 oz)            Gen:  Elderly AA F critically ill appearing HEENT: ETT, OGT, dry mucous membranes CV: Regular rate and rhythm  PULM: On mechanical ventilator ABD: soft/nontender  EXT: No edema  Neuro: Somnolent - on prop gtt  PPS: 10%   This conversation/these recommendations were discussed with patient primary care team, Dr. Claudene  Total Time: 135 Billing based on MDM: High ______________________________________________________ Rhonda Conrad [facility]  Palliative Medicine Team Team Cell Phone: [phone] Please utilize secure chat with additional questions, if there is no response within 30 minutes please call the above phone number  Palliative Medicine Team providers are available by phone from 7am to 7pm daily and can be reached through the team cell phone.  Should this patient require assistance outside of these hours, please call the patient's attending physician.

## 2023-07-11 DIAGNOSIS — G931 Anoxic brain damage, not elsewhere classified: Secondary | ICD-10-CM | POA: Diagnosis not present

## 2023-07-11 DIAGNOSIS — Z66 Do not resuscitate: Secondary | ICD-10-CM | POA: Diagnosis not present

## 2023-07-11 DIAGNOSIS — Z515 Encounter for palliative care: Secondary | ICD-10-CM | POA: Diagnosis not present

## 2023-07-11 DIAGNOSIS — G9341 Metabolic encephalopathy: Secondary | ICD-10-CM | POA: Diagnosis not present

## 2023-07-11 DIAGNOSIS — J690 Pneumonitis due to inhalation of food and vomit: Secondary | ICD-10-CM | POA: Diagnosis not present

## 2023-07-11 LAB — CULTURE, RESPIRATORY W GRAM STAIN: Culture: NORMAL

## 2023-07-13 LAB — CULTURE, BLOOD (ROUTINE X 2)
Culture: NO GROWTH
Culture: NO GROWTH
Special Requests: ADEQUATE

## 2023-07-31 ENCOUNTER — Ambulatory Visit: Payer: 59 | Admitting: Radiation Oncology

## 2023-07-31 ENCOUNTER — Ambulatory Visit: Payer: 59

## 2023-08-03 NOTE — Death Summary Note (Signed)
 DEATH SUMMARY   Patient Details  Name: Rhonda Conrad MRN: [id number] DOB: 1932-03-06  Admission/Discharge Information   Admit Date:  03-Aug-2023  Date of Death:  2023-08-06  Time of Death:  07-Jun-1319  Length of Stay: 2  Referring Physician: Arloa Elsie SAUNDERS, MD     Diagnoses  Preliminary cause of death: Anoxic encephalopathy x 2 days Comorbidities: early stage lung cancer, Anxiety, Arthritis, Diabetes mellitus without complication (HCC), Headache, High cholesterol, and Hypertension.   Brief Hospital Course (including significant findings, care, treatment, and services provided and events leading to death)  89 year old female with PMH as below, which is significant for DM, lung cancer, and HTN.  Recently seen by Dr Shelah in the pulmonary clinic for lung nodule and underwent bronch/biopsy showing adenocarcinoma. She was referred to rad onc, but had not followed up yet after missing a couple of appointments. She had a PCP appointment not too long ago and was found to have high A1c but insulin  was not increased. Oxycodone  PRN increased for pain.    2/3 0300 her niece whom she lives with heard a thud and found her on the floor. Could not get her back in bed. EMS was called and assisted back into bed, but the patient refused to be evaluated. In the following hours the patient became less responsive with sonorous respirations. When this persisted for 12-15 hours family called EMS for transport to the ED. Upon EMS arrival her blood sugar was found to be in the 50s and she was given dextrose . Blood sugars remained low upon arrival to the ED prompting more dextrose  and ultimately an infusion. Also found to be febrile. Airway protection was marginal and she was intubated prior to getting CT head, which did not show acute pathology. Antibiotics initiated. PCCM asked to admit.   Patient's workup revealed severe brain injury on MRI and she remained comatose off sedation. After discussion regarding patient's  values and preferences at end of life, family agreed she would want natural comfortable passing.   Pertinent Labs and Studies  Significant Diagnostic Studies MR BRAIN WO CONTRAST Result Date: 07/10/2023 CLINICAL DATA:  Initial evaluation for mental status change, unknown cause. EXAM: MRI HEAD WITHOUT CONTRAST TECHNIQUE: Multiplanar, multiecho pulse sequences of the brain and surrounding structures were obtained without intravenous contrast. COMPARISON:  CT from 08/03/23 FINDINGS: Brain: Cerebral volume within normal limits. Probable changes of mild chronic microvascular ischemic disease noted. Symmetric FLAIR and diffusion signal abnormality is seen diffusely throughout the cortical gray matter of both cerebral hemispheres. Patchy and symmetric involvement of the bilateral basal ganglia, primarily the lentiform nuclei, with additional involvement of the posterior limbs of the internal capsules. Findings are nonspecific, but could reflect changes of acute hypoglycemic encephalopathy. Changes of hypoxic ischemic encephalopathy would be the primary differential consideration. No superimposed acute vascular infarct. No acute or chronic intracranial blood products. 2.1 cm well-circumscribed extra-axial mass at the right CP angle cistern, likely a meningioma. Mild mass effect on the subjacent right cerebral hemisphere without parenchymal edema. No other mass lesion or midline shift. No hydrocephalus or extra-axial fluid collection. Pituitary gland and suprasellar region within normal limits. Vascular: Major intracranial vascular flow voids are maintained. Skull and upper cervical spine: Craniocervical junction within normal limits. Bone marrow signal intensity normal. No scalp soft tissue abnormality. Sinuses/Orbits: Prior bilateral ocular lens replacement. Scattered mucosal thickening noted about the ethmoidal air cells. Trace bilateral mastoid effusions. Patient is intubated. Other: None. IMPRESSION: 1. Diffuse  symmetric FLAIR and diffusion signal  abnormality throughout the cortical gray matter of both cerebral hemispheres, with additional symmetric involvement of the bilateral basal ganglia. Findings are nonspecific, with primary differential consideration including sequelae of acute hypoglycemic encephalopathy. Changes of hypoxic ischemic encephalopathy would be the primary differential consideration. 2. 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 3. Underlying mild chronic microvascular ischemic disease. Electronically Signed   By: Morene Hoard M.D.   On: 07/10/2023 01:05   EEG adult Result Date: 07/09/2023 Shelton Arlin KIDD, MD     07/09/2023 10:29 AM Patient Name: [name] MRN: [id number] Epilepsy Attending: Arlin KIDD Shelton Referring Physician/Provider: Claudene Toribio BROCKS, MD Date: 07/09/2023 Duration: 23.35 mins Patient history: 89yo F with ams. EEG to evaluate for seizure Level of alertness: comatose/ lethargic AEDs during EEG study: None Technical aspects: This EEG study was done with scalp electrodes positioned according to the 10-20 International system of electrode placement. Electrical activity was reviewed with band pass filter of 1-70Hz , sensitivity of 7 uV/mm, display speed of 43mm/sec with a 60Hz  notched filter applied as appropriate. EEG data were recorded continuously and digitally stored.  Video monitoring was available and reviewed as appropriate. Description: EEG showed continuous generalized polymorphic high amplitude predominantly 2 to 3 Hz delta slowing admixed with intermittent 5 to 6 Hz theta slowing.  Intermittently throughout the study, patient was noted to have subtle head jerking.  Concomitant EEG before, during and after the event did not show any EEG to suggest seizure.  Hyperventilation and photic stimulation were not performed.   ABNORMALITY -Continuous slow, generalized IMPRESSION: This study is suggestive of severe diffuse encephalopathy.  No seizures or epileptiform  discharges were seen throughout the recording. Intermittently throughout the study, patient was noted to have subtle head jerking without concomitant EEG change.  These were most likely not epileptic events. Priyanka KIDD Shelton   US  Abdomen Limited RUQ (LIVER/GB) Result Date: 07/09/2023 CLINICAL DATA:  89 year old female with hyperbilirubinemia. EXAM: ULTRASOUND ABDOMEN LIMITED RIGHT UPPER QUADRANT COMPARISON:  CT Abdomen and Pelvis 04/23/2023. FINDINGS: Gallbladder: No gallstones or wall thickening visualized. No sonographic Murphy sign noted by sonographer. Common bile duct: Diameter: 3-4 mm, normal. Liver: Small left hepatic lobe circumscribed and benign appearing cysts redemonstrated (images 21 and 22), no follow-up imaging recommended. No evidence of intrahepatic biliary ductal dilatation. Mildly coarse parenchymal echotexture, and increased echogenicity compared to the right kidney (image 39). No other discrete liver lesion. Portal vein is patent on color Doppler imaging with normal direction of blood flow towards the liver. Other: Negative visible right kidney. IMPRESSION: 1. Normal gallbladder and bile ducts. 2. Echogenic and coarse hepatic echotexture raising the possibility of steatosis and/or chronic liver disease. Electronically Signed   By: VEAR Hurst M.D.   On: 07/09/2023 05:40   CT ANGIO HEAD NECK W WO CM Result Date: 07/08/2023 CLINICAL DATA:  Sudden severe headache. Fell last night. Unresponsive. EXAM: CT ANGIOGRAPHY HEAD AND NECK WITH AND WITHOUT CONTRAST TECHNIQUE: Multidetector CT imaging of the head and neck was performed using the standard protocol during bolus administration of intravenous contrast. Multiplanar CT image reconstructions and MIPs were obtained to evaluate the vascular anatomy. Carotid stenosis measurements (when applicable) are obtained utilizing NASCET criteria, using the distal internal carotid diameter as the denominator. RADIATION DOSE REDUCTION: This exam was performed  according to the departmental dose-optimization program which includes automated exposure control, adjustment of the mA and/or kV according to patient size and/or use of iterative reconstruction technique. CONTRAST:  75mL OMNIPAQUE  IOHEXOL  350 MG/ML SOLN COMPARISON:  11/06/2022  FINDINGS: CT HEAD FINDINGS Brain: No acute intracranial finding. No evidence of stroke, intra-axial mass, hemorrhage, hydrocephalus or extra-axial collection. There is a 2.3 cm extra-axial mass projecting posterior from the temporal bone on the right quite likely to represent a meningioma. Vascular: There is atherosclerotic calcification of the major vessels at the base of the brain. Skull: Negative Sinuses/Orbits: Clear/normal Other: None Review of the MIP images confirms the above findings CTA NECK FINDINGS Aortic arch: Aortic atherosclerosis of a mild degree. Branching pattern is normal without origin stenosis. Right carotid system: Common carotid artery widely patent to the bifurcation. Carotid bifurcation is normal without soft or calcified plaque. No stenosis. Left carotid system: Left carotid system similarly normal. Vertebral arteries: Both vertebral artery origins are widely patent. Both vertebral arteries are widely patent through the cervical region to the foramen magnum. Skeleton: Chronic degenerative spondylosis without acute finding. Other neck: No mass or lymphadenopathy. Upper chest: Irregular marginated 1.7 cm mass in the left upper lobe quite likely to represent lung carcinoma. This has been previously evaluated including with PET scan. Review of the MIP images confirms the above findings CTA HEAD FINDINGS Anterior circulation: Both internal carotid arteries are patent through the skull base and siphon regions. There is ordinary siphon atherosclerotic calcification but no stenosis. The anterior and middle cerebral vessels are patent. No large vessel occlusion or proximal stenosis. No aneurysm or vascular malformation.  Posterior circulation: Both vertebral arteries widely patent through the foramen magnum to the basilar artery. No basilar stenosis. Posterior circulation branch vessels are normal. Venous sinuses: Patent and normal. Anatomic variants: None significant. Review of the MIP images confirms the above findings IMPRESSION: 1. No acute intracranial CT finding. 2.3 cm extra-axial mass projecting posterior from the temporal bone on the right quite likely to represent a meningioma. 2. No intracranial large or medium vessel occlusion or correctable proximal stenosis. 3. Aortic atherosclerosis. 4. Irregular marginated 1.7 cm mass in the left upper lobe quite likely to represent lung carcinoma. This has been previously evaluated including with PET scan. Electronically Signed   By: Oneil Officer M.D.   On: 07/08/2023 23:19   DG Chest Portable 1 View Result Date: 07/08/2023 CLINICAL DATA:  Endotracheal and OG tube placements EXAM: PORTABLE CHEST 1 VIEW COMPARISON:  07/08/2023 FINDINGS: Endotracheal tube placed with tip measuring 2.1 cm above the carina. Enteric tube is placed with tip projecting over the left upper quadrant consistent with location in the body of the stomach. Shallow inspiration. Heart size and pulmonary vascularity are normal. Left upper lung nodule projecting over the scapula corresponding to prior study. No change. No evidence of pulmonary infiltration or edema. No consolidation. No pleural effusion or pneumothorax. Surgical clips in the left axilla. IMPRESSION: Appliances appear in satisfactory position. No evidence of active pulmonary disease. Electronically Signed   By: Elsie Gravely M.D.   On: 07/08/2023 22:38   DG Chest Portable 1 View Result Date: 07/08/2023 CLINICAL DATA:  Altered mental status EXAM: PORTABLE CHEST 1 VIEW COMPARISON:  05/06/2023 FINDINGS: Surgical clips in the left axilla. No acute airspace disease or effusion. Stable cardiomediastinal silhouette. No pneumothorax. 1.7 cm left upper  lobe pulmonary nodule as seen on prior chest CT. IMPRESSION: No active disease. 1.7 cm left upper lobe pulmonary nodule as seen on prior chest CT. Electronically Signed   By: Luke Bun M.D.   On: 07/08/2023 22:03    Microbiology Recent Results (from the past 240 hours)  Resp panel by RT-PCR (RSV, Flu A&B, Covid)  Status: None   Collection Time: 07/08/23  9:02 PM   Specimen: Nasal Swab  Result Value Ref Range Status   SARS Coronavirus 2 by RT PCR NEGATIVE NEGATIVE Final   Influenza A by PCR NEGATIVE NEGATIVE Final   Influenza B by PCR NEGATIVE NEGATIVE Final    Comment: (NOTE) The Xpert Xpress SARS-CoV-2/FLU/RSV plus assay is intended as an aid in the diagnosis of influenza from Nasopharyngeal swab specimens and should not be used as a sole basis for treatment. Nasal washings and aspirates are unacceptable for Xpert Xpress SARS-CoV-2/FLU/RSV testing.  Fact Sheet for Patients: bloggercourse.com  Fact Sheet for Healthcare Providers: seriousbroker.it  This test is not yet approved or cleared by the [place]  FDA and has been authorized for detection and/or diagnosis of SARS-CoV-2 by FDA under an Emergency Use Authorization (EUA). This EUA will remain in effect (meaning this test can be used) for the duration of the COVID-19 declaration under Section 564(b)(1) of the Act, 21 U.S.C. section 360bbb-3(b)(1), unless the authorization is terminated or revoked.     Resp Syncytial Virus by PCR NEGATIVE NEGATIVE Final    Comment: (NOTE) Fact Sheet for Patients: bloggercourse.com  Fact Sheet for Healthcare Providers: seriousbroker.it  This test is not yet approved or cleared by the [place]  FDA and has been authorized for detection and/or diagnosis of SARS-CoV-2 by FDA under an Emergency Use Authorization (EUA). This EUA will remain in effect (meaning this test can be used)  for the duration of the COVID-19 declaration under Section 564(b)(1) of the Act, 21 U.S.C. section 360bbb-3(b)(1), unless the authorization is terminated or revoked.  Performed at [facility], [address] [place], [place] [address]   Blood Culture (routine x 2)     Status: None (Preliminary result)   Collection Time: 07/08/23  9:35 PM   Specimen: BLOOD LEFT HAND  Result Value Ref Range Status   Specimen Description BLOOD LEFT HAND  Final   Special Requests   Final    BOTTLES DRAWN AEROBIC AND ANAEROBIC Blood Culture results may not be optimal due to an inadequate volume of blood received in culture bottles   Culture   Final    NO GROWTH 3 DAYS Performed at [facility], [address], [place], [place] [address]    Report Status PENDING  Incomplete  Blood Culture (routine x 2)     Status: None (Preliminary result)   Collection Time: 07/08/23 10:09 PM   Specimen: BLOOD  Result Value Ref Range Status   Specimen Description BLOOD LEFT ANTECUBITAL  Final   Special Requests   Final    BOTTLES DRAWN AEROBIC ONLY Blood Culture adequate volume   Culture   Final    NO GROWTH 3 DAYS Performed at [facility], [address], [place], [place] [address]    Report Status PENDING  Incomplete  MRSA Next Gen by PCR, Nasal     Status: None   Collection Time: 07/09/23  2:24 AM   Specimen: Nasal Mucosa; Nasal Swab  Result Value Ref Range Status   MRSA by PCR Next Gen NOT DETECTED NOT DETECTED Final    Comment: (NOTE) The GeneXpert MRSA Assay (FDA approved for NASAL specimens only), is one component of a comprehensive MRSA colonization surveillance program. It is not intended to diagnose MRSA infection nor to guide or monitor treatment for MRSA infections. Test performance is not FDA approved in patients less than 10 years old. Performed at [facility],  [address] [place], [place] [address]   Culture, Respiratory w Gram Stain (tracheal aspirate)      Status: None   Collection Time: 07/09/23  5:11 AM   Specimen: Tracheal Aspirate; Respiratory  Result Value Ref Range Status   Specimen Description TRACHEAL ASPIRATE  Final   Special Requests NONE  Final   Gram Stain   Final    MODERATE WBC PRESENT,BOTH PMN AND MONONUCLEAR FEW GRAM POSITIVE COCCI IN PAIRS    Culture   Final    RARE Normal respiratory flora-no Staph aureus or Pseudomonas seen Performed at [facility], [address], [place], [place] [address]    Report Status 07/25/23 FINAL  Final    Lab Basic Metabolic Panel: Recent Labs  Lab 07/08/23 2110 07/08/23 2115 07/08/23 2258 07/09/23 0142 07/09/23 0514 07/09/23 1409 07/10/23 0659  NA 135   < > 130* 130* 128* 133* 133*  K 4.0   < > 2.9* 4.1 3.7 3.2* 2.9*  CL 96*  --   --  92*  --   --  97*  CO2 23  --   --  22  --   --  24  GLUCOSE 70  --   --  264*  --   --  160*  BUN 11  --   --  11  --   --  20  CREATININE 0.87  --   --  1.04*  --   --  1.03*  CALCIUM  8.0*  --   --  7.7*  --   --  7.5*  MG 1.0*  --   --  2.5*  --   --  1.7  PHOS  --   --   --  3.4  --   --  3.4   < > = values in this interval not displayed.   Liver Function Tests: Recent Labs  Lab 07/08/23 2110  AST 46*  ALT 24  ALKPHOS 67  BILITOT 1.3*  PROT 5.8*  ALBUMIN 2.9*   No results for input(s): LIPASE, AMYLASE in the last 168 hours. Recent Labs  Lab 07/09/23 0142  AMMONIA 10   CBC: Recent Labs  Lab 07/08/23 2110 07/08/23 2115 07/08/23 2258 07/09/23 0142 07/09/23 0514 07/09/23 1409 07/10/23 0659  WBC 15.7*  --   --  17.5*  --   --  13.8*  NEUTROABS 13.5*  --   --   --   --   --   --   HGB 15.9*   < > 12.9 14.1 13.9 11.6* 11.0*  HCT 46.9*   < > 38.0 41.9 41.0 34.0* 31.3*  MCV 88.2  --   --  89.5  --   --  86.9  PLT 85*  --   --  196  --   --  157   < > = values in this interval not displayed.   Cardiac Enzymes: Recent Labs  Lab 07/09/23 0002  CKTOTAL 380*   Sepsis Labs: Recent Labs  Lab 07/08/23 2110  07/08/23 2115 07/08/23 2331 07/09/23 0142 07/09/23 0346 07/09/23 1154 07/10/23 0659  PROCALCITON  --   --   --  0.23  --   --   --   WBC 15.7*  --   --  17.5*  --   --  13.8*  LATICACIDVEN  --  1.3 3.4*  --  4.3* 3.2*  --      Toribio JAYSON Sharps July 25, 2023, 2:32 PM

## 2023-08-03 NOTE — Progress Notes (Signed)
 Nutrition Brief Note  Chart reviewed. Pt now transitioning to comfort care.  No further nutrition interventions planned at this time.  Please re-consult as needed.   Drusilla Kanner, RDN, LDN Clinical Nutrition

## 2023-08-03 NOTE — Progress Notes (Signed)
 Handoff report given to adolly fubna rn

## 2023-08-03 NOTE — Plan of Care (Signed)
  Problem: Pain Managment: Goal: General experience of comfort will improve and/or be controlled Outcome: Progressing   Problem: Safety: Goal: Ability to remain free from injury will improve Outcome: Progressing   Problem: Skin Integrity: Goal: Risk for impaired skin integrity will decrease Outcome: Progressing

## 2023-08-03 NOTE — Progress Notes (Signed)
 2023-07-26   I have seen and evaluated the patient for EOL care   S:  No events Comatose.   O: Blood pressure 96/66, pulse 64, temperature 97.6 F (36.4 C), temperature source Oral, resp. rate 17, height 5' 3 (1.6 m), weight 81.6 kg, SpO2 94 %.    No distress Comatose Lungs diminished bases RASS -4  A:  Anoxic brain injury at EOL   P:  Morphine  for comfort, in hospital death expected Okay to transfer to floor for ongoing care   Rolan Sharps MD [facility] Prefer epic messenger for cross cover needs If after hours, please call E-link

## 2023-08-03 NOTE — Progress Notes (Signed)
 Daily Progress Note   Patient Name: Rhonda Conrad       Date: Aug 04, 2023 DOB: Nov 23, 1931  Age: 89 y.o. MRN#: [id number] Attending Physician: Claudene Toribio BROCKS, MD Primary Care Physician: Arloa Elsie SAUNDERS, MD Admit Date: 07/08/2023  Reason for Consultation/Follow-up: Establishing goals of care  Subjective: Does not wake to voice or touch  Family exiting room to speak on the phone when I enter - unable to speak with family.   Spoke with RN - no concerns, patient has been comfortable.   Length of Stay: 2  Current Medications: Scheduled Meds:   dextrose        glycopyrrolate   0.4 mg Intravenous Q4H   scopolamine   1 patch Transdermal Q72H    Continuous Infusions:  morphine  15 mg/hr (04-Aug-2023 0800)   propofol  (DIPRIVAN ) infusion Stopped (07/10/23 1752)    PRN Meds: acetaminophen  (TYLENOL ) oral liquid 160 mg/5 mL, antiseptic oral rinse, dextrose , glycopyrrolate  **OR** glycopyrrolate  **OR** glycopyrrolate , LORazepam , morphine , ondansetron  **OR** ondansetron  (ZOFRAN ) IV, polyvinyl alcohol   Physical Exam Constitutional:      General: She is not in acute distress.    Appearance: She is ill-appearing.     Comments: Unresponsive Appears comfortable  Cardiovascular:     Rate and Rhythm: Normal rate.  Pulmonary:     Effort: Pulmonary effort is normal.     Comments: unlabored Skin:    General: Skin is warm and dry.             Vital Signs: BP (!) 68/43   Pulse 83   Temp 98.6 F (37 C) (Axillary)   Resp (!) 8   Ht 5' 7 (1.702 m)   Wt 71.3 kg   SpO2 (!) 81%   BMI 24.62 kg/m  SpO2: SpO2: (!) 81 % O2 Device: O2 Device: Nasal Cannula O2 Flow Rate:    Intake/output summary:  Intake/Output Summary (Last 24 hours) at 08-04-23 1046 Last data filed at 08/04/2023 0800 Gross per 24 hour   Intake 850.29 ml  Output 450 ml  Net 400.29 ml   LBM: Last BM Date :  (UTA) Baseline Weight: Weight: 75.8 kg Most recent weight: Weight: 71.3 kg       Palliative Assessment/Data:PPS 10%      Patient Active Problem List   Diagnosis Date Noted   Acute metabolic encephalopathy 07/09/2023   Hypoglycemia 07/09/2023   Aspiration pneumonia (HCC) 07/09/2023   Adenocarcinoma (HCC) 05/13/2023   Chronic kidney disease 04/19/2023   Lung nodules 03/25/2023   Axillary mass 03/25/2023   Anxiety state 06/14/2013   Chest pain 06/13/2013   DM (diabetes mellitus) (HCC) 06/13/2013   Hypertension 06/13/2013   Hyperlipidemia 06/13/2013    Palliative Care Assessment & Plan   HPI: 89 year old female with PMH as below, which is significant for DM, lung cancer, and HTN. Recently seen by Dr Shelah in the pulmonary clinic for lung nodule and underwent bronch/biopsy showing adenocarcinoma. The PMT has been asked to get involved to support additional goals of care conversations.   Assessment: Morphine  infusion continues. Patient appears comfortable - symptoms well managed. No concerns. Likely to transfer out of ICU today.   Recommendations/Plan: Continue current care - comfort measures only, morphine  infusion Anticipate  hospital death  Goals of Care and Additional Recommendations: Limitations on Scope of Treatment: Full Comfort Care  Code Status: DNR  Prognosis:  Hours - Days  Discharge Planning: Anticipated Hospital Death  Care plan was discussed with RN  Thank you for allowing the Palliative Medicine Team to assist in the care of this patient.   Total Time 25 minutes Prolonged Time Billed  no   Time spent includes: Detailed review of medical records (labs, imaging, vital signs), medically appropriate exam, discussion with treatment team, counseling and educating patient, family and/or staff, documenting clinical information, medication management and coordination of care.      *Please note that this is a verbal dictation therefore any spelling or grammatical errors are due to the Dragon Medical One system interpretation.  Tobey Jama Barnacle, DNP, [facility] Palliative Medicine Team Team Phone # [phone]  Pager [phone]

## 2023-08-03 NOTE — Progress Notes (Signed)
 Pt's time of death: 2023-07-14 at 1320   Auscultated heart and lung sound x1 min along w/Alexis W RN ... No lungs or heart sounds auscultated.    No visible chest rise seen   Notified eLink and attending MD.   Printed asystole strip and placed in chart

## 2023-08-03 NOTE — Progress Notes (Signed)
 Family at bedside.

## 2023-08-03 DEATH — deceased
